# Patient Record
Sex: Male | Born: 1955
Health system: Southern US, Community
[De-identification: ages and names within clinical notes are randomized; demographics above are authoritative.]

## PROBLEM LIST (undated history)

## (undated) DIAGNOSIS — E119 Type 2 diabetes mellitus without complications: Secondary | ICD-10-CM

## (undated) DIAGNOSIS — T7840XA Allergy, unspecified, initial encounter: Secondary | ICD-10-CM

## (undated) DIAGNOSIS — E118 Type 2 diabetes mellitus with unspecified complications: Secondary | ICD-10-CM

## (undated) DIAGNOSIS — G4730 Sleep apnea, unspecified: Secondary | ICD-10-CM

## (undated) DIAGNOSIS — E785 Hyperlipidemia, unspecified: Secondary | ICD-10-CM

## (undated) DIAGNOSIS — I10 Essential (primary) hypertension: Secondary | ICD-10-CM

## (undated) HISTORY — DX: Allergy, unspecified, initial encounter: T78.40XA

## (undated) HISTORY — DX: Sleep apnea, unspecified: G47.30

## (undated) HISTORY — PX: KNEE ARTHROSCOPY: SUR90

## (undated) HISTORY — DX: Type 2 diabetes mellitus without complications: E11.9

## (undated) HISTORY — DX: Essential (primary) hypertension: I10

## (undated) HISTORY — DX: Hyperlipidemia, unspecified: E78.5

## (undated) HISTORY — PX: COLONOSCOPY: SHX174

---

## 1985-01-17 HISTORY — PX: KNEE ARTHROSCOPY: SUR90

## 2005-04-16 ENCOUNTER — Ambulatory Visit: Payer: Self-pay | Admitting: Otolaryngology

## 2005-05-12 ENCOUNTER — Ambulatory Visit: Payer: Self-pay | Admitting: Otolaryngology

## 2006-02-21 ENCOUNTER — Ambulatory Visit: Payer: Self-pay | Admitting: Gastroenterology

## 2006-03-13 ENCOUNTER — Ambulatory Visit: Payer: Self-pay | Admitting: Gastroenterology

## 2009-02-10 ENCOUNTER — Encounter (INDEPENDENT_AMBULATORY_CARE_PROVIDER_SITE_OTHER): Payer: Self-pay | Admitting: *Deleted

## 2009-09-15 ENCOUNTER — Telehealth: Payer: Self-pay | Admitting: Gastroenterology

## 2010-02-11 ENCOUNTER — Encounter: Payer: Self-pay | Admitting: Gastroenterology

## 2010-02-16 NOTE — Letter (Signed)
Summary: Colonoscopy Letter  [facility]  [address] [place], [place] [address]   Phone: [phone]  Fax: [phone]      February 10, 2009 MRN: [id number]   [facility] [address] [place], [place]  [address]   Dear Philip Ryan,   According to your medical record, it is time for you to schedule a Colonoscopy. The American Cancer Society recommends this procedure as a method to detect early colon cancer. Patients with a family history of colon cancer, or a personal history of colon polyps or inflammatory bowel disease are at increased risk.  This letter has beeen generated based on the recommendations made at the time of your procedure. If you feel that in your particular situation this may no longer apply, please contact our office.  Please call our office at [phone] to schedule this appointment or to update your records at your earliest convenience.  Thank you for cooperating with [place] to provide you with the very best care possible.   Sincerely,  Rachael Fee, M.D.  [facility] Gastroenterology Division [phone]

## 2010-02-16 NOTE — Progress Notes (Addendum)
Summary: Schedule Colonoscopy  Phone Note Outgoing Call Call back at [facility] Phone [phone] Call back at Work Phone [phone]   Call placed by: Harlow Mares CMA Duncan Dull),  September 15, 2009 9:06 AM Call placed to: Patient Summary of Call: Left a message on patients machine to call back, patient is due for his Colonoscopy Initial call taken by: Harlow Mares CMA Duncan Dull),  September 15, 2009 9:07 AM  Follow-up for Phone Call        patient will call back when he has his schedule in front of him he is at work.  Follow-up by: Harlow Mares CMA Duncan Dull),  September 24, 2009 2:39 PM     Appended Document: Schedule Colonoscopy I SCHED THIS GUY 3-13

## 2010-02-18 NOTE — Letter (Signed)
Summary: Pre Visit Letter Revised  [facility]  [address] [place], [place] [address]   Phone: [phone]  Fax: [phone]        02/11/2010 MRN: [id number]  [facility] [address] [place], [place]  [address]             Procedure Date:  3-13 8:30am            Dr Christella Hartigan - Recall Colon   Welcome to the Gastroenterology Division at [facility].    You are scheduled to see a nurse for your pre-procedure visit on 03-15-10 at 11am on the 3rd floor at [facility], [address]  We ask that you try to arrive at our office 15 minutes prior to your appointment time to allow for check-in.  Please take a minute to review the attached form.  If you answer "Yes" to one or more of the questions on the first page, we ask that you call the person listed at your earliest opportunity.  If you answer "No" to all of the questions, please complete the rest of the form and bring it to your appointment.    Your nurse visit will consist of discussing your medical and surgical history, your immediate family medical history, and your medications.   If you are unable to list all of your medications on the form, please bring the medication bottles to your appointment and we will list them.  We will need to be aware of both prescribed and over the counter drugs.  We will need to know exact dosage information as well.    Please be prepared to read and sign documents such as consent forms, a financial agreement, and acknowledgement forms.  If necessary, and with your consent, a friend or relative is welcome to sit-in on the nurse visit with you.  Please bring your insurance card so that we may make a copy of it.  If your insurance requires a referral to see a specialist, please bring your referral form from your primary care physician.  No co-pay is required for this nurse visit.     If you cannot keep your appointment, please call [phone] to cancel or reschedule prior  to your appointment date.  This allows [place] the opportunity to schedule an appointment for another patient in need of care.    Thank you for choosing [facility] for your medical needs.  We appreciate the opportunity to care for you.  Please visit [place] at our website  to learn more about our practice.  Sincerely, The Gastroenterology Division

## 2010-03-12 ENCOUNTER — Encounter (INDEPENDENT_AMBULATORY_CARE_PROVIDER_SITE_OTHER): Payer: Self-pay | Admitting: *Deleted

## 2010-03-15 ENCOUNTER — Encounter: Payer: Self-pay | Admitting: Gastroenterology

## 2010-03-25 NOTE — Letter (Signed)
Summary: [facility] Instructions  [facility]  [address] [place], [place] [address]   Phone: [phone]  Fax: [phone]       Philip Ryan    Oct 07, 1955    MRN: [id number]        Procedure Day /Date: Tuesday 03-30-10     Arrival Time: 7:30 am     Procedure Time: 8:30 am     Location of Procedure:                    _x _  [facility] (4th Floor)                        PREPARATION FOR COLONOSCOPY WITH MOVIPREP   Starting 5 days prior to your procedure  03-25-10 do not eat nuts, seeds, popcorn, corn, beans, peas,  salads, or any raw vegetables.  Do not take any fiber supplements (e.g. Metamucil, Citrucel, and Benefiber).  THE DAY BEFORE YOUR PROCEDURE         DATE: 03-29-10   DAY: Monday   1.  Drink clear liquids the entire day-NO SOLID FOOD  2.  Do not drink anything colored red or purple.  Avoid juices with pulp.  No orange juice.  3.  Drink at least 64 oz. (8 glasses) of fluid/clear liquids during the day to prevent dehydration and help the prep work efficiently.  CLEAR LIQUIDS INCLUDE: Water Jello Ice Popsicles Tea (sugar ok, no milk/cream) Powdered fruit flavored drinks Coffee (sugar ok, no milk/cream) Gatorade Juice: apple, white grape, white cranberry  Lemonade Clear bullion, consomm, broth Carbonated beverages (any kind) Strained chicken noodle soup Hard Candy                             4.  In the morning, mix first dose of MoviPrep solution:    Empty 1 Pouch A and 1 Pouch B into the disposable container    Add lukewarm drinking water to the top line of the container. Mix to dissolve    Refrigerate (mixed solution should be used within 24 hrs)  5.  Begin drinking the prep at 5:00 p.m. The MoviPrep container is divided by 4 marks.   Every 15 minutes drink the solution down to the next mark (approximately 8 oz) until the full liter is complete.   6.  Follow completed prep with 16 oz of clear liquid of your choice  (Nothing red or purple).  Continue to drink clear liquids until bedtime.  7.  Before going to bed, mix second dose of MoviPrep solution:    Empty 1 Pouch A and 1 Pouch B into the disposable container    Add lukewarm drinking water to the top line of the container. Mix to dissolve    Refrigerate  THE DAY OF YOUR PROCEDURE      DATE:  03-30-10  DAY:  Tuesday  Beginning at  3:30 a.m. (5 hours before procedure):         1. Every 15 minutes, drink the solution down to the next mark (approx 8 oz) until the full liter is complete.  2. Follow completed prep with 16 oz. of clear liquid of your choice.    3. You may drink clear liquids until  6:30 a.m. (2 HOURS BEFORE PROCEDURE).   MEDICATION INSTRUCTIONS  Unless otherwise instructed, you should take regular prescription medications with a small sip of water  as early as possible the morning of your procedure.   Additional medication instructions: Hold Micardis HCT the morning of procedure.         OTHER INSTRUCTIONS  You will need a responsible adult at least 54 years of age to accompany you and drive you home.   This person must remain in the waiting room during your procedure.  Wear loose fitting clothing that is easily removed.  Leave jewelry and other valuables at home.  However, you may wish to bring a book to read or  an iPod/MP3 player to listen to music as you wait for your procedure to start.  Remove all body piercing jewelry and leave at home.  Total time from sign-in until discharge is approximately 2-3 hours.  You should go home directly after your procedure and rest.  You can resume normal activities the  day after your procedure.  The day of your procedure you should not:   Drive   Make legal decisions   Operate machinery   Drink alcohol   Return to work  You will receive specific instructions about eating, activities and medications before you leave.    The above instructions have been reviewed  and explained to me by   Wyona Almas RN  March 15, 2010 11:24 AM     I fully understand and can verbalize these instructions _____________________________ Date _________

## 2010-03-25 NOTE — Miscellaneous (Signed)
Summary: [facility] Previsit/prep  Clinical Lists Changes  Medications: Added new medication of MOVIPREP 100 GM  SOLR (PEG-KCL-NACL-NASULF-NA ASC-C) As per prep instructions. - Signed Rx of MOVIPREP 100 GM  SOLR (PEG-KCL-NACL-NASULF-NA ASC-C) As per prep instructions.;  #1 x 0;  Signed;  Entered by: Wyona Almas RN;  Authorized by: Rachael Fee MD;  Method used: Electronically to [facility] [phone]*, [address] [place], [place], [place]  [address], Ph: [phone] or [phone], Fax: [phone] Observations: Added new observation of NKA: T (03/15/2010 10:44)    Prescriptions: MOVIPREP 100 GM  SOLR (PEG-KCL-NACL-NASULF-NA ASC-C) As per prep instructions.  #1 x 0   Entered by:   Wyona Almas RN   Authorized by:   Rachael Fee MD   Signed by:   Wyona Almas RN on 03/15/2010   Method used:   Electronically to        [facility] [phone]* (retail)       [address] [place], [place]  [address]       Ph: [phone] or [phone]       Fax: [phone]   RxID:   [id number]

## 2010-03-30 ENCOUNTER — Other Ambulatory Visit (AMBULATORY_SURGERY_CENTER): Payer: BC Managed Care – PPO | Admitting: Gastroenterology

## 2010-03-30 ENCOUNTER — Other Ambulatory Visit: Payer: Self-pay | Admitting: Gastroenterology

## 2010-03-30 DIAGNOSIS — Z1211 Encounter for screening for malignant neoplasm of colon: Secondary | ICD-10-CM

## 2010-03-30 DIAGNOSIS — K5730 Diverticulosis of large intestine without perforation or abscess without bleeding: Secondary | ICD-10-CM

## 2010-03-30 DIAGNOSIS — D126 Benign neoplasm of colon, unspecified: Secondary | ICD-10-CM

## 2010-03-30 DIAGNOSIS — Z86010 Personal history of colonic polyps: Secondary | ICD-10-CM

## 2010-04-06 ENCOUNTER — Encounter: Payer: Self-pay | Admitting: Gastroenterology

## 2010-04-06 NOTE — Procedures (Addendum)
Summary: Colonoscopy  Patient: Philip Ryan Note: All result statuses are Final unless otherwise noted.  Tests: (1) Colonoscopy (COL)   COL Colonoscopy           DONE     [facility]     [address]     [place], [place]  [address]          COLONOSCOPY PROCEDURE REPORT          PATIENT:  Philip, Ryan  MR#:  [id number]     BIRTHDATE:  [birth year], 55 yrs. old  GENDER:  male     ENDOSCOPIST:  Rachael Fee, MD     PROCEDURE DATE:  03/30/2010     PROCEDURE:  Colonoscopy with snare polypectomy     ASA CLASS:  Class II     INDICATIONS:  history of pre-cancerous (adenomatous) colon polyps,     2008 colonoscopy     MEDICATIONS:   Fentanyl 50 mcg IV, Versed 6 mg IV          DESCRIPTION OF PROCEDURE:   After the risks benefits and     alternatives of the procedure were thoroughly explained, informed     consent was obtained.  Digital rectal exam was performed and     revealed no rectal masses.   The LB [id number] endoscope     was introduced through the anus and advanced to the cecum, which     was identified by both the appendix and ileocecal valve, without     limitations.  The quality of the prep was good, using MiraLax.     The instrument was then slowly withdrawn as the colon was fully     examined.     <<PROCEDUREIMAGES>>     FINDINGS:  Three diminutive polyps were found, all were removed     with cold snare, all were sent to pathology (jar 1). These were     located in ascending, transverse and descending segments (see     image5).  Mild diverticulosis was found in the sigmoid to     descending colon segments (see image1).  This was otherwise a     normal examination of the colon (see image3, image4, and image6).     Retroflexed views in the rectum revealed no abnormalities.    The     scope was then withdrawn from the patient and the procedure     completed.     COMPLICATIONS:  None          ENDOSCOPIC IMPRESSION:     1) Three diminutive  polyps found, all removed and all sent to     pathology     2) Mild diverticulosis in the sigmoid to descending colon     segments     3) Otherwise normal examination          RECOMMENDATIONS:     1) If the polyps removed today are proven to be adenomatous     (pre-cancerous) polyps, you will need a colonoscopy in 3 years.     You will at least need a repeat examination in 5 years given your     personal history of adenomatous polyps (2008)     2) You will receive a letter within 1-2 weeks with the results     of your biopsy as well as final recommendations. Please call my     office if you have not received a letter after 3 weeks.  ______________________________     Rachael Fee, MD          cc: Marlou Starks, MD          n.     eSIGNED:   Rachael Fee at 03/30/2010 08:51 AM          Joycie Peek, [id number]  Note: An exclamation mark (!) indicates a result that was not dispersed into the flowsheet. Document Creation Date: 03/30/2010 8:51 AM _______________________________________________________________________  (1) Order result status: Final Collection or observation date-time: 03/30/2010 08:44 Requested date-time:  Receipt date-time:  Reported date-time:  Referring Physician:   Ordering Physician: Rob Bunting [phone]) Specimen Source:  Source: Launa Grill Order Number: [id number] Lab site:   Appended Document: Colonoscopy     Procedures Next Due Date:    Colonoscopy: 03/2013

## 2010-04-15 NOTE — Letter (Signed)
Summary: Results Letter  [facility]  [address] [place], [place] [address]   Phone: [phone]  Fax: [phone]        April 06, 2010 MRN: [id number]    [facility] [address] [place], [place]  [address]    Dear Philip Ryan,   The polyp(s) removed during your recent procedure were proven to be adenomatous.  These are pre-cancerous polyps that may have grown into cancers if they had not been removed.  Based on current nationally recognized surveillance guidelines, I recommend that you have a repeat colonoscopy in 3 years.   We will therefore put your information in our reminder system and will contact you in 3 years to schedule a repeat procedure.  Please call if you have any questions or concerns.       Sincerely,  Rachael Fee MD  This letter has been electronically signed by your physician.  Appended Document: Results Letter letter mailed

## 2011-08-10 ENCOUNTER — Ambulatory Visit: Payer: Self-pay | Admitting: Family Medicine

## 2011-08-18 ENCOUNTER — Ambulatory Visit: Payer: Self-pay | Admitting: Family Medicine

## 2013-02-08 ENCOUNTER — Encounter: Payer: Self-pay | Admitting: Gastroenterology

## 2013-09-19 ENCOUNTER — Encounter: Payer: Self-pay | Admitting: Gastroenterology

## 2013-12-16 ENCOUNTER — Encounter: Payer: Self-pay | Admitting: Gastroenterology

## 2014-02-24 ENCOUNTER — Ambulatory Visit (AMBULATORY_SURGERY_CENTER): Payer: Self-pay | Admitting: *Deleted

## 2014-02-24 VITALS — Ht 70.5 in | Wt 225.0 lb

## 2014-02-24 DIAGNOSIS — Z86010 Personal history of colonic polyps: Secondary | ICD-10-CM

## 2014-02-24 MED ORDER — MOVIPREP 100 G PO SOLR: ORAL | Status: DC

## 2014-02-24 NOTE — Progress Notes (Signed)
Patient denies any allergies to eggs or soy. Patient denies any problems with anesthesia/sedation. Patient denies any oxygen use at home and does not take any diet/weight loss medications. Patient does not want EMMI information at this time.

## 2014-03-10 ENCOUNTER — Ambulatory Visit (AMBULATORY_SURGERY_CENTER): Payer: BLUE CROSS/BLUE SHIELD | Admitting: Gastroenterology

## 2014-03-10 ENCOUNTER — Encounter: Payer: Self-pay | Admitting: Gastroenterology

## 2014-03-10 VITALS — BP 136/83 | HR 92 | Temp 96.8°F | Resp 14 | Ht 70.5 in | Wt 225.0 lb

## 2014-03-10 DIAGNOSIS — Z86010 Personal history of colonic polyps: Secondary | ICD-10-CM

## 2014-03-10 DIAGNOSIS — D124 Benign neoplasm of descending colon: Secondary | ICD-10-CM

## 2014-03-10 MED ORDER — SODIUM CHLORIDE 0.9 % IV SOLN: 500.0000 mL | INTRAVENOUS | Status: DC

## 2014-03-10 NOTE — Patient Instructions (Signed)
YOU HAD AN ENDOSCOPIC PROCEDURE TODAY AT THE [facility]: Refer to the procedure report that was given to you for any specific questions about what was found during the examination.  If the procedure report does not answer your questions, please call your gastroenterologist to clarify.  If you requested that your care partner not be given the details of your procedure findings, then the procedure report has been included in a sealed envelope for you to review at your convenience later.  YOU SHOULD EXPECT: Some feelings of bloating in the abdomen. Passage of more gas than usual.  Walking can help get rid of the air that was put into your GI tract during the procedure and reduce the bloating. If you had a lower endoscopy (such as a colonoscopy or flexible sigmoidoscopy) you may notice spotting of blood in your stool or on the toilet paper. If you underwent a bowel prep for your procedure, then you may not have a normal bowel movement for a few days.  DIET: Your first meal following the procedure should be a light meal and then it is ok to progress to your normal diet.  A half-sandwich or bowl of soup is an example of a good first meal.  Heavy or fried foods are harder to digest and may make you feel nauseous or bloated.  Likewise meals heavy in dairy and vegetables can cause extra gas to form and this can also increase the bloating.  Drink plenty of fluids but you should avoid alcoholic beverages for 24 hours.  ACTIVITY: Your care partner should take you home directly after the procedure.  You should plan to take it easy, moving slowly for the rest of the day.  You can resume normal activity the day after the procedure however you should NOT DRIVE or use heavy machinery for 24 hours (because of the sedation medicines used during the test).    SYMPTOMS TO REPORT IMMEDIATELY: A gastroenterologist can be reached at any hour.  During normal business hours, 8:30 AM to 5:00 PM Monday through Friday,  call [phone].  After hours and on weekends, please call the GI answering service at [phone] who will take a message and have the physician on call contact you.   Following lower endoscopy (colonoscopy or flexible sigmoidoscopy):  Excessive amounts of blood in the stool  Significant tenderness or worsening of abdominal pains  Swelling of the abdomen that is new, acute  Fever of 100F or higher  FOLLOW UP: If any biopsies were taken you will be contacted by phone or by letter within the next 1-3 weeks.  Call your gastroenterologist if you have not heard about the biopsies in 3 weeks.  Our staff will call the home number listed on your records the next business day following your procedure to check on you and address any questions or concerns that you may have at that time regarding the information given to you following your procedure. This is a courtesy call and so if there is no answer at the home number and we have not heard from you through the emergency physician on call, we will assume that you have returned to your regular daily activities without incident.  SIGNATURES/CONFIDENTIALITY: You and/or your care partner have signed paperwork which will be entered into your electronic medical record.  These signatures attest to the fact that that the information above on your After Visit Summary has been reviewed and is understood.  Full responsibility of the confidentiality of this   discharge information lies with you and/or your care-partner.  Polyps-handout given

## 2014-03-10 NOTE — Op Note (Signed)
[place]  [address] [place], [id number]   COLONOSCOPY PROCEDURE REPORT  PATIENT: Philip Ryan  MR#: [id number] BIRTHDATE: 04/19/1955 , 59  yrs. old GENDER: male ENDOSCOPIST: Milus Banister PROCEDURE DATE:  03/10/2014 PROCEDURE:   Colonoscopy with snare polypectomy First Screening Colonoscopy - Avg.  risk and is 50 yrs.  old or older - No.  Prior Negative Screening - Now for repeat screening. N/A  History of Adenoma - Now for follow-up colonoscopy & has been > or = to 3 yrs.  Yes hx of adenoma.  Has been 3 or more years since last colonoscopy.  Polyps Removed Today? Yes. ASA CLASS:   Class II INDICATIONS:adenomatous polyps including 3 small adenomas removed 2013. MEDICATIONS: Monitored anesthesia care, Propofol 200 mg IV, and lidocaine 200mg  IV  DESCRIPTION OF PROCEDURE:   After the risks benefits and alternatives of the procedure were thoroughly explained, informed consent was obtained.  The digital rectal exam revealed no abnormalities of the rectum.   The LB [id number]  endoscope was introduced through the anus and advanced to the cecum, which was identified by both the appendix and ileocecal valve. No adverse events experienced.   The quality of the prep was excellent.  The instrument was then slowly withdrawn as the colon was fully examined.      COLON FINDINGS: One polyp was found, removed and sent to pathology. This was 60mm, sessile, located in desceding colon, removed with cold snare.  The examination was otherwise normal.  Retroflexed views revealed no abnormalities. The time to cecum=3 minutes 57 seconds.  Withdrawal time=10 minutes 51 seconds.  The scope was withdrawn and the procedure completed. COMPLICATIONS: There were no immediate complications.  ENDOSCOPIC IMPRESSION: As above  RECOMMENDATIONS: If the polyp(s) removed today are proven to be adenomatous (pre-cancerous) polyps, you will need a repeat colonoscopy  in 5 years.   You will receive a letter within 1-2 weeks with the results of your biopsy as well as final recommendations.  Please call my office if you have not received a letter after 3 weeks.  eSigned:  Milus Banister, MD 03/10/2014 9:00 AM   cc: Golden Pop, MD

## 2014-03-10 NOTE — Op Note (Signed)
[facility]  [address] [place], [id number]   COLONOSCOPY PROCEDURE REPORT  PATIENT: Philip Ryan, Philip Ryan  MR#: [id number] BIRTHDATE: 09-Nov-1955 , 64  yrs. old GENDER: male ENDOSCOPIST: Milus Banister, MD REFERRED EX:HBZJIRC Raymon Mutton, M.D. PROCEDURE DATE:  03/10/2014 PROCEDURE:   Colonoscopy with biopsy First Screening Colonoscopy - Avg.  risk and is 50 yrs.  old or older Yes.  Prior Negative Screening - Now for repeat screening. N/A  History of Adenoma - Now for follow-up colonoscopy & has been > or = to 3 yrs.  N/A  Polyps Removed Today? Yes. ASA CLASS:   Class II INDICATIONS:average risk patient for colon cancer. MEDICATIONS: Monitored anesthesia care and Propofol 200 mg IV  DESCRIPTION OF PROCEDURE:   After the risks benefits and alternatives of the procedure were thoroughly explained, informed consent was obtained.  The digital rectal exam revealed no abnormalities of the rectum.   The LB [id number]  endoscope was introduced through the anus and advanced to the cecum, which was identified by both the appendix and ileocecal valve. No adverse events experienced.   The quality of the prep was excellent.  The instrument was then slowly withdrawn as the colon was fully examined.   COLON FINDINGS: One polyp was found, removed and sent to pathology. This was sessile, 81mm across, located in cecum, removed with biopsy.  The examination was otherwise normal.  Retroflexed views revealed no abnormalities. The time to cecum=2 minutes 24 seconds. Withdrawal time=6 minutes 42 seconds.  The scope was withdrawn and the procedure completed. COMPLICATIONS: There were no immediate complications.  ENDOSCOPIC IMPRESSION: One polyp was found, removed and sent to pathology. The examination was otherwise normal  RECOMMENDATIONS: If the polyp(s) removed today are proven to be adenomatous (pre-cancerous) polyps, you will need a repeat colonoscopy in 5 years.  Otherwise  you should continue to follow colorectal cancer screening guidelines for "routine risk" patients with colonoscopy in 10 years.  You will receive a letter within 1-2 weeks with the results of your biopsy as well as final recommendations.  Please call my office if you have not received a letter after 3 weeks.  eSigned:  Milus Banister, MD 03/10/2014 8:15 AM

## 2014-03-10 NOTE — Progress Notes (Signed)
Called to room to assist during endoscopic procedure.  Patient ID and intended procedure confirmed with present staff. Received instructions for my participation in the procedure from the performing physician.  

## 2014-03-10 NOTE — Progress Notes (Signed)
Report to PACU, RN, vss, BBS= Clear.  

## 2014-03-11 ENCOUNTER — Telehealth: Payer: Self-pay

## 2014-03-11 NOTE — Telephone Encounter (Signed)
  Follow up Call-  Call back number 03/10/2014  Post procedure Call Back phone  # [phone]  Permission to leave phone message Yes     Patient questions:  Do you have a fever, pain , or abdominal swelling? No. Pain Score  0 *  Have you tolerated food without any problems? Yes.    Have you been able to return to your normal activities? Yes.    Do you have any questions about your discharge instructions: Diet   No. Medications  No. Follow up visit  No.  Do you have questions or concerns about your Care? No.  Actions: * If pain score is 4 or above: No action needed, pain <4.

## 2014-03-18 ENCOUNTER — Encounter: Payer: Self-pay | Admitting: Gastroenterology

## 2014-07-23 DIAGNOSIS — G4730 Sleep apnea, unspecified: Secondary | ICD-10-CM

## 2014-07-23 DIAGNOSIS — E119 Type 2 diabetes mellitus without complications: Secondary | ICD-10-CM

## 2014-07-23 DIAGNOSIS — J309 Allergic rhinitis, unspecified: Secondary | ICD-10-CM

## 2014-07-23 DIAGNOSIS — E785 Hyperlipidemia, unspecified: Secondary | ICD-10-CM

## 2014-07-23 DIAGNOSIS — E1169 Type 2 diabetes mellitus with other specified complication: Secondary | ICD-10-CM | POA: Insufficient documentation

## 2014-07-23 DIAGNOSIS — I10 Essential (primary) hypertension: Secondary | ICD-10-CM

## 2014-07-28 ENCOUNTER — Ambulatory Visit (INDEPENDENT_AMBULATORY_CARE_PROVIDER_SITE_OTHER): Payer: BLUE CROSS/BLUE SHIELD | Admitting: Family Medicine

## 2014-07-28 ENCOUNTER — Encounter: Payer: Self-pay | Admitting: Family Medicine

## 2014-07-28 VITALS — BP 118/76 | HR 120 | Temp 98.6°F | Ht 70.0 in | Wt 220.0 lb

## 2014-07-28 DIAGNOSIS — I10 Essential (primary) hypertension: Secondary | ICD-10-CM

## 2014-07-28 DIAGNOSIS — E119 Type 2 diabetes mellitus without complications: Secondary | ICD-10-CM | POA: Diagnosis not present

## 2014-07-28 DIAGNOSIS — E785 Hyperlipidemia, unspecified: Secondary | ICD-10-CM

## 2014-07-28 LAB — LIPID PANEL PICCOLO, WAIVED
CHOL/HDL RATIO PICCOLO,WAIVE: 4.5 mg/dL
Cholesterol Piccolo, Waived: 192 mg/dL (ref <200)
HDL Chol Piccolo, Waived: 43 mg/dL — ABNORMAL LOW (ref >59)
LDL CHOL CALC PICCOLO WAIVED: 101 mg/dL — AB (ref <100)
Triglycerides Piccolo,Waived: 240 mg/dL — ABNORMAL HIGH (ref <150)
VLDL Chol Calc Piccolo,Waive: 48 mg/dL — ABNORMAL HIGH (ref <30)

## 2014-07-28 LAB — BAYER DCA HB A1C WAIVED: HB A1C (BAYER DCA - WAIVED): 7.2 % — ABNORMAL HIGH (ref <7.0)

## 2014-07-28 NOTE — Assessment & Plan Note (Addendum)
The current medical regimen is effective;  continue present plan and medications. Discuss needs better control will work on diet for control

## 2014-07-28 NOTE — Progress Notes (Signed)
   BP 118/76 mmHg  Pulse 120  Temp(Src) 98.6 F (37 C)  Ht 5\' 10"  (1.778 m)  Wt 220 lb (99.791 kg)  BMI 31.57 kg/m2  SpO2 96%   Subjective:    Patient ID: Philip Ryan., male    DOB: 03-14-1955, 59 y.o.   MRN: [id number]  HPI: Philip Ryan. is a 59 y.o. male  Chief Complaint  Patient presents with  . Diabetes  . Hyperlipidemia    patient NOT fasting   doing well BP meds and others no side effects Takes every day Things are up a little as just got off vacation  Relevant past medical, surgical, family and social history reviewed and updated as indicated. Interim medical history since our last visit reviewed. Allergies and medications reviewed and updated.  Review of Systems  Constitutional: Negative.   Respiratory: Negative.   Cardiovascular: Negative.   Musculoskeletal:       No edema    Per HPI unless specifically indicated above     Objective:    BP 118/76 mmHg  Pulse 120  Temp(Src) 98.6 F (37 C)  Ht 5\' 10"  (1.778 m)  Wt 220 lb (99.791 kg)  BMI 31.57 kg/m2  SpO2 96%  Wt Readings from Last 3 Encounters:  07/28/14 220 lb (99.791 kg)  04/29/14 216 lb (97.977 kg)  03/10/14 225 lb (102.059 kg)    Physical Exam  Constitutional: He is oriented to person, place, and time. He appears well-developed and well-nourished. No distress.  HENT:  Head: Normocephalic and atraumatic.  Right Ear: Hearing normal.  Left Ear: Hearing normal.  Nose: Nose normal.  Eyes: Conjunctivae and lids are normal. Right eye exhibits no discharge. Left eye exhibits no discharge. No scleral icterus.  Cardiovascular: Normal rate, regular rhythm and normal heart sounds.   Pulmonary/Chest: Effort normal and breath sounds normal. No respiratory distress.  Musculoskeletal: Normal range of motion.  Neurological: He is alert and oriented to person, place, and time.  Skin: Skin is intact. No rash noted.  Psychiatric: He has a normal mood and affect. His speech is normal and  behavior is normal. Judgment and thought content normal. Cognition and memory are normal.    No results found for this or any previous visit.    Assessment & Plan:   Problem List Items Addressed This Visit      Cardiovascular and Mediastinum   Hypertension    The current medical regimen is effective;  continue present plan and medications.         Endocrine   Diabetes mellitus    The current medical regimen is effective;  continue present plan and medications. Discuss needs better control will work on diet for control        Other Visit Diagnoses    Diabetes mellitus without complication    -  Primary    Relevant Orders    Bayer DCA Hb A1c Waived    Hyperlipemia        Relevant Orders    Lipid Panel Piccolo, Waived        Follow up plan: Return in about 3 months (around 10/28/2014) for DM and check  A1C.

## 2014-07-28 NOTE — Assessment & Plan Note (Signed)
The current medical regimen is effective;  continue present plan and medications.  

## 2014-09-10 ENCOUNTER — Other Ambulatory Visit: Payer: Self-pay | Admitting: Family Medicine

## 2014-09-10 ENCOUNTER — Ambulatory Visit (INDEPENDENT_AMBULATORY_CARE_PROVIDER_SITE_OTHER): Payer: BLUE CROSS/BLUE SHIELD

## 2014-09-10 ENCOUNTER — Telehealth: Payer: Self-pay | Admitting: Family Medicine

## 2014-09-10 DIAGNOSIS — J301 Allergic rhinitis due to pollen: Secondary | ICD-10-CM

## 2014-09-10 DIAGNOSIS — J302 Other seasonal allergic rhinitis: Secondary | ICD-10-CM

## 2014-09-10 MED ORDER — TRIAMCINOLONE ACETONIDE 40 MG/ML IJ SUSP: 40.0000 mg | Freq: Once | INTRAMUSCULAR | Status: DC

## 2014-09-10 MED ORDER — TRIAMCINOLONE ACETONIDE 40 MG/ML IJ SUSP
40.0000 mg | Freq: Once | INTRAMUSCULAR | Status: AC
  Administered 2014-09-10: 40 mg via INTRAMUSCULAR

## 2014-09-10 NOTE — Telephone Encounter (Signed)
Do we?

## 2014-09-10 NOTE — Telephone Encounter (Signed)
Pt added to schedule today @ 1pm

## 2014-09-10 NOTE — Telephone Encounter (Signed)
Pt called stated he is due for his allergy shot, wants to know if we have it in stock. Please advise and I will call pt back. Thanks.

## 2014-10-01 LAB — HM DIABETES EYE EXAM

## 2014-10-28 ENCOUNTER — Encounter: Payer: Self-pay | Admitting: Family Medicine

## 2014-10-28 ENCOUNTER — Ambulatory Visit (INDEPENDENT_AMBULATORY_CARE_PROVIDER_SITE_OTHER): Payer: BLUE CROSS/BLUE SHIELD | Admitting: Family Medicine

## 2014-10-28 VITALS — BP 128/78 | HR 86 | Temp 99.1°F | Ht 71.0 in | Wt 217.0 lb

## 2014-10-28 DIAGNOSIS — E785 Hyperlipidemia, unspecified: Secondary | ICD-10-CM

## 2014-10-28 DIAGNOSIS — E119 Type 2 diabetes mellitus without complications: Secondary | ICD-10-CM

## 2014-10-28 DIAGNOSIS — I10 Essential (primary) hypertension: Secondary | ICD-10-CM

## 2014-10-28 DIAGNOSIS — Z23 Encounter for immunization: Secondary | ICD-10-CM | POA: Diagnosis not present

## 2014-10-28 LAB — BAYER DCA HB A1C WAIVED: HB A1C (BAYER DCA - WAIVED): 7 % — ABNORMAL HIGH (ref <7.0)

## 2014-10-28 MED ORDER — TELMISARTAN 80 MG PO TABS
80.0000 mg | ORAL_TABLET | Freq: Every day | ORAL | Status: DC
Start: 2014-10-28 — End: 2015-06-11

## 2014-10-28 MED ORDER — AMLODIPINE BESYLATE 10 MG PO TABS: 10.0000 mg | ORAL_TABLET | Freq: Every day | ORAL | Status: DC

## 2014-10-28 MED ORDER — METFORMIN HCL 500 MG PO TABS: 1000.0000 mg | ORAL_TABLET | Freq: Two times a day (BID) | ORAL | Status: DC

## 2014-10-28 NOTE — Assessment & Plan Note (Signed)
The current medical regimen is effective;  continue present plan and medications.  

## 2014-10-28 NOTE — Progress Notes (Signed)
BP 128/78 mmHg  Pulse 86  Temp(Src) 99.1 F (37.3 C)  Ht 5\' 11"  (1.803 m)  Wt 217 lb (98.431 kg)  BMI 30.28 kg/m2  SpO2 98%   Subjective:    Patient ID: Philip Ryan., male    DOB: Nov 20, 1955, 59 y.o.   MRN: [id number]  HPI: Philip Ryan. is a 59 y.o. male  Chief Complaint  Patient presents with  . Diabetes   patient doing well with diabetes noted low blood sugar spells no complaints from medications  Blood pressure doing well with good control no side effects and takes faithfully.  Relevant past medical, surgical, family and social history reviewed and updated as indicated. Interim medical history since our last visit reviewed. Allergies and medications reviewed and updated.  Review of Systems  Constitutional: Negative.   Respiratory: Negative.   Cardiovascular: Negative.     Per HPI unless specifically indicated above     Objective:    BP 128/78 mmHg  Pulse 86  Temp(Src) 99.1 F (37.3 C)  Ht 5\' 11"  (1.803 m)  Wt 217 lb (98.431 kg)  BMI 30.28 kg/m2  SpO2 98%  Wt Readings from Last 3 Encounters:  10/28/14 217 lb (98.431 kg)  07/28/14 220 lb (99.791 kg)  04/29/14 216 lb (97.977 kg)    Physical Exam  Constitutional: He is oriented to person, place, and time. He appears well-developed and well-nourished. No distress.  HENT:  Head: Normocephalic and atraumatic.  Right Ear: Hearing normal.  Left Ear: Hearing normal.  Nose: Nose normal.  Eyes: Conjunctivae and lids are normal. Right eye exhibits no discharge. Left eye exhibits no discharge. No scleral icterus.  Cardiovascular: Normal rate, regular rhythm and normal heart sounds.   Pulmonary/Chest: Effort normal and breath sounds normal. No respiratory distress.  Musculoskeletal: Normal range of motion.  Neurological: He is alert and oriented to person, place, and time.  Skin: Skin is intact. No rash noted.  Psychiatric: He has a normal mood and affect. His speech is normal and behavior is  normal. Judgment and thought content normal. Cognition and memory are normal.    Results for orders placed or performed in visit on 07/28/14  Lipid Panel Piccolo, [employer]  Result Value Ref Range   Cholesterol Piccolo, Waived 192 <200 mg/dL   HDL Chol Piccolo, Waived 43 (L) >59 mg/dL   Triglycerides Piccolo,Waived 240 (H) <150 mg/dL   Chol/HDL Ratio Piccolo,Waive 4.5 mg/dL   LDL Chol Calc Piccolo Waived 101 (H) <100 mg/dL   VLDL Chol Calc Piccolo,Waive 48 (H) <30 mg/dL  Bayer DCA Hb A1c Waived  Result Value Ref Range   Bayer DCA Hb A1c Waived 7.2 (H) <7.0 %      Assessment & Plan:   Problem List Items Addressed This Visit      Cardiovascular and Mediastinum   Hypertension    The current medical regimen is effective;  continue present plan and medications.       Relevant Medications   aspirin EC 81 MG tablet   amLODipine (NORVASC) 10 MG tablet   telmisartan (MICARDIS) 80 MG tablet     Endocrine   Diabetes mellitus ([place])    The current medical regimen is effective;  continue present plan and medications.       Relevant Medications   aspirin EC 81 MG tablet   metFORMIN (GLUCOPHAGE) 500 MG tablet   telmisartan (MICARDIS) 80 MG tablet     Other   Hyperlipidemia    The current  medical regimen is effective;  continue present plan and medications.       Relevant Medications   aspirin EC 81 MG tablet   amLODipine (NORVASC) 10 MG tablet   telmisartan (MICARDIS) 80 MG tablet    Other Visit Diagnoses    Diabetes mellitus without complication (HCC)    -  Primary    Relevant Medications    aspirin EC 81 MG tablet    metFORMIN (GLUCOPHAGE) 500 MG tablet    telmisartan (MICARDIS) 80 MG tablet    Other Relevant Orders    Bayer DCA Hb A1c Waived    Immunization due        Relevant Orders    Flu Vaccine QUAD 36+ mos PF IM (Fluarix & Fluzone Quad PF) (Completed)        Follow up plan: Return in about 3 months (around 01/28/2015), or if symptoms worsen or fail to improve,  for a1c bmp.

## 2015-02-17 ENCOUNTER — Encounter: Payer: Self-pay | Admitting: Family Medicine

## 2015-02-17 ENCOUNTER — Ambulatory Visit (INDEPENDENT_AMBULATORY_CARE_PROVIDER_SITE_OTHER): Payer: BLUE CROSS/BLUE SHIELD | Admitting: Family Medicine

## 2015-02-17 VITALS — BP 131/86 | HR 92 | Temp 98.9°F | Ht 69.8 in | Wt 213.0 lb

## 2015-02-17 DIAGNOSIS — E785 Hyperlipidemia, unspecified: Secondary | ICD-10-CM

## 2015-02-17 DIAGNOSIS — I10 Essential (primary) hypertension: Secondary | ICD-10-CM | POA: Diagnosis not present

## 2015-02-17 DIAGNOSIS — E119 Type 2 diabetes mellitus without complications: Secondary | ICD-10-CM | POA: Diagnosis not present

## 2015-02-17 LAB — BAYER DCA HB A1C WAIVED: HB A1C: 8 % — AB (ref <7.0)

## 2015-02-17 NOTE — Assessment & Plan Note (Signed)
Discussed diabetes poor control today discussed diet exercise nutrition Patient will do better with lifestyle and if glucose not coming down we'll notify us before his physical in April for further medication.

## 2015-02-17 NOTE — Assessment & Plan Note (Signed)
The current medical regimen is effective;  continue present plan and medications.  

## 2015-02-17 NOTE — Progress Notes (Signed)
BP 131/86 mmHg  Pulse 92  Temp(Src) 98.9 F (37.2 C)  Ht 5' 9.8" (1.773 m)  Wt 213 lb (96.616 kg)  BMI 30.73 kg/m2  SpO2 98%   Subjective:    Patient ID: Philip Dunn., male    DOB: 17-May-1955, 60 y.o.   MRN: [id number]  HPI: Philip Ryan. is a 60 y.o. male  Chief Complaint  Patient presents with  . Hypertension  . Hyperlipidemia  . Diabetes   recheck medical problems doing well with blood pressure good control no complaints Cholesterol doing well Diabetes doing well not checking blood sugar had a big holiday season with eating extra but all in all is been trying to work on weight loss had gained some but is now back down 7 pounds from this summer. Taking medications faithfully with no side effects and noted low blood sugar spells. Relevant past medical, surgical, family and social history reviewed and updated as indicated. Interim medical history since our last visit reviewed. Allergies and medications reviewed and updated.  Review of Systems  Constitutional: Negative.   Respiratory: Negative.   Cardiovascular: Negative.     Per HPI unless specifically indicated above     Objective:    BP 131/86 mmHg  Pulse 92  Temp(Src) 98.9 F (37.2 C)  Ht 5' 9.8" (1.773 m)  Wt 213 lb (96.616 kg)  BMI 30.73 kg/m2  SpO2 98%  Wt Readings from Last 3 Encounters:  02/17/15 213 lb (96.616 kg)  10/28/14 217 lb (98.431 kg)  07/28/14 220 lb (99.791 kg)    Physical Exam  Constitutional: He is oriented to person, place, and time. He appears well-developed and well-nourished. No distress.  HENT:  Head: Normocephalic and atraumatic.  Right Ear: Hearing normal.  Left Ear: Hearing normal.  Nose: Nose normal.  Eyes: Conjunctivae and lids are normal. Right eye exhibits no discharge. Left eye exhibits no discharge. No scleral icterus.  Cardiovascular: Normal rate, regular rhythm and normal heart sounds.   Pulmonary/Chest: Effort normal and breath sounds normal. No  respiratory distress.  Musculoskeletal: Normal range of motion.  Neurological: He is alert and oriented to person, place, and time.  Skin: Skin is intact. No rash noted.  Psychiatric: He has a normal mood and affect. His speech is normal and behavior is normal. Judgment and thought content normal. Cognition and memory are normal.    Results for orders placed or performed in visit on 10/28/14  Bayer DCA Hb A1c Waived  Result Value Ref Range   Bayer DCA Hb A1c Waived 7.0 (H) <7.0 %      Assessment & Plan:   Problem List Items Addressed This Visit      Cardiovascular and Mediastinum   Hypertension    The current medical regimen is effective;  continue present plan and medications.         Endocrine   Diabetes mellitus ([place])    Discussed diabetes poor control today discussed diet exercise nutrition Patient will do better with lifestyle and if glucose not coming down we'll notify us before his physical in April for further medication.        Other   Hyperlipidemia    The current medical regimen is effective;  continue present plan and medications.        Other Visit Diagnoses    Diabetes mellitus without complication ([place])    -  Primary    Relevant Orders    Bayer DCA Hb A1c Waived  Essential hypertension, benign        Relevant Orders    Basic metabolic panel        Follow up plan: Return in about 3 months (around 05/17/2015) for Physical Exam and A1C.

## 2015-02-18 ENCOUNTER — Encounter: Payer: Self-pay | Admitting: Family Medicine

## 2015-02-18 LAB — BASIC METABOLIC PANEL
BUN/Creatinine Ratio: 13 (ref 9–20)
BUN: 10 mg/dL (ref 6–24)
CO2: 24 mmol/L (ref 18–29)
Calcium: 9.1 mg/dL (ref 8.7–10.2)
Chloride: 96 mmol/L (ref 96–106)
Creatinine, Ser: 0.76 mg/dL (ref 0.76–1.27)
GFR calc Af Amer: 116 mL/min/{1.73_m2} (ref >59)
GFR calc non Af Amer: 101 mL/min/{1.73_m2} (ref >59)
Glucose: 187 mg/dL — ABNORMAL HIGH (ref 65–99)
Potassium: 4.3 mmol/L (ref 3.5–5.2)
Sodium: 135 mmol/L (ref 134–144)

## 2015-04-30 ENCOUNTER — Encounter: Payer: BLUE CROSS/BLUE SHIELD | Admitting: Family Medicine

## 2015-05-26 ENCOUNTER — Ambulatory Visit (INDEPENDENT_AMBULATORY_CARE_PROVIDER_SITE_OTHER): Payer: BLUE CROSS/BLUE SHIELD | Admitting: Unknown Physician Specialty

## 2015-05-26 ENCOUNTER — Encounter: Payer: Self-pay | Admitting: Unknown Physician Specialty

## 2015-05-26 VITALS — BP 152/94 | HR 96 | Temp 98.2°F | Ht 69.6 in | Wt 217.4 lb

## 2015-05-26 DIAGNOSIS — J069 Acute upper respiratory infection, unspecified: Secondary | ICD-10-CM | POA: Diagnosis not present

## 2015-05-26 MED ORDER — FLUTICASONE PROPIONATE 50 MCG/ACT NA SUSP: 2.0000 | Freq: Every day | NASAL | Status: DC

## 2015-05-26 NOTE — Progress Notes (Signed)
+  BP 152/94 mmHg  Pulse 96  Temp(Src) 98.2 F (36.8 C)  Ht 5' 9.6" (1.768 m)  Wt 217 lb 6.4 oz (98.612 kg)  BMI 31.55 kg/m2  SpO2 96%   Subjective:    Patient ID: Philip Dunn., male    DOB: 12-17-1955, 60 y.o.   MRN: [id number]  HPI: Philip Goodlow. is a 60 y.o. male  Chief Complaint  Patient presents with  . URI    pt states he has had some congestion for about 2 weeks now and has tried many OTC medications   URI  This is a new problem. The current episode started 1 to 4 weeks ago. The problem has been gradually improving. There has been no fever. Associated symptoms include congestion, coughing and headaches. Pertinent negatives include no neck pain, sinus pain or sore throat. He has tried nothing for the symptoms.     Relevant past medical, surgical, family and social history reviewed and updated as indicated. Interim medical history since our last visit reviewed. Allergies and medications reviewed and updated.  Review of Systems  HENT: Positive for congestion. Negative for sore throat.   Respiratory: Positive for cough.   Musculoskeletal: Negative for neck pain.  Neurological: Positive for headaches.    Per HPI unless specifically indicated above     Objective:    BP 152/94 mmHg  Pulse 96  Temp(Src) 98.2 F (36.8 C)  Ht 5' 9.6" (1.768 m)  Wt 217 lb 6.4 oz (98.612 kg)  BMI 31.55 kg/m2  SpO2 96%  Wt Readings from Last 3 Encounters:  05/26/15 217 lb 6.4 oz (98.612 kg)  02/17/15 213 lb (96.616 kg)  10/28/14 217 lb (98.431 kg)    Physical Exam  Constitutional: He is oriented to person, place, and time. He appears well-developed and well-nourished. No distress.  HENT:  Head: Normocephalic and atraumatic.  Right Ear: Tympanic membrane and ear canal normal.  Left Ear: Tympanic membrane and ear canal normal.  Nose: Rhinorrhea present. Right sinus exhibits no maxillary sinus tenderness and no frontal sinus tenderness. Left sinus exhibits no  maxillary sinus tenderness and no frontal sinus tenderness.  Mouth/Throat: Uvula is midline. Posterior oropharyngeal edema present.  Eyes: Conjunctivae and lids are normal. Right eye exhibits no discharge. Left eye exhibits no discharge. No scleral icterus.  Neck: Neck supple.  Cardiovascular: Normal rate, regular rhythm and normal heart sounds.   Pulmonary/Chest: Effort normal and breath sounds normal. No respiratory distress.  Abdominal: Normal appearance. There is no splenomegaly or hepatomegaly.  Musculoskeletal: Normal range of motion.  Neurological: He is alert and oriented to person, place, and time.  Skin: Skin is warm, dry and intact. No rash noted. No pallor.  Psychiatric: He has a normal mood and affect. His behavior is normal. Judgment and thought content normal.  Nursing note and vitals reviewed.   Results for orders placed or performed in visit on 02/17/15  Bayer DCA Hb A1c Waived  Result Value Ref Range   Bayer DCA Hb A1c Waived 8.0 (H) Q000111Q %  Basic metabolic panel  Result Value Ref Range   Glucose 187 (H) 65 - 99 mg/dL   BUN 10 6 - 24 mg/dL   Creatinine, Ser 0.76 0.76 - 1.27 mg/dL   GFR calc non Af Amer 101 >59 mL/min/1.73   GFR calc Af Amer 116 >59 mL/min/1.73   BUN/Creatinine Ratio 13 9 - 20   Sodium 135 134 - 144 mmol/L   Potassium 4.3 3.5 -  5.2 mmol/L   Chloride 96 96 - 106 mmol/L   CO2 24 18 - 29 mmol/L   Calcium 9.1 8.7 - 10.2 mg/dL      Assessment & Plan:   Problem List Items Addressed This Visit    None    Visit Diagnoses    URI (upper respiratory infection)    -  Primary    supportive care.  Rx for flonase.  Pt ed.        Follow up plan: Return if symptoms worsen or fail to improve.

## 2015-05-26 NOTE — Patient Instructions (Signed)
Viral Infections °A viral infection can be caused by different types of viruses. Most viral infections are not serious and resolve on their own. However, some infections may cause severe symptoms and may lead to further complications. °SYMPTOMS °Viruses can frequently cause: °· Minor sore throat. °· Aches and pains. °· Headaches. °· Runny nose. °· Different types of rashes. °· Watery eyes. °· Tiredness. °· Cough. °· Loss of appetite. °· Gastrointestinal infections, resulting in nausea, vomiting, and diarrhea. °These symptoms do not respond to antibiotics because the infection is not caused by bacteria. However, you might catch a bacterial infection following the viral infection. This is sometimes called a "superinfection." Symptoms of such a bacterial infection may include: °· Worsening sore throat with pus and difficulty swallowing. °· Swollen neck glands. °· Chills and a high or persistent fever. °· Severe headache. °· Tenderness over the sinuses. °· Persistent overall ill feeling (malaise), muscle aches, and tiredness (fatigue). °· Persistent cough. °· Yellow, green, or brown mucus production with coughing. °HOME CARE INSTRUCTIONS  °· Only take over-the-counter or prescription medicines for pain, discomfort, diarrhea, or fever as directed by your caregiver. °· Drink enough water and fluids to keep your urine clear or pale yellow. Sports drinks can provide valuable electrolytes, sugars, and hydration. °· Get plenty of rest and maintain proper nutrition. Soups and broths with crackers or rice are fine. °SEEK IMMEDIATE MEDICAL CARE IF:  °· You have severe headaches, shortness of breath, chest pain, neck pain, or an unusual rash. °· You have uncontrolled vomiting, diarrhea, or you are unable to keep down fluids. °· You or your child has an oral temperature above 102° F (38.9° C), not controlled by medicine. °· Your baby is older than 3 months with a rectal temperature of 102° F (38.9° C) or higher. °· Your baby is 3  months old or younger with a rectal temperature of 100.4° F (38° C) or higher. °MAKE SURE YOU:  °· Understand these instructions. °· Will watch your condition. °· Will get help right away if you are not doing well or get worse. °  °This information is not intended to replace advice given to you by your health care provider. Make sure you discuss any questions you have with your health care provider. °  °Document Released: 10/13/2004 Document Revised: 03/28/2011 Document Reviewed: 06/11/2014 °Elsevier Interactive Patient Education ©2016 [employer] ° °

## 2015-06-11 ENCOUNTER — Ambulatory Visit (INDEPENDENT_AMBULATORY_CARE_PROVIDER_SITE_OTHER): Payer: BLUE CROSS/BLUE SHIELD | Admitting: Family Medicine

## 2015-06-11 ENCOUNTER — Encounter: Payer: Self-pay | Admitting: Family Medicine

## 2015-06-11 VITALS — BP 110/76 | HR 111 | Temp 98.2°F | Ht 70.2 in | Wt 215.0 lb

## 2015-06-11 DIAGNOSIS — Z0000 Encounter for general adult medical examination without abnormal findings: Secondary | ICD-10-CM | POA: Diagnosis not present

## 2015-06-11 DIAGNOSIS — I10 Essential (primary) hypertension: Secondary | ICD-10-CM | POA: Diagnosis not present

## 2015-06-11 DIAGNOSIS — E119 Type 2 diabetes mellitus without complications: Secondary | ICD-10-CM

## 2015-06-11 LAB — URINALYSIS, ROUTINE W REFLEX MICROSCOPIC
Bilirubin, UA: NEGATIVE
LEUKOCYTES UA: NEGATIVE
Nitrite, UA: NEGATIVE
PROTEIN UA: NEGATIVE
RBC, UA: NEGATIVE
Specific Gravity, UA: 1.015 (ref 1.005–1.030)
Urobilinogen, Ur: 0.2 mg/dL (ref 0.2–1.0)
pH, UA: 5 (ref 5.0–7.5)

## 2015-06-11 LAB — BAYER DCA HB A1C WAIVED: HB A1C: 8.2 % — AB (ref <7.0)

## 2015-06-11 MED ORDER — DAPAGLIFLOZIN PRO-METFORMIN ER 5-1000 MG PO TB24: 5.0000 | ORAL_TABLET | Freq: Two times a day (BID) | ORAL | Status: DC

## 2015-06-11 MED ORDER — AMLODIPINE BESYLATE 10 MG PO TABS: 10.0000 mg | ORAL_TABLET | Freq: Every day | ORAL | Status: DC

## 2015-06-11 MED ORDER — TELMISARTAN 80 MG PO TABS: 80.0000 mg | ORAL_TABLET | Freq: Every day | ORAL | Status: DC

## 2015-06-11 NOTE — Addendum Note (Signed)
Addended by: Wynn Maudlin on: 06/11/2015 02:40 PM   Modules accepted: Miquel Dunn

## 2015-06-11 NOTE — Progress Notes (Signed)
BP 110/76 mmHg  Pulse 111  Temp(Src) 98.2 F (36.8 C)  Ht 5' 10.2" (1.783 m)  Wt 215 lb (97.523 kg)  BMI 30.68 kg/m2  SpO2 98%   Subjective:    Patient ID: Philip Ryan., male    DOB: [birth year], 60 y.o.   MRN: [id number]  HPI: Philip Ryan. is a 60 y.o. male  Chief Complaint  Patient presents with  . Annual Exam  . Diabetes  Patient doing well diabetes had a big weekend last weekend but otherwise been doing okay blood sugars have remained elevated when checking noted noticed low blood sugar spells. Blood pressure doing well good control no issues with amlodipine and Micardis. Patient taken faithfully without side effects.   Relevant past medical, surgical, family and social history reviewed and updated as indicated. Interim medical history since our last visit reviewed. Allergies and medications reviewed and updated.  Review of Systems  Constitutional: Negative.   HENT: Negative.   Eyes: Negative.   Respiratory: Negative.   Cardiovascular: Negative.   Gastrointestinal: Negative.   Endocrine: Negative.   Genitourinary: Negative.   Musculoskeletal: Negative.   Skin: Negative.   Allergic/Immunologic: Negative.   Neurological: Negative.   Hematological: Negative.   Psychiatric/Behavioral: Negative.     Per HPI unless specifically indicated above     Objective:    BP 110/76 mmHg  Pulse 111  Temp(Src) 98.2 F (36.8 C)  Ht 5' 10.2" (1.783 m)  Wt 215 lb (97.523 kg)  BMI 30.68 kg/m2  SpO2 98%  Wt Readings from Last 3 Encounters:  06/11/15 215 lb (97.523 kg)  05/26/15 217 lb 6.4 oz (98.612 kg)  02/17/15 213 lb (96.616 kg)    Physical Exam  Constitutional: He is oriented to person, place, and time. He appears well-developed and well-nourished.  HENT:  Head: Normocephalic and atraumatic.  Right Ear: External ear normal.  Left Ear: External ear normal.  Eyes: Conjunctivae and EOM are normal. Pupils are equal, round, and reactive to light.   Neck: Normal range of motion. Neck supple.  Cardiovascular: Normal rate, regular rhythm, normal heart sounds and intact distal pulses.   Pulmonary/Chest: Effort normal and breath sounds normal.  Abdominal: Soft. Bowel sounds are normal. There is no splenomegaly or hepatomegaly.  Genitourinary: Rectum normal, prostate normal and penis normal.  Musculoskeletal: Normal range of motion.  Neurological: He is alert and oriented to person, place, and time. He has normal reflexes.  Skin: No rash noted. No erythema.  Psychiatric: He has a normal mood and affect. His behavior is normal. Judgment and thought content normal.    Results for orders placed or performed in visit on 02/17/15  Bayer DCA Hb A1c Waived  Result Value Ref Range   Bayer DCA Hb A1c Waived 8.0 (H) Q000111Q %  Basic metabolic panel  Result Value Ref Range   Glucose 187 (H) 65 - 99 mg/dL   BUN 10 6 - 24 mg/dL   Creatinine, Ser 0.76 0.76 - 1.27 mg/dL   GFR calc non Af Amer 101 >59 mL/min/1.73   GFR calc Af Amer 116 >59 mL/min/1.73   BUN/Creatinine Ratio 13 9 - 20   Sodium 135 134 - 144 mmol/L   Potassium 4.3 3.5 - 5.2 mmol/L   Chloride 96 96 - 106 mmol/L   CO2 24 18 - 29 mmol/L   Calcium 9.1 8.7 - 10.2 mg/dL      Assessment & Plan:   Problem List Items Addressed This Visit  Cardiovascular and Mediastinum   Hypertension    The current medical regimen is effective;  continue present plan and medications.       Relevant Medications   amLODipine (NORVASC) 10 MG tablet   telmisartan (MICARDIS) 80 MG tablet     Endocrine   Diabetes mellitus ([place])    Too high a1c will start farxiga      Relevant Medications   Dapagliflozin-Metformin HCl ER (XIGDUO XR) 05-998 MG TB24   telmisartan (MICARDIS) 80 MG tablet    Other Visit Diagnoses    Routine general medical examination at a health care facility    -  Primary    Relevant Orders    CBC with Differential/Platelet    Comprehensive metabolic panel    Lipid Panel w/o  Chol/HDL Ratio    PSA    TSH    Urinalysis, Routine w reflex microscopic (not at [facility])    Diabetes mellitus without complication ([facility])        Relevant Medications    Dapagliflozin-Metformin HCl ER (XIGDUO XR) 05-998 MG TB24    telmisartan (MICARDIS) 80 MG tablet    Other Relevant Orders    Bayer DCA Hb A1c Waived        Follow up plan: Return in about 3 months (around 09/11/2015) for a1c.

## 2015-06-11 NOTE — Assessment & Plan Note (Signed)
Too high a1c will start farxiga

## 2015-06-11 NOTE — Assessment & Plan Note (Signed)
The current medical regimen is effective;  continue present plan and medications.  

## 2015-06-12 LAB — CBC WITH DIFFERENTIAL/PLATELET
BASOS ABS: 0 10*3/uL (ref 0.0–0.2)
BASOS: 1 %
EOS (ABSOLUTE): 0.6 10*3/uL — AB (ref 0.0–0.4)
Eos: 8 %
Hematocrit: 43.5 % (ref 37.5–51.0)
Hemoglobin: 14.8 g/dL (ref 12.6–17.7)
IMMATURE GRANS (ABS): 0 10*3/uL (ref 0.0–0.1)
Immature Granulocytes: 0 %
LYMPHS: 22 %
Lymphocytes Absolute: 1.6 10*3/uL (ref 0.7–3.1)
MCH: 28.7 pg (ref 26.6–33.0)
MCHC: 34 g/dL (ref 31.5–35.7)
MCV: 85 fL (ref 79–97)
MONOS ABS: 0.4 10*3/uL (ref 0.1–0.9)
Monocytes: 5 %
NEUTROS ABS: 4.6 10*3/uL (ref 1.4–7.0)
NEUTROS PCT: 64 %
PLATELETS: 233 10*3/uL (ref 150–379)
RBC: 5.15 x10E6/uL (ref 4.14–5.80)
RDW: 13.7 % (ref 12.3–15.4)
WBC: 7.2 10*3/uL (ref 3.4–10.8)

## 2015-06-12 LAB — COMPREHENSIVE METABOLIC PANEL
A/G RATIO: 1.8 (ref 1.2–2.2)
ALK PHOS: 69 IU/L (ref 39–117)
ALT: 28 IU/L (ref 0–44)
AST: 15 IU/L (ref 0–40)
Albumin: 4.3 g/dL (ref 3.5–5.5)
BILIRUBIN TOTAL: 0.5 mg/dL (ref 0.0–1.2)
BUN / CREAT RATIO: 14 (ref 9–20)
BUN: 12 mg/dL (ref 6–24)
CHLORIDE: 97 mmol/L (ref 96–106)
CO2: 22 mmol/L (ref 18–29)
Calcium: 9.5 mg/dL (ref 8.7–10.2)
Creatinine, Ser: 0.86 mg/dL (ref 0.76–1.27)
GFR calc non Af Amer: 95 mL/min/{1.73_m2} (ref >59)
GFR, EST AFRICAN AMERICAN: 110 mL/min/{1.73_m2} (ref >59)
GLUCOSE: 235 mg/dL — AB (ref 65–99)
Globulin, Total: 2.4 g/dL (ref 1.5–4.5)
POTASSIUM: 4.1 mmol/L (ref 3.5–5.2)
Sodium: 137 mmol/L (ref 134–144)
TOTAL PROTEIN: 6.7 g/dL (ref 6.0–8.5)

## 2015-06-12 LAB — LIPID PANEL W/O CHOL/HDL RATIO
CHOLESTEROL TOTAL: 199 mg/dL (ref 100–199)
HDL: 43 mg/dL (ref >39)
LDL Calculated: 105 mg/dL — ABNORMAL HIGH (ref 0–99)
Triglycerides: 254 mg/dL — ABNORMAL HIGH (ref 0–149)
VLDL CHOLESTEROL CAL: 51 mg/dL — AB (ref 5–40)

## 2015-06-12 LAB — TSH: TSH: 3.03 u[IU]/mL (ref 0.450–4.500)

## 2015-06-12 LAB — PSA: Prostate Specific Ag, Serum: 0.7 ng/mL (ref 0.0–4.0)

## 2015-06-16 ENCOUNTER — Encounter: Payer: Self-pay | Admitting: Family Medicine

## 2015-09-04 ENCOUNTER — Telehealth: Payer: Self-pay | Admitting: Family Medicine

## 2015-09-04 NOTE — Telephone Encounter (Signed)
Pt would like to get his annual allergy shot at his appt next Tuesday and stated that it usually has to be ordered.

## 2015-09-08 ENCOUNTER — Other Ambulatory Visit: Payer: Self-pay

## 2015-09-08 ENCOUNTER — Encounter: Payer: Self-pay | Admitting: Family Medicine

## 2015-09-08 ENCOUNTER — Ambulatory Visit (INDEPENDENT_AMBULATORY_CARE_PROVIDER_SITE_OTHER): Payer: BLUE CROSS/BLUE SHIELD | Admitting: Family Medicine

## 2015-09-08 VITALS — BP 115/75 | HR 69 | Temp 98.3°F | Ht 69.1 in | Wt 206.0 lb

## 2015-09-08 DIAGNOSIS — I10 Essential (primary) hypertension: Secondary | ICD-10-CM | POA: Diagnosis not present

## 2015-09-08 DIAGNOSIS — E785 Hyperlipidemia, unspecified: Secondary | ICD-10-CM | POA: Diagnosis not present

## 2015-09-08 DIAGNOSIS — T7840XA Allergy, unspecified, initial encounter: Secondary | ICD-10-CM | POA: Insufficient documentation

## 2015-09-08 DIAGNOSIS — T7840XS Allergy, unspecified, sequela: Secondary | ICD-10-CM

## 2015-09-08 DIAGNOSIS — E119 Type 2 diabetes mellitus without complications: Secondary | ICD-10-CM | POA: Diagnosis not present

## 2015-09-08 LAB — MICROALBUMIN, URINE WAIVED
CREATININE, URINE WAIVED: 50 mg/dL (ref 10–300)
MICROALB, UR WAIVED: 10 mg/L (ref 0–19)

## 2015-09-08 LAB — MICROALBUMIN, URINE: MICROALB UR: 10

## 2015-09-08 LAB — BAYER DCA HB A1C WAIVED: HB A1C: 7.8 % — AB (ref <7.0)

## 2015-09-08 LAB — HEMOGLOBIN A1C: HEMOGLOBIN A1C: 7.8

## 2015-09-08 MED ORDER — TRIAMCINOLONE ACETONIDE 40 MG/ML IJ SUSP
40.0000 mg | Freq: Once | INTRAMUSCULAR | Status: AC
  Administered 2015-09-08: 40 mg via INTRAMUSCULAR

## 2015-09-08 NOTE — Progress Notes (Signed)
BP 115/75 (BP Location: Left Arm, Patient Position: Sitting, Cuff Size: Large)   Pulse 69   Temp 98.3 F (36.8 C)   Ht 5' 9.1" (1.755 m)   Wt 206 lb (93.4 kg)   SpO2 97%   BMI 30.33 kg/m    Subjective:    Patient ID: Philip Dunn., male    DOB: 11-May-1955, 60 y.o.   MRN: [id number]  HPI: Philip Clowe. is a 60 y.o. male  Chief Complaint  Patient presents with  . Diabetes  Diabetes doing well patient's lost 9 pounds and doing well blood sugar doing better feeling better. No low blood sugar spells no issues with medications  Blood pressure doing well no complaints from medications taken faithfully  Patient this time year with ragweed has marked allergy symptoms gets Kenalog injection and does well with no further allergy problems only gets this 1 prednisone dose a year. Mrs. consistently helped him year after year.  Relevant past medical, surgical, family and social history reviewed and updated as indicated. Interim medical history since our last visit reviewed. Allergies and medications reviewed and updated.  Review of Systems  Constitutional: Negative.   Respiratory: Negative.   Cardiovascular: Negative.     Per HPI unless specifically indicated above     Objective:    BP 115/75 (BP Location: Left Arm, Patient Position: Sitting, Cuff Size: Large)   Pulse 69   Temp 98.3 F (36.8 C)   Ht 5' 9.1" (1.755 m)   Wt 206 lb (93.4 kg)   SpO2 97%   BMI 30.33 kg/m   Wt Readings from Last 3 Encounters:  09/08/15 206 lb (93.4 kg)  06/11/15 215 lb (97.5 kg)  05/26/15 217 lb 6.4 oz (98.6 kg)    Physical Exam  Constitutional: He is oriented to person, place, and time. He appears well-developed and well-nourished. No distress.  HENT:  Head: Normocephalic and atraumatic.  Right Ear: Hearing normal.  Left Ear: Hearing normal.  Nose: Nose normal.  Eyes: Conjunctivae and lids are normal. Right eye exhibits no discharge. Left eye exhibits no discharge. No  scleral icterus.  Cardiovascular: Normal rate, regular rhythm and normal heart sounds.   Pulmonary/Chest: Effort normal and breath sounds normal. No respiratory distress.  Musculoskeletal: Normal range of motion.  Neurological: He is alert and oriented to person, place, and time.  Skin: Skin is intact. No rash noted.  Psychiatric: He has a normal mood and affect. His speech is normal and behavior is normal. Judgment and thought content normal. Cognition and memory are normal.    Results for orders placed or performed in visit on 06/11/15  HM DIABETES EYE EXAM  Result Value Ref Range   HM Diabetic Eye Exam Retinopathy (A) No Retinopathy      Assessment & Plan:   Problem List Items Addressed This Visit      Cardiovascular and Mediastinum   Hypertension    The current medical regimen is effective;  continue present plan and medications.         Endocrine   Diabetes mellitus ([place]) - Primary    The current medical regimen is effective;  continue present plan and medications.       Relevant Orders   Microalbumin, Urine Waived   Bayer DCA Hb A1c Waived     Other   Hyperlipidemia    The current medical regimen is effective;  continue present plan and medications.       Allergy  Will give Kenalog 40 mg 1 mL IM given      Relevant Medications   triamcinolone acetonide (KENALOG-40) injection 40 mg (Start on 09/08/2015 11:45 AM)    Other Visit Diagnoses   None.      Follow up plan: Return in about 3 months (around 12/09/2015) for BMP,  Lipids, ALT, AST, Hemoglobin A1c.

## 2015-09-08 NOTE — Assessment & Plan Note (Signed)
The current medical regimen is effective;  continue present plan and medications.  

## 2015-09-08 NOTE — Assessment & Plan Note (Signed)
Will give Kenalog 40 mg 1 mL IM given

## 2015-10-06 LAB — HM DIABETES EYE EXAM

## 2015-12-09 ENCOUNTER — Encounter: Payer: Self-pay | Admitting: Family Medicine

## 2015-12-09 ENCOUNTER — Ambulatory Visit (INDEPENDENT_AMBULATORY_CARE_PROVIDER_SITE_OTHER): Payer: BLUE CROSS/BLUE SHIELD | Admitting: Family Medicine

## 2015-12-09 VITALS — BP 122/72 | HR 98 | Temp 98.4°F | Wt 206.5 lb

## 2015-12-09 DIAGNOSIS — Z23 Encounter for immunization: Secondary | ICD-10-CM | POA: Diagnosis not present

## 2015-12-09 DIAGNOSIS — E785 Hyperlipidemia, unspecified: Secondary | ICD-10-CM

## 2015-12-09 DIAGNOSIS — I10 Essential (primary) hypertension: Secondary | ICD-10-CM | POA: Diagnosis not present

## 2015-12-09 DIAGNOSIS — E119 Type 2 diabetes mellitus without complications: Secondary | ICD-10-CM

## 2015-12-09 LAB — LP+ALT+AST PICCOLO, WAIVED
ALT (SGPT) Piccolo, Waived: 29 U/L (ref 10–47)
AST (SGOT) PICCOLO, WAIVED: 34 U/L (ref 11–38)
CHOL/HDL RATIO PICCOLO,WAIVE: 3.7 mg/dL
CHOLESTEROL PICCOLO, WAIVED: 198 mg/dL (ref <200)
HDL CHOL PICCOLO, WAIVED: 54 mg/dL — AB (ref >59)
LDL CHOL CALC PICCOLO WAIVED: 121 mg/dL — AB (ref <100)
Triglycerides Piccolo,Waived: 119 mg/dL (ref <150)
VLDL Chol Calc Piccolo,Waive: 24 mg/dL (ref <30)

## 2015-12-09 LAB — BAYER DCA HB A1C WAIVED: HB A1C (BAYER DCA - WAIVED): 7.2 % — ABNORMAL HIGH (ref <7.0)

## 2015-12-09 NOTE — Assessment & Plan Note (Signed)
The current medical regimen is effective;  continue present plan and medications.  

## 2015-12-09 NOTE — Assessment & Plan Note (Addendum)
The current medical regimen is effective;  continue present plan and medications. Patient's A1c is gradually coming down we will continue current medications without change and encourage better lifestyle diet nutrition.

## 2015-12-09 NOTE — Patient Instructions (Addendum)

## 2015-12-09 NOTE — Progress Notes (Signed)
BP 122/72 (BP Location: Left Arm)   Pulse 98   Temp 98.4 F (36.9 C)   Wt 206 lb 8 oz (93.7 kg)   SpO2 96%   BMI 30.41 kg/m    Subjective:    Patient ID: Philip Dunn., male    DOB: 22-May-1955, 60 y.o.   MRN: [id number]  HPI: Philip Longan. is a 60 y.o. male  Chief Complaint  Patient presents with  . Diabetes  . Hyperlipidemia  . Hypertension   Follow-up patient doing well no complaints with diabetes noted low blood sugar spells taking medications without problems. Blood pressure good control taking medications having a lot of stress but otherwise blood pressures been doing well. Cholesterol trying to manage with diet not willing to take medications. Relevant past medical, surgical, family and social history reviewed and updated as indicated. Interim medical history since our last visit reviewed. Allergies and medications reviewed and updated.  Review of Systems  Constitutional: Negative.   Respiratory: Negative.   Cardiovascular: Negative.     Per HPI unless specifically indicated above     Objective:    BP 122/72 (BP Location: Left Arm)   Pulse 98   Temp 98.4 F (36.9 C)   Wt 206 lb 8 oz (93.7 kg)   SpO2 96%   BMI 30.41 kg/m   Wt Readings from Last 3 Encounters:  12/09/15 206 lb 8 oz (93.7 kg)  09/08/15 206 lb (93.4 kg)  06/11/15 215 lb (97.5 kg)    Physical Exam  Constitutional: He is oriented to person, place, and time. He appears well-developed and well-nourished. No distress.  HENT:  Head: Normocephalic and atraumatic.  Right Ear: Hearing normal.  Left Ear: Hearing normal.  Nose: Nose normal.  Eyes: Conjunctivae and lids are normal. Right eye exhibits no discharge. Left eye exhibits no discharge. No scleral icterus.  Cardiovascular: Normal rate, regular rhythm and normal heart sounds.   Pulmonary/Chest: Effort normal and breath sounds normal. No respiratory distress.  Musculoskeletal: Normal range of motion.  Neurological: He is  alert and oriented to person, place, and time.  Skin: Skin is intact. No rash noted.  Psychiatric: He has a normal mood and affect. His speech is normal and behavior is normal. Judgment and thought content normal. Cognition and memory are normal.    Results for orders placed or performed in visit on 10/14/15  HM DIABETES EYE EXAM  Result Value Ref Range   HM Diabetic Eye Exam No Retinopathy No Retinopathy      Assessment & Plan:   Problem List Items Addressed This Visit      Cardiovascular and Mediastinum   Hypertension    The current medical regimen is effective;  continue present plan and medications.       Relevant Orders   Basic metabolic panel     Endocrine   Diabetes mellitus ([place]) - Primary    The current medical regimen is effective;  continue present plan and medications. Patient's A1c is gradually coming down we will continue current medications without change and encourage better lifestyle diet nutrition.      Relevant Orders   Bayer DCA Hb A1c Waived     Other   Hyperlipidemia   Relevant Orders   LP+ALT+AST Piccolo, Waived   Basic metabolic panel    Other Visit Diagnoses    Immunization due       Relevant Orders   Flu Vaccine QUAD 36+ mos PF IM (Fluarix & [employer]one [employer]  PF) (Completed)       Follow up plan: Return in about 3 months (around 03/10/2016) for Hemoglobin A1c.

## 2015-12-10 LAB — BASIC METABOLIC PANEL
BUN/Creatinine Ratio: 15 (ref 9–20)
BUN: 11 mg/dL (ref 6–24)
CO2: 24 mmol/L (ref 18–29)
CREATININE: 0.74 mg/dL — AB (ref 0.76–1.27)
Calcium: 9.4 mg/dL (ref 8.7–10.2)
Chloride: 101 mmol/L (ref 96–106)
GFR, EST AFRICAN AMERICAN: 117 mL/min/{1.73_m2} (ref >59)
GFR, EST NON AFRICAN AMERICAN: 101 mL/min/{1.73_m2} (ref >59)
Glucose: 137 mg/dL — ABNORMAL HIGH (ref 65–99)
Potassium: 4.5 mmol/L (ref 3.5–5.2)
SODIUM: 140 mmol/L (ref 134–144)

## 2015-12-14 ENCOUNTER — Encounter: Payer: Self-pay | Admitting: Family Medicine

## 2015-12-24 ENCOUNTER — Other Ambulatory Visit: Payer: Self-pay | Admitting: Family Medicine

## 2016-02-01 NOTE — Telephone Encounter (Signed)
Disregard

## 2016-03-14 ENCOUNTER — Ambulatory Visit (INDEPENDENT_AMBULATORY_CARE_PROVIDER_SITE_OTHER): Payer: BLUE CROSS/BLUE SHIELD | Admitting: Family Medicine

## 2016-03-14 ENCOUNTER — Encounter: Payer: Self-pay | Admitting: Family Medicine

## 2016-03-14 VITALS — BP 130/87 | HR 71 | Ht 69.0 in | Wt 206.6 lb

## 2016-03-14 DIAGNOSIS — E785 Hyperlipidemia, unspecified: Secondary | ICD-10-CM

## 2016-03-14 DIAGNOSIS — E119 Type 2 diabetes mellitus without complications: Secondary | ICD-10-CM | POA: Diagnosis not present

## 2016-03-14 DIAGNOSIS — I10 Essential (primary) hypertension: Secondary | ICD-10-CM | POA: Diagnosis not present

## 2016-03-14 LAB — BAYER DCA HB A1C WAIVED: HB A1C: 6.9 % (ref <7.0)

## 2016-03-14 NOTE — Assessment & Plan Note (Signed)
The current medical regimen is effective;  continue present plan and medications.  

## 2016-03-14 NOTE — Progress Notes (Signed)
BP 130/87   Pulse 71   Ht 5\' 9"  (1.753 m)   Wt 206 lb 9.6 oz (93.7 kg)   SpO2 99%   BMI 30.51 kg/m    Subjective:    Patient ID: Philip Ryan., male    DOB: April 08, 1955, 61 y.o.   MRN: [id number]  HPI: Philip Ryan. is a 61 y.o. male  Chief Complaint  Patient presents with  . Follow-up  . Diabetes   Patient doing well no complaints with medications noted low blood sugar spells. Blood pressure doing well with medications no complaints. Has been working on weight but lately of more of a struggle and actually lost a couple pounds but has regained him.  Relevant past medical, surgical, family and social history reviewed and updated as indicated. Interim medical history since our last visit reviewed. Allergies and medications reviewed and updated.  Review of Systems  Constitutional: Negative.   Respiratory: Negative.   Cardiovascular: Negative.     Per HPI unless specifically indicated above     Objective:    BP 130/87   Pulse 71   Ht 5\' 9"  (1.753 m)   Wt 206 lb 9.6 oz (93.7 kg)   SpO2 99%   BMI 30.51 kg/m   Wt Readings from Last 3 Encounters:  03/14/16 206 lb 9.6 oz (93.7 kg)  12/09/15 206 lb 8 oz (93.7 kg)  09/08/15 206 lb (93.4 kg)    Physical Exam  Constitutional: He is oriented to person, place, and time. He appears well-developed and well-nourished.  HENT:  Head: Normocephalic and atraumatic.  Eyes: Conjunctivae and EOM are normal.  Neck: Normal range of motion.  Cardiovascular: Normal rate, regular rhythm and normal heart sounds.   Pulmonary/Chest: Effort normal and breath sounds normal.  Musculoskeletal: Normal range of motion.  Neurological: He is alert and oriented to person, place, and time.  Skin: No erythema.  Psychiatric: He has a normal mood and affect. His behavior is normal. Judgment and thought content normal.    Results for orders placed or performed in visit on 12/09/15  Bayer DCA Hb A1c Waived  Result Value Ref Range     Bayer DCA Hb A1c Waived 7.2 (H) <7.0 %  LP+ALT+AST Piccolo, Waived  Result Value Ref Range   ALT (SGPT) Piccolo, Waived 29 10 - 47 U/L   AST (SGOT) Piccolo, Waived 34 11 - 38 U/L   Cholesterol Piccolo, Waived 198 <200 mg/dL   HDL Chol Piccolo, Waived 54 (L) >59 mg/dL   Triglycerides Piccolo,Waived 119 <150 mg/dL   Chol/HDL Ratio Piccolo,Waive 3.7 mg/dL   LDL Chol Calc Piccolo Waived 121 (H) <100 mg/dL   VLDL Chol Calc Piccolo,Waive 24 <30 mg/dL  Basic metabolic panel  Result Value Ref Range   Glucose 137 (H) 65 - 99 mg/dL   BUN 11 6 - 24 mg/dL   Creatinine, Ser 0.74 (L) 0.76 - 1.27 mg/dL   GFR calc non Af Amer 101 >59 mL/min/1.73   GFR calc Af Amer 117 >59 mL/min/1.73   BUN/Creatinine Ratio 15 9 - 20   Sodium 140 134 - 144 mmol/L   Potassium 4.5 3.5 - 5.2 mmol/L   Chloride 101 96 - 106 mmol/L   CO2 24 18 - 29 mmol/L   Calcium 9.4 8.7 - 10.2 mg/dL      Assessment & Plan:   Problem List Items Addressed This Visit      Cardiovascular and Mediastinum   Hypertension - Primary  The current medical regimen is effective;  continue present plan and medications.       Relevant Orders   Bayer DCA Hb A1c Waived     Endocrine   Diabetes mellitus ([place])    The current medical regimen is effective;  continue present plan and medications.       Relevant Orders   Bayer DCA Hb A1c Waived     Other   Hyperlipidemia   Relevant Orders   Bayer DCA Hb A1c Waived       Follow up plan: Return in about 3 months (around 06/11/2016) for Physical Exam, Hemoglobin A1c.

## 2016-06-14 ENCOUNTER — Ambulatory Visit (INDEPENDENT_AMBULATORY_CARE_PROVIDER_SITE_OTHER): Payer: BLUE CROSS/BLUE SHIELD | Admitting: Family Medicine

## 2016-06-14 ENCOUNTER — Encounter: Payer: Self-pay | Admitting: Family Medicine

## 2016-06-14 VITALS — BP 128/78 | HR 83 | Ht 70.47 in | Wt 205.0 lb

## 2016-06-14 DIAGNOSIS — I10 Essential (primary) hypertension: Secondary | ICD-10-CM

## 2016-06-14 DIAGNOSIS — Z125 Encounter for screening for malignant neoplasm of prostate: Secondary | ICD-10-CM | POA: Diagnosis not present

## 2016-06-14 DIAGNOSIS — E119 Type 2 diabetes mellitus without complications: Secondary | ICD-10-CM | POA: Diagnosis not present

## 2016-06-14 DIAGNOSIS — G4730 Sleep apnea, unspecified: Secondary | ICD-10-CM | POA: Diagnosis not present

## 2016-06-14 DIAGNOSIS — Z0000 Encounter for general adult medical examination without abnormal findings: Secondary | ICD-10-CM

## 2016-06-14 DIAGNOSIS — E785 Hyperlipidemia, unspecified: Secondary | ICD-10-CM | POA: Diagnosis not present

## 2016-06-14 DIAGNOSIS — Z79899 Other long term (current) drug therapy: Secondary | ICD-10-CM

## 2016-06-14 DIAGNOSIS — Z1329 Encounter for screening for other suspected endocrine disorder: Secondary | ICD-10-CM | POA: Diagnosis not present

## 2016-06-14 LAB — URINALYSIS, ROUTINE W REFLEX MICROSCOPIC
Bilirubin, UA: NEGATIVE
Leukocytes, UA: NEGATIVE
NITRITE UA: NEGATIVE
Protein, UA: NEGATIVE
RBC UA: NEGATIVE
Specific Gravity, UA: 1.015 (ref 1.005–1.030)
UUROB: 0.2 mg/dL (ref 0.2–1.0)
pH, UA: 5.5 (ref 5.0–7.5)

## 2016-06-14 LAB — BAYER DCA HB A1C WAIVED: HB A1C: 6.8 % (ref <7.0)

## 2016-06-14 LAB — MICROSCOPIC EXAMINATION

## 2016-06-14 MED ORDER — AMLODIPINE BESYLATE 10 MG PO TABS: 10.0000 mg | ORAL_TABLET | Freq: Every day | ORAL | 4 refills | Status: DC

## 2016-06-14 MED ORDER — DAPAGLIFLOZIN PRO-METFORMIN ER 5-1000 MG PO TB24: 1.0000 | ORAL_TABLET | Freq: Two times a day (BID) | ORAL | 4 refills | Status: DC

## 2016-06-14 MED ORDER — TELMISARTAN 80 MG PO TABS: 80.0000 mg | ORAL_TABLET | Freq: Every day | ORAL | 4 refills | Status: DC

## 2016-06-14 NOTE — Progress Notes (Signed)
BP 128/78 (BP Location: Left Arm)   Pulse 83   Ht 5' 10.47" (1.79 m)   Wt 205 lb (93 kg)   SpO2 99%   BMI 29.02 kg/m    Subjective:    Patient ID: Philip Dunn., male    DOB: 1955-10-02, 61 y.o.   MRN: [id number]  HPI: Philip Engram. is a 61 y.o. male  Chief Complaint  Patient presents with  . Annual Exam  Patient follow-up doing well with blood pressure no complaints from medications. Takes faithfully without problems. Some occasional dysphagia and nothing new has had previous endoscopy and not having any worsening symptoms. Diabetes doing well noted low blood sugar spells. Knee degenerative degenerative arthritis stable takes occasional Advil at night  Relevant past medical, surgical, family and social history reviewed and updated as indicated. Interim medical history since our last visit reviewed. Allergies and medications reviewed and updated.  Review of Systems  Constitutional: Negative.   HENT: Negative.   Eyes: Negative.   Respiratory: Negative.   Cardiovascular: Negative.   Gastrointestinal: Negative.   Endocrine: Negative.   Genitourinary: Negative.   Musculoskeletal: Negative.   Skin: Negative.   Allergic/Immunologic: Negative.   Neurological: Negative.   Hematological: Negative.   Psychiatric/Behavioral: Negative.     Per HPI unless specifically indicated above     Objective:    BP 128/78 (BP Location: Left Arm)   Pulse 83   Ht 5' 10.47" (1.79 m)   Wt 205 lb (93 kg)   SpO2 99%   BMI 29.02 kg/m   Wt Readings from Last 3 Encounters:  06/14/16 205 lb (93 kg)  03/14/16 206 lb 9.6 oz (93.7 kg)  12/09/15 206 lb 8 oz (93.7 kg)    Physical Exam  Constitutional: He is oriented to person, place, and time. He appears well-developed and well-nourished.  HENT:  Head: Normocephalic and atraumatic.  Right Ear: External ear normal.  Left Ear: External ear normal.  Eyes: Conjunctivae and EOM are normal. Pupils are equal, round, and reactive  to light.  Neck: Normal range of motion. Neck supple.  Cardiovascular: Normal rate, regular rhythm, normal heart sounds and intact distal pulses.   Pulmonary/Chest: Effort normal and breath sounds normal.  Abdominal: Soft. Bowel sounds are normal. There is no splenomegaly or hepatomegaly.  Genitourinary: Rectum normal, prostate normal and penis normal.  Musculoskeletal: Normal range of motion.  Neurological: He is alert and oriented to person, place, and time. He has normal reflexes.  Skin: No rash noted. No erythema.  Psychiatric: He has a normal mood and affect. His behavior is normal. Judgment and thought content normal.    Results for orders placed or performed in visit on 03/14/16  Bayer DCA Hb A1c Waived  Result Value Ref Range   Bayer DCA Hb A1c Waived 6.9 <7.0 %      Assessment & Plan:   Problem List Items Addressed This Visit      Cardiovascular and Mediastinum   Hypertension    The current medical regimen is effective;  continue present plan and medications.       Relevant Medications   amLODipine (NORVASC) 10 MG tablet   telmisartan (MICARDIS) 80 MG tablet   Other Relevant Orders   Urinalysis, Routine w reflex microscopic   Bayer DCA Hb A1c Waived     Respiratory   Sleep apnea     Endocrine   Diabetes mellitus ([place])    The current medical regimen is effective;  continue  present plan and medications.       Relevant Medications   telmisartan (MICARDIS) 80 MG tablet   Dapagliflozin-Metformin HCl ER (XIGDUO XR) 05-998 MG TB24   Other Relevant Orders   Comprehensive metabolic panel   Urinalysis, Routine w reflex microscopic   Bayer DCA Hb A1c Waived     Other   Hyperlipidemia   Relevant Medications   amLODipine (NORVASC) 10 MG tablet   telmisartan (MICARDIS) 80 MG tablet   Other Relevant Orders   Lipid panel   Urinalysis, Routine w reflex microscopic   Bayer DCA Hb A1c Waived    Other Visit Diagnoses    Routine general medical examination at a  health care facility    -  Primary   Relevant Orders   CBC with Differential/Platelet   Comprehensive metabolic panel   Lipid panel   PSA   TSH   Urinalysis, Routine w reflex microscopic   Bayer DCA Hb A1c Waived   Thyroid disorder screen       Relevant Orders   TSH   Prostate cancer screening       Relevant Orders   PSA   Medication management       Relevant Orders   Comprehensive metabolic panel       Follow up plan: Return in about 3 months (around 09/14/2016) for Hemoglobin A1c.

## 2016-06-14 NOTE — Assessment & Plan Note (Signed)
The current medical regimen is effective;  continue present plan and medications.  

## 2016-06-15 ENCOUNTER — Encounter: Payer: Self-pay | Admitting: Family Medicine

## 2016-06-15 LAB — COMPREHENSIVE METABOLIC PANEL
A/G RATIO: 1.9 (ref 1.2–2.2)
ALT: 20 IU/L (ref 0–44)
AST: 13 IU/L (ref 0–40)
Albumin: 4.5 g/dL (ref 3.6–4.8)
Alkaline Phosphatase: 67 IU/L (ref 39–117)
BILIRUBIN TOTAL: 0.4 mg/dL (ref 0.0–1.2)
BUN/Creatinine Ratio: 15 (ref 10–24)
BUN: 11 mg/dL (ref 8–27)
CHLORIDE: 104 mmol/L (ref 96–106)
CO2: 24 mmol/L (ref 18–29)
Calcium: 9.1 mg/dL (ref 8.6–10.2)
Creatinine, Ser: 0.72 mg/dL — ABNORMAL LOW (ref 0.76–1.27)
GFR, EST AFRICAN AMERICAN: 117 mL/min/{1.73_m2} (ref >59)
GFR, EST NON AFRICAN AMERICAN: 101 mL/min/{1.73_m2} (ref >59)
GLOBULIN, TOTAL: 2.4 g/dL (ref 1.5–4.5)
Glucose: 154 mg/dL — ABNORMAL HIGH (ref 65–99)
POTASSIUM: 4.4 mmol/L (ref 3.5–5.2)
SODIUM: 142 mmol/L (ref 134–144)
TOTAL PROTEIN: 6.9 g/dL (ref 6.0–8.5)

## 2016-06-15 LAB — CBC WITH DIFFERENTIAL/PLATELET
BASOS: 1 %
Basophils Absolute: 0.1 10*3/uL (ref 0.0–0.2)
EOS (ABSOLUTE): 0.4 10*3/uL (ref 0.0–0.4)
EOS: 7 %
HEMATOCRIT: 45.1 % (ref 37.5–51.0)
Hemoglobin: 15.8 g/dL (ref 13.0–17.7)
IMMATURE GRANS (ABS): 0 10*3/uL (ref 0.0–0.1)
Immature Granulocytes: 0 %
Lymphocytes Absolute: 1.4 10*3/uL (ref 0.7–3.1)
Lymphs: 28 %
MCH: 29.9 pg (ref 26.6–33.0)
MCHC: 35 g/dL (ref 31.5–35.7)
MCV: 85 fL (ref 79–97)
MONOS ABS: 0.3 10*3/uL (ref 0.1–0.9)
Monocytes: 6 %
NEUTROS ABS: 2.9 10*3/uL (ref 1.4–7.0)
Neutrophils: 58 %
PLATELETS: 182 10*3/uL (ref 150–379)
RBC: 5.29 x10E6/uL (ref 4.14–5.80)
RDW: 14.1 % (ref 12.3–15.4)
WBC: 5 10*3/uL (ref 3.4–10.8)

## 2016-06-15 LAB — LIPID PANEL
CHOL/HDL RATIO: 4.3 ratio (ref 0.0–5.0)
Cholesterol, Total: 210 mg/dL — ABNORMAL HIGH (ref 100–199)
HDL: 49 mg/dL (ref >39)
LDL Calculated: 136 mg/dL — ABNORMAL HIGH (ref 0–99)
Triglycerides: 124 mg/dL (ref 0–149)
VLDL Cholesterol Cal: 25 mg/dL (ref 5–40)

## 2016-06-15 LAB — TSH: TSH: 3.86 u[IU]/mL (ref 0.450–4.500)

## 2016-06-15 LAB — PSA: PROSTATE SPECIFIC AG, SERUM: 0.6 ng/mL (ref 0.0–4.0)

## 2016-06-22 ENCOUNTER — Other Ambulatory Visit: Payer: Self-pay | Admitting: Chiropractic Medicine

## 2016-06-22 DIAGNOSIS — M25512 Pain in left shoulder: Secondary | ICD-10-CM

## 2016-06-23 ENCOUNTER — Ambulatory Visit
Admission: RE | Admit: 2016-06-23 | Discharge: 2016-06-23 | Disposition: A | Discharge status: Home / Self Care | Payer: Self-pay | Source: Ambulatory Visit | Attending: Chiropractic Medicine | Admitting: Chiropractic Medicine

## 2016-06-23 DIAGNOSIS — M25512 Pain in left shoulder: Secondary | ICD-10-CM | POA: Insufficient documentation

## 2016-07-15 ENCOUNTER — Other Ambulatory Visit: Payer: Self-pay | Admitting: Orthopedic Surgery

## 2016-07-15 DIAGNOSIS — S46012A Strain of muscle(s) and tendon(s) of the rotator cuff of left shoulder, initial encounter: Secondary | ICD-10-CM

## 2016-07-18 ENCOUNTER — Other Ambulatory Visit: Payer: Self-pay | Admitting: Orthopedic Surgery

## 2016-07-18 DIAGNOSIS — M25512 Pain in left shoulder: Secondary | ICD-10-CM

## 2016-07-22 ENCOUNTER — Ambulatory Visit: Payer: BLUE CROSS/BLUE SHIELD

## 2016-07-27 ENCOUNTER — Ambulatory Visit
Admission: RE | Admit: 2016-07-27 | Discharge: 2016-07-27 | Disposition: A | Discharge status: Home / Self Care | Payer: BLUE CROSS/BLUE SHIELD | Source: Ambulatory Visit | Attending: Orthopedic Surgery | Admitting: Orthopedic Surgery

## 2016-07-27 DIAGNOSIS — S46012A Strain of muscle(s) and tendon(s) of the rotator cuff of left shoulder, initial encounter: Secondary | ICD-10-CM | POA: Insufficient documentation

## 2016-07-27 DIAGNOSIS — X58XXXA Exposure to other specified factors, initial encounter: Secondary | ICD-10-CM | POA: Diagnosis not present

## 2016-08-11 ENCOUNTER — Ambulatory Visit (INDEPENDENT_AMBULATORY_CARE_PROVIDER_SITE_OTHER): Payer: BLUE CROSS/BLUE SHIELD | Admitting: Family Medicine

## 2016-08-11 ENCOUNTER — Encounter: Payer: Self-pay | Admitting: Family Medicine

## 2016-08-11 VITALS — BP 135/89 | HR 103 | Wt 207.0 lb

## 2016-08-11 DIAGNOSIS — Z01818 Encounter for other preprocedural examination: Secondary | ICD-10-CM | POA: Diagnosis not present

## 2016-08-11 NOTE — Progress Notes (Signed)
   BP 135/89   Pulse (!) 103   Wt 207 lb (93.9 kg)   SpO2 98%   BMI 29.30 kg/m    Subjective:    Patient ID: Philip Ryan., male    DOB: September 16, 1955, 61 y.o.   MRN: [id number]  HPI: Philip Ryan. is a 61 y.o. male  Preop patient only needs EKG. No cardiac symptoms no other exam done  EKG first EKG showing atrial fibrillation by computer reading not on EKG.   repeat EKG is normal.  Review of Systems  Per HPI unless specifically indicated above     Objective:    BP 135/89   Pulse (!) 103   Wt 207 lb (93.9 kg)   SpO2 98%   BMI 29.30 kg/m   Wt Readings from Last 3 Encounters:  08/11/16 207 lb (93.9 kg)  06/14/16 205 lb (93 kg)  03/14/16 206 lb 9.6 oz (93.7 kg)    Physical Exam     Assessment & Plan:   Problem List Items Addressed This Visit    None    Visit Diagnoses    Pre-op testing    -  Primary   Relevant Orders   EKG 12-Lead (Completed)       Follow up plan: Return for Cleared for surgery.

## 2016-08-16 ENCOUNTER — Other Ambulatory Visit: Payer: Self-pay | Admitting: Orthopedic Surgery

## 2016-08-17 ENCOUNTER — Other Ambulatory Visit
Admission: RE | Admit: 2016-08-17 | Discharge: 2016-08-17 | Disposition: A | Discharge status: Home / Self Care | Payer: BLUE CROSS/BLUE SHIELD | Source: Ambulatory Visit | Attending: Orthopedic Surgery | Admitting: Orthopedic Surgery

## 2016-08-17 DIAGNOSIS — M75121 Complete rotator cuff tear or rupture of right shoulder, not specified as traumatic: Secondary | ICD-10-CM | POA: Diagnosis not present

## 2016-08-17 DIAGNOSIS — Z01812 Encounter for preprocedural laboratory examination: Secondary | ICD-10-CM | POA: Insufficient documentation

## 2016-08-17 LAB — PROTIME-INR
INR: 1.03
Prothrombin Time: 13.5 seconds (ref 11.4–15.2)

## 2016-08-17 LAB — BASIC METABOLIC PANEL
Anion gap: 9 (ref 5–15)
BUN: 13 mg/dL (ref 6–20)
CHLORIDE: 104 mmol/L (ref 101–111)
CO2: 28 mmol/L (ref 22–32)
CREATININE: 0.87 mg/dL (ref 0.61–1.24)
Calcium: 9.6 mg/dL (ref 8.9–10.3)
GFR calc Af Amer: >60 mL/min (ref >60)
GFR calc non Af Amer: >60 mL/min (ref >60)
GLUCOSE: 128 mg/dL — AB (ref 65–99)
Potassium: 4.3 mmol/L (ref 3.5–5.1)
SODIUM: 141 mmol/L (ref 135–145)

## 2016-08-17 LAB — CBC WITH DIFFERENTIAL/PLATELET
BASOS ABS: 0.1 10*3/uL (ref 0–0.1)
Basophils Relative: 1 %
EOS PCT: 5 %
Eosinophils Absolute: 0.3 10*3/uL (ref 0–0.7)
HCT: 46.2 % (ref 40.0–52.0)
Hemoglobin: 15.7 g/dL (ref 13.0–18.0)
LYMPHS PCT: 27 %
Lymphs Abs: 1.6 10*3/uL (ref 1.0–3.6)
MCH: 29.6 pg (ref 26.0–34.0)
MCHC: 33.9 g/dL (ref 32.0–36.0)
MCV: 87.2 fL (ref 80.0–100.0)
MONO ABS: 0.3 10*3/uL (ref 0.2–1.0)
Monocytes Relative: 5 %
Neutro Abs: 3.6 10*3/uL (ref 1.4–6.5)
Neutrophils Relative %: 62 %
PLATELETS: 203 10*3/uL (ref 150–440)
RBC: 5.3 MIL/uL (ref 4.40–5.90)
RDW: 12.9 % (ref 11.5–14.5)
WBC: 5.8 10*3/uL (ref 3.8–10.6)

## 2016-08-17 LAB — APTT: aPTT: 28 seconds (ref 24–36)

## 2016-08-18 NOTE — Discharge Instructions (Signed)
General Anesthesia, Adult, Care After °These instructions provide you with information about caring for yourself after your procedure. Your health care provider may also give you more specific instructions. Your treatment has been planned according to current medical practices, but problems sometimes occur. Call your health care provider if you have any problems or questions after your procedure. °What can I expect after the procedure? °After the procedure, it is common to have: °· Vomiting. °· A sore throat. °· Mental slowness. ° °It is common to feel: °· Nauseous. °· Cold or shivery. °· Sleepy. °· Tired. °· Sore or achy, even in parts of your body where you did not have surgery. ° °Follow these instructions at home: °For at least 24 hours after the procedure: °· Do not: °? Participate in activities where you could fall or become injured. °? Drive. °? Use heavy machinery. °? Drink alcohol. °? Take sleeping pills or medicines that cause drowsiness. °? Make important decisions or sign legal documents. °? Take care of children on your own. °· Rest. °Eating and drinking °· If you vomit, drink water, juice, or soup when you can drink without vomiting. °· Drink enough fluid to keep your urine clear or pale yellow. °· Make sure you have little or no nausea before eating solid foods. °· Follow the diet recommended by your health care provider. °General instructions °· Have a responsible adult stay with you until you are awake and alert. °· Return to your normal activities as told by your health care provider. Ask your health care provider what activities are safe for you. °· Take over-the-counter and prescription medicines only as told by your health care provider. °· If you smoke, do not smoke without supervision. °· Keep all follow-up visits as told by your health care provider. This is important. °Contact a health care provider if: °· You continue to have nausea or vomiting at home, and medicines are not helpful. °· You  cannot drink fluids or start eating again. °· You cannot urinate after 8-12 hours. °· You develop a skin rash. °· You have fever. °· You have increasing redness at the site of your procedure. °Get help right away if: °· You have difficulty breathing. °· You have chest pain. °· You have unexpected bleeding. °· You feel that you are having a life-threatening or urgent problem. °This information is not intended to replace advice given to you by your health care provider. Make sure you discuss any questions you have with your health care provider. °Document Released: 04/11/2000 Document Revised: 06/08/2015 Document Reviewed: 12/18/2014 °Elsevier Interactive Patient Education © 2018 [employer] ° °

## 2016-08-29 ENCOUNTER — Ambulatory Visit
Admission: RE | Admit: 2016-08-29 | Discharge: 2016-08-29 | Disposition: A | Discharge status: Home / Self Care | Payer: BLUE CROSS/BLUE SHIELD | Source: Ambulatory Visit | Attending: Orthopedic Surgery | Admitting: Orthopedic Surgery

## 2016-08-29 ENCOUNTER — Encounter
Admission: RE | Disposition: A | Discharge status: Home / Self Care | Payer: Self-pay | Source: Ambulatory Visit | Attending: Orthopedic Surgery

## 2016-08-29 ENCOUNTER — Ambulatory Visit: Payer: BLUE CROSS/BLUE SHIELD | Admitting: Anesthesiology

## 2016-08-29 DIAGNOSIS — S43432A Superior glenoid labrum lesion of left shoulder, initial encounter: Secondary | ICD-10-CM | POA: Diagnosis not present

## 2016-08-29 DIAGNOSIS — X58XXXA Exposure to other specified factors, initial encounter: Secondary | ICD-10-CM | POA: Diagnosis not present

## 2016-08-29 DIAGNOSIS — Z79899 Other long term (current) drug therapy: Secondary | ICD-10-CM | POA: Insufficient documentation

## 2016-08-29 DIAGNOSIS — S46112A Strain of muscle, fascia and tendon of long head of biceps, left arm, initial encounter: Secondary | ICD-10-CM | POA: Diagnosis not present

## 2016-08-29 DIAGNOSIS — Z87891 Personal history of nicotine dependence: Secondary | ICD-10-CM | POA: Insufficient documentation

## 2016-08-29 DIAGNOSIS — I10 Essential (primary) hypertension: Secondary | ICD-10-CM | POA: Insufficient documentation

## 2016-08-29 DIAGNOSIS — Z7984 Long term (current) use of oral hypoglycemic drugs: Secondary | ICD-10-CM | POA: Insufficient documentation

## 2016-08-29 DIAGNOSIS — E119 Type 2 diabetes mellitus without complications: Secondary | ICD-10-CM | POA: Diagnosis not present

## 2016-08-29 DIAGNOSIS — M75122 Complete rotator cuff tear or rupture of left shoulder, not specified as traumatic: Secondary | ICD-10-CM | POA: Insufficient documentation

## 2016-08-29 HISTORY — PX: SHOULDER ARTHROSCOPY WITH BICEPS TENDON REPAIR: SHX5674

## 2016-08-29 HISTORY — PX: SHOULDER ARTHROSCOPY WITH OPEN ROTATOR CUFF REPAIR AND DISTAL CLAVICLE ACROMINECTOMY: SHX5683

## 2016-08-29 LAB — GLUCOSE, CAPILLARY
Glucose-Capillary: 147 mg/dL — ABNORMAL HIGH (ref 65–99)
Glucose-Capillary: 179 mg/dL — ABNORMAL HIGH (ref 65–99)

## 2016-08-29 SURGERY — SHOULDER ARTHROSCOPY WITH OPEN ROTATOR CUFF REPAIR AND DISTAL CLAVICLE ACROMINECTOMY
Anesthesia: Regional | Site: Shoulder | Laterality: Left | Wound class: Clean

## 2016-08-29 MED ORDER — HYDROCODONE-ACETAMINOPHEN 5-325 MG PO TABS: 1.0000 | ORAL_TABLET | ORAL | 0 refills | Status: DC | PRN

## 2016-08-29 MED ORDER — MIDAZOLAM HCL 5 MG/5ML IJ SOLN
INTRAMUSCULAR | Status: DC | PRN
  Administered 2016-08-29: 1 mg via INTRAVENOUS

## 2016-08-29 MED ORDER — BUPIVACAINE HCL (PF) 0.5 % IJ SOLN
INTRAMUSCULAR | Status: DC | PRN
  Administered 2016-08-29: 40 mL

## 2016-08-29 MED ORDER — OXYCODONE HCL 5 MG/5ML PO SOLN: 5.0000 mg | Freq: Once | ORAL | Status: DC | PRN

## 2016-08-29 MED ORDER — LACTATED RINGERS IR SOLN
Status: DC | PRN
  Administered 2016-08-29: 30000 mL

## 2016-08-29 MED ORDER — FENTANYL CITRATE (PF) 100 MCG/2ML IJ SOLN
INTRAMUSCULAR | Status: DC | PRN
  Administered 2016-08-29: 50 ug via INTRAVENOUS

## 2016-08-29 MED ORDER — CHLORHEXIDINE GLUCONATE CLOTH 2 % EX PADS: 6.0000 | MEDICATED_PAD | Freq: Once | CUTANEOUS | Status: DC

## 2016-08-29 MED ORDER — DEXAMETHASONE SODIUM PHOSPHATE 4 MG/ML IJ SOLN
INTRAMUSCULAR | Status: DC | PRN
  Administered 2016-08-29: 4 mg via INTRAVENOUS

## 2016-08-29 MED ORDER — BUPIVACAINE HCL (PF) 0.5 % IJ SOLN
INTRAMUSCULAR | Status: DC | PRN
  Administered 2016-08-29: 30 mL

## 2016-08-29 MED ORDER — GLYCOPYRROLATE 0.2 MG/ML IJ SOLN
INTRAMUSCULAR | Status: DC | PRN
  Administered 2016-08-29: 0.1 mg via INTRAVENOUS

## 2016-08-29 MED ORDER — EPHEDRINE SULFATE 50 MG/ML IJ SOLN
INTRAMUSCULAR | Status: DC | PRN
  Administered 2016-08-29 (×8): 5 mg via INTRAVENOUS

## 2016-08-29 MED ORDER — LIDOCAINE HCL (PF) 1 % IJ SOLN
INTRAMUSCULAR | Status: DC | PRN
  Administered 2016-08-29: 10 mL

## 2016-08-29 MED ORDER — ONDANSETRON HCL 4 MG/2ML IJ SOLN
INTRAMUSCULAR | Status: DC | PRN
  Administered 2016-08-29: 4 mg via INTRAVENOUS

## 2016-08-29 MED ORDER — CEFAZOLIN SODIUM-DEXTROSE 2-4 GM/100ML-% IV SOLN: 2.0000 g | INTRAVENOUS | Status: DC

## 2016-08-29 MED ORDER — LACTATED RINGERS IV SOLN
INTRAVENOUS | Status: DC
  Administered 2016-08-29 (×2): via INTRAVENOUS

## 2016-08-29 MED ORDER — EPINEPHRINE PF 1 MG/ML IJ SOLN
INTRAMUSCULAR | Status: DC | PRN
  Administered 2016-08-29: 10 mL

## 2016-08-29 MED ORDER — ONDANSETRON HCL 4 MG PO TABS: 4.0000 mg | ORAL_TABLET | Freq: Three times a day (TID) | ORAL | 0 refills | Status: DC | PRN

## 2016-08-29 MED ORDER — LACTATED RINGERS IV SOLN: INTRAVENOUS | Status: DC

## 2016-08-29 MED ORDER — FENTANYL CITRATE (PF) 100 MCG/2ML IJ SOLN: 25.0000 ug | INTRAMUSCULAR | Status: DC | PRN

## 2016-08-29 MED ORDER — OXYCODONE HCL 5 MG PO TABS: 5.0000 mg | ORAL_TABLET | Freq: Once | ORAL | Status: DC | PRN

## 2016-08-29 MED ORDER — PROPOFOL 10 MG/ML IV BOLUS
INTRAVENOUS | Status: DC | PRN
  Administered 2016-08-29: 30 mg via INTRAVENOUS
  Administered 2016-08-29: 130 mg via INTRAVENOUS

## 2016-08-29 MED ORDER — ACETAMINOPHEN 10 MG/ML IV SOLN: 1000.0000 mg | Freq: Once | INTRAVENOUS | Status: DC | PRN

## 2016-08-29 MED ORDER — ONDANSETRON HCL 4 MG/2ML IJ SOLN: 4.0000 mg | Freq: Once | INTRAMUSCULAR | Status: DC | PRN

## 2016-08-29 SURGICAL SUPPLY — 73 items
ADAPTER IRRIG TUBE 2 SPIKE SOL (ADAPTER) ×4 IMPLANT
ANCHOR ALL-SUT Q-FIX 2.8 (Anchor) ×4 IMPLANT
ARTHROWAND PARAGON T2 (SURGICAL WAND)
BANDAGE ELASTIC 2 CLIP NS LF (GAUZE/BANDAGES/DRESSINGS) IMPLANT
BANDAGE ELASTIC 4 LF NS (GAUZE/BANDAGES/DRESSINGS) IMPLANT
BLADE SURG 15 STRL LF DISP TIS (BLADE) IMPLANT
BLADE SURG 15 STRL SS (BLADE)
BNDG STRETCH 4X75 STRL LF (GAUZE/BANDAGES/DRESSINGS) IMPLANT
BUR RADIUS 4.0X18.5 (BURR) ×2 IMPLANT
BUR RADIUS 5.5 (BURR) ×2 IMPLANT
CANNULA 5.75X7 CRYSTAL CLEAR (CANNULA) ×2 IMPLANT
CAP LOCK ULTRA CANNULA (MISCELLANEOUS) IMPLANT
CONNECTOR PERFECT PASSER (CONNECTOR) ×2 IMPLANT
DRAPE INCISE IOBAN 66X45 STRL (DRAPES) ×2 IMPLANT
DRAPE SHEET LG 3/4 BI-LAMINATE (DRAPES) IMPLANT
DRAPE STERI 35X30 U-POUCH (DRAPES) IMPLANT
DURAPREP 26ML APPLICATOR (WOUND CARE) ×2 IMPLANT
GAUZE PETRO XEROFOAM 1X8 (MISCELLANEOUS) ×2 IMPLANT
GAUZE SPONGE 4X4 12PLY STRL (GAUZE/BANDAGES/DRESSINGS) ×2 IMPLANT
GLOVE INDICATOR 8.0 STRL GRN (GLOVE) ×2 IMPLANT
GLOVE SURG 9.0 ORTHO LTXF (GLOVE) ×4 IMPLANT
GLOVE SURG ORTHO 8.5 STRL (GLOVE) ×2 IMPLANT
GOWN STRL REUS W/ TWL LRG LVL3 (GOWN DISPOSABLE) ×1 IMPLANT
GOWN STRL REUS W/TWL 2XL LVL3 (GOWN DISPOSABLE) ×2 IMPLANT
GOWN STRL REUS W/TWL LRG LVL3 (GOWN DISPOSABLE) ×1
HANDLE YANKAUER SUCT BULB TIP (MISCELLANEOUS) IMPLANT
IV LACTATED RINGER IRRG 3000ML (IV SOLUTION) ×10
IV LR IRRIG 3000ML ARTHROMATIC (IV SOLUTION) ×10 IMPLANT
KIT ROOM TURNOVER OR (KITS) IMPLANT
KIT SHOULDER TRACTION (DRAPES) ×2 IMPLANT
KIT SUTURE 2.8 Q-FIX DISP (MISCELLANEOUS) ×2 IMPLANT
MANIFOLD 4PT FOR NEPTUNE1 (MISCELLANEOUS) ×2 IMPLANT
NEEDLE FILTER BLUNT 18X 1/2SAF (NEEDLE) ×1
NEEDLE FILTER BLUNT 18X1 1/2 (NEEDLE) ×1 IMPLANT
NS IRRIG 500ML POUR BTL (IV SOLUTION) IMPLANT
PACK ARTHROSCOPY SHOULDER (MISCELLANEOUS) ×2 IMPLANT
PAD ABD DERMACEA PRESS 5X9 (GAUZE/BANDAGES/DRESSINGS) IMPLANT
PAD GROUND ADULT SPLIT (MISCELLANEOUS) ×2 IMPLANT
PASSER SUT CAPTURE FIRST (SUTURE) ×2 IMPLANT
SET TUBE SUCT SHAVER OUTFL 24K (TUBING) ×2 IMPLANT
SPONGE LAP 18X18 5 PK (GAUZE/BANDAGES/DRESSINGS) IMPLANT
STRIP CLOSURE SKIN 1/2X4 (GAUZE/BANDAGES/DRESSINGS) IMPLANT
SUT CO BRAID (SUTURE) IMPLANT
SUT ETHILON 4-0 (SUTURE) ×1
SUT ETHILON 4-0 FS2 18XMFL BLK (SUTURE) ×1
SUT ETHILON 5-0 FS-2 18 BLK (SUTURE) IMPLANT
SUT KNTLS 2.8 MAGNUM (Anchor) ×8 IMPLANT
SUT MNCRL 4-0 (SUTURE) ×1
SUT MNCRL 4-0 27XMFL (SUTURE) ×1
SUT MNCRL AB 3-0 PS2 18 (SUTURE) IMPLANT
SUT PDSII 0 (SUTURE) IMPLANT
SUT PERFECTPASSER WHITE CART (SUTURE) ×4 IMPLANT
SUT SMART STITCH CARTRIDGE (SUTURE) ×6 IMPLANT
SUT VIC AB 0 CT1 36 (SUTURE) ×2 IMPLANT
SUT VIC AB 0 CT2 27 (SUTURE) IMPLANT
SUT VIC AB 2-0 CT1 27 (SUTURE) ×1
SUT VIC AB 2-0 CT1 TAPERPNT 27 (SUTURE) ×1 IMPLANT
SUT VIC AB 2-0 SH 27 (SUTURE)
SUT VIC AB 2-0 SH 27XBRD (SUTURE) IMPLANT
SUT VIC AB 3-0 SH 27 (SUTURE)
SUT VIC AB 3-0 SH 27X BRD (SUTURE) IMPLANT
SUTURE ETHLN 4-0 FS2 18XMF BLK (SUTURE) ×1 IMPLANT
SUTURE MAGNUM WIRE 2X48 BLK (SUTURE) IMPLANT
SUTURE MNCRL 4-0 27XMF (SUTURE) ×1 IMPLANT
SUTURE OPUS MAGNUM SZ 2 WHT (SUTURE) IMPLANT
SYRINGE 10CC LL (SYRINGE) ×2 IMPLANT
TAPE MICROFOAM 4IN (TAPE) ×2 IMPLANT
TUBING ARTHRO INFLOW-ONLY STRL (TUBING) ×2 IMPLANT
TUBING CONN 6MMX3.1M (TUBING) ×1
TUBING SUCTION CONN 0.25 STRL (TUBING) ×1 IMPLANT
WAND ARTHRO PARAGON T2 (SURGICAL WAND) IMPLANT
WAND COVAC 50 IFS (MISCELLANEOUS) IMPLANT
WAND HAND CNTRL MULTIVAC 90 (MISCELLANEOUS) ×2 IMPLANT

## 2016-08-29 NOTE — H&P (Signed)
The patient has been re-examined, and the chart reviewed, and there have been no interval changes to the documented history and physical.    The risks, benefits, and alternatives have been discussed at length, and the patient is willing to proceed.   

## 2016-08-29 NOTE — Anesthesia Preprocedure Evaluation (Signed)
Anesthesia Evaluation  Patient identified by MRN, date of birth, ID band Patient awake    Reviewed: Allergy & Precautions, NPO status , Patient's Chart, lab work & pertinent test results  History of Anesthesia Complications Negative for: history of anesthetic complications  Airway Mallampati: IV  TM Distance: >3 FB Neck ROM: Full    Dental  (+)    Pulmonary former smoker (quit 1976),  Snoring    Pulmonary exam normal breath sounds clear to auscultation       Cardiovascular Exercise Tolerance: Good hypertension, Normal cardiovascular exam Rhythm:Regular Rate:Normal  ECG 08/11/16: normal per Dr. Jeananne Rama   Neuro/Psych negative neurological ROS     GI/Hepatic negative GI ROS,   Endo/Other  diabetes, Well Controlled, Type 2  Renal/GU negative Renal ROS     Musculoskeletal   Abdominal   Peds  Hematology negative hematology ROS (+)   Anesthesia Other Findings   Reproductive/Obstetrics                             Anesthesia Physical Anesthesia Plan  ASA: II  Anesthesia Plan: General and Regional   Post-op Pain Management:  Regional for Post-op pain   Induction: Intravenous  PONV Risk Score and Plan: 1 and Ondansetron and Dexamethasone  Airway Management Planned: LMA  Additional Equipment:   Intra-op Plan:   Post-operative Plan: Extubation in OR  Informed Consent: I have reviewed the patients History and Physical, chart, labs and discussed the procedure including the risks, benefits and alternatives for the proposed anesthesia with the patient or authorized representative who has indicated his/her understanding and acceptance.     Plan Discussed with: CRNA  Anesthesia Plan Comments:         Anesthesia Quick Evaluation

## 2016-08-29 NOTE — Anesthesia Postprocedure Evaluation (Signed)
Anesthesia Post Note  Patient: Philip Ryan.  Procedure(s) Performed: Procedure(s) (LRB): SHOULDER ARTHROSCOPY WITH OPEN ROTATOR CUFF REPAIR AND DISTAL CLAVICLE ACROMINECTOMY (Left) SHOULDER ARTHROSCOPY WITH BICEPS TENDON REPAIR (Left)  Patient location during evaluation: PACU Anesthesia Type: Regional Level of consciousness: awake and alert, oriented and patient cooperative Pain management: pain level controlled Vital Signs Assessment: post-procedure vital signs reviewed and stable Respiratory status: spontaneous breathing, nonlabored ventilation and respiratory function stable Cardiovascular status: blood pressure returned to baseline and stable Postop Assessment: adequate PO intake Anesthetic complications: no    Darrin Nipper

## 2016-08-29 NOTE — Anesthesia Procedure Notes (Signed)
Anesthesia Regional Block: Interscalene brachial plexus block   Pre-Anesthetic Checklist: ,, timeout performed, Correct Patient, Correct Site, Correct Laterality, Correct Procedure, Correct Position, site marked, Risks and benefits discussed,  Surgical consent,  Pre-op evaluation,  At surgeon's request and post-op pain management  Laterality: Left  Prep: chloraprep       Needles:  Injection technique: Single-shot  Needle Type: Stimiplex     Needle Length: 10cm  Needle Gauge: 21     Additional Needles:   Procedures: ultrasound guided,,,,,,,,  Narrative:  Start time: 08/29/2016 12:26 PM End time: 08/29/2016 12:34 PM Injection made incrementally with aspirations every 5 mL.  Performed by: Personally  Anesthesiologist: Darrin Nipper  Additional Notes: Functioning IV was confirmed and monitors applied. Ultrasound guidance: relevant anatomy identified, needle position confirmed, local anesthetic spread visualized around nerve(s)., vascular puncture avoided.  Image printed for medical record.  Negative aspiration and no paresthesias; incremental administration of local anesthetic. The patient tolerated the procedure well.  Vital signs recorded in RN notes.

## 2016-08-29 NOTE — Op Note (Signed)
08/29/2016  5:26 PM  PATIENT:  Philip Ryan.  61 y.o. male  PRE-OPERATIVE DIAGNOSIS:  Full thickness tear of the left supra and infraspinatus, subacromial impingement, acromioclavicular joint arthrosis and possible partial tear of the biceps tendon  POST-OPERATIVE DIAGNOSIS:   Full thickness tear of the left supra and infraspinatus, subacromial impingement, acromioclavicular joint arthrosis and type II SLAP tear with partial tear of the biceps tendon  PROCEDURE:  Procedure(s) with comments: 1.  LEFT SHOULDER ARTHROSCOPY WITH OPEN ROTATOR CUFF REPAIR   2.  OPEN BICEPS TENODESIS 3.  ARTHROSCOPIC DISTAL CLAVICLE EXCISION,  4.  ARTHROSCOPIC SUBACROMIAL DECOMPRESSION   5.  DEBRIDEMENT OF SUPERIOR LABRUM    SURGEON:  Surgeon(s) and Role:    Juanell Fairly, MD - Primary  ANESTHESIA:   local, general and paracervical block  PREOPERATIVE INDICATIONS:  Philip Ryan. is a  61 y.o. male with a diagnosis of full thickness rotator cuff tear with retraction confirmed by MRI. Given the patient's weakness, loss of motion and left shoulder pain it was recommended he proceed with operative fixation of this tear. 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  The risks benefits and alternatives were discussed with the patient preoperatively including but not limited to the risks of infection, bleeding, nerve injury, persistent pain or weakness, failure of the hardware, re-tear of the rotator cuff and the need for further surgery. Medical risks include DVT and pulmonary embolism, myocardial infarction, stroke, pneumonia, respiratory failure and death. Patient understood these risks and wished to proceed.  OPERATIVE IMPLANTS: ArthroCare Magnum 2 anchors x 4 & Smith and Nephew Q Fix anchors x 2  OPERATIVE FINDINGS: Full-thickness and retracted tear of the supra and infraspinatus to the level of the top of the humeral head.  Patient had a type II SLAP tear with significant fraying of the superior labrum.  Patient had significant subacromial bursitis and subacromial spurring. He also degenerative changes at the acromioclavicular joint. Patient significant synovitis in the anterior mandible joint with mild fraying of the superior aspect of the subscapularis tendon without full-thickness tear.  OPERATIVE PROCEDURE: The patient was met in the preoperative area. His wife and father at the bedside. I answered all questions by the patient and his family. The left shoulder was signed with the word yes and my initials according the hospital's correct site of surgery protocol. The patient underwent placement of a left interscalene block by the anesthesia service.  Patient was brought to the operating room where he underwent general endotracheal intubation following  interscalene block . The patient was placed in a beachchair position. A spider arm positioner was used for this case. Examination under anesthesia revealed no limitation of motion or instability with load shift testing. The patient had a negative sulcus sign.  The patient was prepped and draped in a sterile fashion. A timeout was performed to verify the patient's name, date of birth, medical record number, correct site of surgery and correct procedure to be performed there was also used to verify the patient received antibiotics that all appropriate instruments, implants and radiographs studies were available in the room. Once all in attendance were in agreement case began.  Bony landmarks were drawn out with a surgical marker along with proposed arthroscopy incisions. These were pre-injected with 1% lidocaine plain. An 11 blade was used to establish a posterior portal through which the arthroscope was placed in the glenohumeral joint. A full  diagnostic examination of the shoulder was performed. the anterior portal was established under direct visualization with an 18-gauge  spinal needle. A 5.75 the medial arthroscopic cannula was placed through this anterior portal.   The intra-articular portion of the biceps tendon was found to have advanced intratendinous degeneration with significant thickening of the tendon and a type II SLAP tear.  Given the partial tear of the biceps with instability of the biceps anchor, the decision was made to perform a biceps tenodesis. An Arthocare Perfect Pass suture placed in the biceps tendon and a tenotomy was performed arthroscopicallt using an 90 ArthroCare wand. Perfect pass suture was clamped with a hemostat for later tenodesis.  The biceps tendon stump remaining on the superior labrum was debrided with a 4.0 mm resector shaver blade and 90 ArthroCare wand.  The arthroscope was then placed in the subacromial space. A lateral portal was then established using an 18-gauge spinal needle for localization.Extensive bursitis was encountered and debrided using a 4.0 resector shaver blade and a 90 ArthroCare wand from the lateral portal. A subacromial decompression was also performed using a 5.5 mm resector shaver blade from the lateral portal and a distal clavicle excision was performed from the anterior portal also using the 5.5 mm resector shaver blade.    Three Arthrocare Perfect Pass suture was placed in the lateral border of the rotator cuff tear. The greater tuberosity was debrided using a 5.5 mm resector shaver blade to remove all remaining foreign fibers of the rotator cuff. Debridement was performed until punctate bleeding was seen at the greater tuberosity footprint, which will facilitate rotator cuff healing.  Final arthroscopic images were taken and all arthroscopic instruments were then removed and the mini-open portion of the procedure began.   A saber-type incision was made along the lateral border of the acromion. The deltoid muscle was identified and split in line with its fibers which allowed visualization of the rotator  cuff. The biceps tendon and its associated tagging suture were brought out through the deltoid split. The Perfect Pass suture previously placed in the lateral border of the rotator cuff was also brought out through the deltoid split.   A second perfect pass suturewas placed more distally in the biceps tendon.Approximately 10 mm of the biceps tendon was resected.   A tenodesis was then performed by fixing the biceps tendon at the top of the bicipital groove with a single Arthrocare Magnum 2 anchor.   The attention was then turned back to the rotator cuff. The lateral edge of the rotator cuff was debrided until healthy, viable tissue remained. Two Q fix anchors were then placed at the articular margin of the humeral head. The 4 limbs of each Q fix anchor were then passed medially through the rotator cuff with a First Pass suture passer. These suture limbs were then clamped with a hemostat for later medial row fixation.  The 3 perfect pass sutures from the lateral border of the rotator cuff were then anchored to thegreater tuberosity of the humeral head using 3 Magnum 2 anchors. These anchors were tensioned to allow for anatomic reduction of the rotator cuff to the greater tuberosity footprint. The medial row repair was then completed using an arthroscopic knot tying technique with the Q fix anchor sutures. Once all sutures were tied down, arthroscopic images of thedouble row repair were taken arthroscopically from within the glenohumeral joint.  All incisions were copiously irrigated. The deltoid fascia was repaired using a 0 Vicryl suturean interrupted fashion. The  subcutaneous tissue of all incisions were closed with a 2-0 Vicryl. Skin closure for the arthroscopic incisions was performed with 4-0 nylon. The skin edges of the saber incision were approximated with a running 4-0 undyed Monocryl. A dry sterile dressing was applied. The patient was placed in an abduction sling, with a Polar Care  sleeve.  All sharp and instrument counts were correct at the conclusion of the case. I was scrubbed and present for the entire case. I spoke with the patient's family postoperatively to let them know the case had been performed without complication and the patient was stable in recovery room.

## 2016-08-29 NOTE — Progress Notes (Signed)
Assisted Dr. Erenest Rasher with left, ultrasound guided, interscalene  block. Side rails up, monitors on throughout procedure. See vital signs in flow sheet. Tolerated Procedure well.

## 2016-08-29 NOTE — Transfer of Care (Signed)
Immediate Anesthesia Transfer of Care Note  Patient: Philip Ryan.  Procedure(s) Performed: Procedure(s) with comments: SHOULDER ARTHROSCOPY WITH OPEN ROTATOR CUFF REPAIR AND DISTAL CLAVICLE ACROMINECTOMY (Left) - Sleep Apnea - No CPAP SHOULDER ARTHROSCOPY WITH BICEPS TENDON REPAIR (Left)  Patient Location: PACU  Anesthesia Type: General, Regional  Level of Consciousness: awake, alert  and patient cooperative  Airway and Oxygen Therapy: Patient Spontanous Breathing and Patient connected to supplemental oxygen  Post-op Assessment: Post-op Vital signs reviewed, Patient's Cardiovascular Status Stable, Respiratory Function Stable, Patent Airway and No signs of Nausea or vomiting  Post-op Vital Signs: Reviewed and stable  Complications: No apparent anesthesia complications

## 2016-08-29 NOTE — Anesthesia Procedure Notes (Signed)
Procedure Name: LMA Insertion Date/Time: 08/29/2016 1:20 PM Performed by: Mayme Genta Pre-anesthesia Checklist: Patient identified, Emergency Drugs available, Suction available, Timeout performed and Patient being monitored Patient Re-evaluated:Patient Re-evaluated prior to induction Oxygen Delivery Method: Circle system utilized Preoxygenation: Pre-oxygenation with 100% oxygen Induction Type: IV induction LMA: LMA inserted LMA Size: 4.0 Number of attempts: 1 Placement Confirmation: positive ETCO2 and breath sounds checked- equal and bilateral Tube secured with: Tape

## 2016-08-31 ENCOUNTER — Encounter: Payer: Self-pay | Admitting: Orthopedic Surgery

## 2016-09-05 ENCOUNTER — Telehealth: Payer: Self-pay | Admitting: Family Medicine

## 2016-09-05 NOTE — Telephone Encounter (Signed)
Last one done 09/08/15, " triamcinolone acetonide (KENALOG-40) injection 40 mg  [[id number]]   Ordered Dose: 40 mg Route: Intramuscular Frequency: Once  Administration Dose: 40 mg     Scheduled Start Date/Time: 09/08/15 1145 End Date/Time: 09/08/15 1135 after 1 doses      Diagnosis Association: Allergy, sequela (T78.40XS)    Order Status: Completed Tue Sep 08, 2015 1135, originally scheduled to end   Ordering User: Guadalupe Maple, MD Ordering Date/Time: Tue Sep 08, 2015 1131  Ordering Provider: Guadalupe Maple, MD Authorizing Provider: Guadalupe Maple, MD  "   Please advise.

## 2016-09-05 NOTE — Telephone Encounter (Signed)
Any time no OV

## 2016-09-05 NOTE — Telephone Encounter (Signed)
Patient called to see when he can come in for his yearly allergy injection. Please advise.   Thank you  [phone]

## 2016-09-06 ENCOUNTER — Ambulatory Visit (INDEPENDENT_AMBULATORY_CARE_PROVIDER_SITE_OTHER): Payer: BLUE CROSS/BLUE SHIELD

## 2016-09-06 DIAGNOSIS — T7840XD Allergy, unspecified, subsequent encounter: Secondary | ICD-10-CM | POA: Diagnosis not present

## 2016-09-06 MED ORDER — TRIAMCINOLONE ACETONIDE 40 MG/ML IJ SUSP
40.0000 mg | Freq: Once | INTRAMUSCULAR | Status: AC
  Administered 2016-09-06: 40 mg via INTRAMUSCULAR

## 2016-09-06 NOTE — Telephone Encounter (Signed)
Noted  

## 2016-09-06 NOTE — Telephone Encounter (Signed)
Patient plans to stop by the office today for allergy injection.  Just FYI

## 2016-10-11 LAB — HM DIABETES EYE EXAM

## 2016-10-12 ENCOUNTER — Ambulatory Visit (INDEPENDENT_AMBULATORY_CARE_PROVIDER_SITE_OTHER): Payer: BLUE CROSS/BLUE SHIELD | Admitting: Family Medicine

## 2016-10-12 ENCOUNTER — Telehealth: Payer: Self-pay

## 2016-10-12 ENCOUNTER — Encounter: Payer: Self-pay | Admitting: Family Medicine

## 2016-10-12 VITALS — BP 128/86 | HR 68 | Wt 210.0 lb

## 2016-10-12 DIAGNOSIS — E118 Type 2 diabetes mellitus with unspecified complications: Secondary | ICD-10-CM

## 2016-10-12 DIAGNOSIS — E785 Hyperlipidemia, unspecified: Secondary | ICD-10-CM

## 2016-10-12 DIAGNOSIS — I10 Essential (primary) hypertension: Secondary | ICD-10-CM | POA: Diagnosis not present

## 2016-10-12 DIAGNOSIS — Z23 Encounter for immunization: Secondary | ICD-10-CM | POA: Diagnosis not present

## 2016-10-12 LAB — BAYER DCA HB A1C WAIVED: HB A1C (BAYER DCA - WAIVED): 7 % — ABNORMAL HIGH (ref <7.0)

## 2016-10-12 NOTE — Progress Notes (Signed)
BP 128/86 (BP Location: Left Arm)   Pulse 68   Wt 210 lb (95.3 kg)   SpO2 98%   BMI 30.13 kg/m    Subjective:    Patient ID: Philip Dunn., male    DOB: 02/24/1955, 61 y.o.   MRN: [id number]  HPI: Philip Anglemyer. is a 61 y.o. male  Chief Complaint  Patient presents with  . Follow-up  . Diabetes  Follow-up diabetes patient all in all doing well has fallen off the wagon with some weight gain due to shoulder surgery which is recovering well without problems. Blood pressure remains doing well. Shoulder surgery recovery is doing well. Patient had a scheduled with his physical therapy and only uses Tylenol for pain control.  Relevant past medical, surgical, family and social history reviewed and updated as indicated. Interim medical history since our last visit reviewed. Allergies and medications reviewed and updated.  Review of Systems  Constitutional: Negative.   Respiratory: Negative.   Cardiovascular: Negative.     Per HPI unless specifically indicated above     Objective:    BP 128/86 (BP Location: Left Arm)   Pulse 68   Wt 210 lb (95.3 kg)   SpO2 98%   BMI 30.13 kg/m   Wt Readings from Last 3 Encounters:  10/12/16 210 lb (95.3 kg)  08/29/16 200 lb (90.7 kg)  08/11/16 207 lb (93.9 kg)    Physical Exam  Constitutional: He is oriented to person, place, and time. He appears well-developed and well-nourished.  HENT:  Head: Normocephalic and atraumatic.  Eyes: Conjunctivae and EOM are normal.  Neck: Normal range of motion.  Cardiovascular: Normal rate, regular rhythm and normal heart sounds.   Pulmonary/Chest: Effort normal and breath sounds normal.  Musculoskeletal: Normal range of motion.  Neurological: He is alert and oriented to person, place, and time.  Skin: No erythema.  Psychiatric: He has a normal mood and affect. His behavior is normal. Judgment and thought content normal.    Results for orders placed or performed during the hospital  encounter of 08/29/16  APTT  Result Value Ref Range   aPTT 28 24 - 36 seconds  Basic metabolic panel  Result Value Ref Range   Sodium 141 135 - 145 mmol/L   Potassium 4.3 3.5 - 5.1 mmol/L   Chloride 104 101 - 111 mmol/L   CO2 28 22 - 32 mmol/L   Glucose, Bld 128 (H) 65 - 99 mg/dL   BUN 13 6 - 20 mg/dL   Creatinine, Ser 0.87 0.61 - 1.24 mg/dL   Calcium 9.6 8.9 - 10.3 mg/dL   GFR calc non Af Amer >60 >60 mL/min   GFR calc Af Amer >60 >60 mL/min   Anion gap 9 5 - 15  CBC WITH DIFFERENTIAL  Result Value Ref Range   WBC 5.8 3.8 - 10.6 K/uL   RBC 5.30 4.40 - 5.90 MIL/uL   Hemoglobin 15.7 13.0 - 18.0 g/dL   HCT 46.2 40.0 - 52.0 %   MCV 87.2 80.0 - 100.0 fL   MCH 29.6 26.0 - 34.0 pg   MCHC 33.9 32.0 - 36.0 g/dL   RDW 12.9 11.5 - 14.5 %   Platelets 203 150 - 440 K/uL   Neutrophils Relative % 62 %   Neutro Abs 3.6 1.4 - 6.5 K/uL   Lymphocytes Relative 27 %   Lymphs Abs 1.6 1.0 - 3.6 K/uL   Monocytes Relative 5 %   Monocytes Absolute 0.3 0.2 -  1.0 K/uL   Eosinophils Relative 5 %   Eosinophils Absolute 0.3 0 - 0.7 K/uL   Basophils Relative 1 %   Basophils Absolute 0.1 0 - 0.1 K/uL  Protime-INR  Result Value Ref Range   Prothrombin Time 13.5 11.4 - 15.2 seconds   INR 1.03   Glucose, capillary  Result Value Ref Range   Glucose-Capillary 147 (H) 65 - 99 mg/dL  Glucose, capillary  Result Value Ref Range   Glucose-Capillary 179 (H) 65 - 99 mg/dL      Assessment & Plan:   Problem List Items Addressed This Visit      Cardiovascular and Mediastinum   Hypertension    The current medical regimen is effective;  continue present plan and medications.       Relevant Orders   Bayer DCA Hb A1c Waived     Endocrine   Diabetes mellitus ([place]) - Primary    The current medical regimen is effective;  continue present plan and medications.       Relevant Orders   Bayer DCA Hb A1c Waived     Other   Hyperlipidemia   Relevant Orders   Bayer DCA Hb A1c Waived    Other Visit  Diagnoses    Needs flu shot       Relevant Orders   Flu Vaccine QUAD 6+ mos PF IM (Fluarix Quad PF) (Completed)       Follow up plan: Return in about 3 months (around 01/11/2017) for Hemoglobin A1c, BMP.

## 2016-10-12 NOTE — Assessment & Plan Note (Signed)
The current medical regimen is effective;  continue present plan and medications.  

## 2016-10-12 NOTE — Telephone Encounter (Signed)
Fax from [facility] came into the Lab. Lab staff gave me the form for Information Request. On the fax: [employer] is requesting itemized bill with CPT & ICD-10 codes and Medical records. There is also a signed authorization attached to form.   The form has been placed in your mailbox, along with a note attached as well referring to encounter that has been sent.     Thank you

## 2016-10-12 NOTE — Patient Instructions (Addendum)

## 2016-10-13 LAB — HM DIABETES EYE EXAM

## 2016-10-24 NOTE — Telephone Encounter (Signed)
Sent request to [employer] in [place] for processing.

## 2017-01-18 ENCOUNTER — Ambulatory Visit: Payer: BLUE CROSS/BLUE SHIELD | Admitting: Family Medicine

## 2017-02-06 ENCOUNTER — Encounter: Payer: Self-pay | Admitting: Family Medicine

## 2017-02-06 ENCOUNTER — Ambulatory Visit: Payer: BLUE CROSS/BLUE SHIELD | Admitting: Family Medicine

## 2017-02-06 VITALS — BP 128/87 | HR 84 | Wt 210.0 lb

## 2017-02-06 DIAGNOSIS — Z1159 Encounter for screening for other viral diseases: Secondary | ICD-10-CM | POA: Diagnosis not present

## 2017-02-06 DIAGNOSIS — E785 Hyperlipidemia, unspecified: Secondary | ICD-10-CM | POA: Diagnosis not present

## 2017-02-06 DIAGNOSIS — Z114 Encounter for screening for human immunodeficiency virus [HIV]: Secondary | ICD-10-CM

## 2017-02-06 DIAGNOSIS — E118 Type 2 diabetes mellitus with unspecified complications: Secondary | ICD-10-CM

## 2017-02-06 DIAGNOSIS — I10 Essential (primary) hypertension: Secondary | ICD-10-CM | POA: Diagnosis not present

## 2017-02-06 LAB — BAYER DCA HB A1C WAIVED: HB A1C: 7.2 % — AB (ref <7.0)

## 2017-02-06 NOTE — Assessment & Plan Note (Signed)
Discuss cholesterol care and treatment risk factors and medication will recheck at physical.

## 2017-02-06 NOTE — Assessment & Plan Note (Signed)
The current medical regimen is effective;  continue present plan and medications.  

## 2017-02-06 NOTE — Progress Notes (Signed)
BP 128/87   Pulse 84   Wt 210 lb (95.3 kg)   SpO2 96%   BMI 30.13 kg/m    Subjective:    Patient ID: Philip Dunn., male    DOB: [birth year], 62 y.o.   MRN: [id number]  HPI: Philip Stjames. is a 62 y.o. male  Chief Complaint  Patient presents with  . Follow-up  . Diabetes  Diabetes doing well has had an enjoyable holiday season.  This also unfortunately includes weight gain.  On chart review patient had a similar enjoyable holiday season last year with elevated A1c.  Patient subsequently had good A1c's afterwards.  No low blood sugar spells. Blood pressure doing well Reviewed elevated cholesterol of discussed lipid care and treatment and use of medications.  Patient will consider and will review again at physical in May.  Relevant past medical, surgical, family and social history reviewed and updated as indicated. Interim medical history since our last visit reviewed. Allergies and medications reviewed and updated.  Review of Systems  Constitutional: Negative.   Respiratory: Negative.   Cardiovascular: Negative.     Per HPI unless specifically indicated above     Objective:    BP 128/87   Pulse 84   Wt 210 lb (95.3 kg)   SpO2 96%   BMI 30.13 kg/m   Wt Readings from Last 3 Encounters:  02/06/17 210 lb (95.3 kg)  10/12/16 210 lb (95.3 kg)  08/29/16 200 lb (90.7 kg)    Physical Exam  Constitutional: He is oriented to person, place, and time. He appears well-developed and well-nourished.  HENT:  Head: Normocephalic and atraumatic.  Eyes: Conjunctivae and EOM are normal.  Neck: Normal range of motion.  Cardiovascular: Normal rate, regular rhythm and normal heart sounds.  Pulmonary/Chest: Effort normal and breath sounds normal.  Musculoskeletal: Normal range of motion.  Neurological: He is alert and oriented to person, place, and time.  Skin: No erythema.  Psychiatric: He has a normal mood and affect. His behavior is normal. Judgment and thought  content normal.    Results for orders placed or performed in visit on 10/19/16  HM DIABETES EYE EXAM  Result Value Ref Range   HM Diabetic Eye Exam No Retinopathy No Retinopathy      Assessment & Plan:   Problem List Items Addressed This Visit      Cardiovascular and Mediastinum   Hypertension - Primary    The current medical regimen is effective;  continue present plan and medications.       Relevant Orders   Bayer DCA Hb A1c Waived   Basic metabolic panel     Endocrine   Diabetes mellitus ([place])    The current medical regimen is effective;  continue present plan and medications.       Relevant Orders   Bayer DCA Hb A1c Waived   Basic metabolic panel     Other   Hyperlipidemia    Discuss cholesterol care and treatment risk factors and medication will recheck at physical.      Relevant Orders   Bayer DCA Hb A1c Waived   Basic metabolic panel    Other Visit Diagnoses    Encounter for screening for HIV       Relevant Orders   HIV antibody   Need for hepatitis C screening test       Relevant Orders   Hepatitis C antibody       Follow up plan: Return in about 3  months (around 05/07/2017) for Physical Exam, Hemoglobin A1c.

## 2017-02-07 ENCOUNTER — Encounter: Payer: Self-pay | Admitting: Family Medicine

## 2017-02-07 LAB — BASIC METABOLIC PANEL
BUN/Creatinine Ratio: 17 (ref 10–24)
BUN: 13 mg/dL (ref 8–27)
CO2: 24 mmol/L (ref 20–29)
CREATININE: 0.78 mg/dL (ref 0.76–1.27)
Calcium: 9.4 mg/dL (ref 8.6–10.2)
Chloride: 102 mmol/L (ref 96–106)
GFR, EST AFRICAN AMERICAN: 113 mL/min/{1.73_m2} (ref >59)
GFR, EST NON AFRICAN AMERICAN: 98 mL/min/{1.73_m2} (ref >59)
Glucose: 171 mg/dL — ABNORMAL HIGH (ref 65–99)
Potassium: 4.4 mmol/L (ref 3.5–5.2)
SODIUM: 142 mmol/L (ref 134–144)

## 2017-02-07 LAB — HEPATITIS C ANTIBODY: Hep C Virus Ab: 0.1 s/co ratio (ref 0.0–0.9)

## 2017-02-07 LAB — HIV ANTIBODY (ROUTINE TESTING W REFLEX): HIV SCREEN 4TH GENERATION: NONREACTIVE
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
## 2017-06-15 ENCOUNTER — Encounter: Payer: BLUE CROSS/BLUE SHIELD | Admitting: Family Medicine

## 2017-06-22 ENCOUNTER — Other Ambulatory Visit: Payer: Self-pay | Admitting: Family Medicine

## 2017-06-22 DIAGNOSIS — I10 Essential (primary) hypertension: Secondary | ICD-10-CM

## 2017-06-22 DIAGNOSIS — E119 Type 2 diabetes mellitus without complications: Secondary | ICD-10-CM

## 2017-07-19 ENCOUNTER — Other Ambulatory Visit: Payer: Self-pay | Admitting: Family Medicine

## 2017-07-19 DIAGNOSIS — I10 Essential (primary) hypertension: Secondary | ICD-10-CM

## 2017-07-19 DIAGNOSIS — E119 Type 2 diabetes mellitus without complications: Secondary | ICD-10-CM

## 2017-07-19 NOTE — Telephone Encounter (Signed)
amlodipine refill Last Refill:06/22/17 # 30 tab NO RF Last OV: 02/06/17 PCP: Dr Jeananne Rama Pharmacy:[facility]   telmisartan refill Last Refill:06/22/17 # 30 No RF Last OV: 02/06/17    xigduo refill Last Refill:06/22/17 # 60 No RF Last OV: 02/06/17

## 2017-08-02 ENCOUNTER — Ambulatory Visit: Payer: BLUE CROSS/BLUE SHIELD | Admitting: Family Medicine

## 2017-08-02 ENCOUNTER — Encounter: Payer: Self-pay | Admitting: Family Medicine

## 2017-08-02 VITALS — BP 126/80 | HR 94 | Ht 69.0 in | Wt 202.0 lb

## 2017-08-02 DIAGNOSIS — E119 Type 2 diabetes mellitus without complications: Secondary | ICD-10-CM

## 2017-08-02 DIAGNOSIS — I10 Essential (primary) hypertension: Secondary | ICD-10-CM | POA: Diagnosis not present

## 2017-08-02 DIAGNOSIS — Z0001 Encounter for general adult medical examination with abnormal findings: Secondary | ICD-10-CM | POA: Diagnosis not present

## 2017-08-02 DIAGNOSIS — E118 Type 2 diabetes mellitus with unspecified complications: Secondary | ICD-10-CM

## 2017-08-02 DIAGNOSIS — E785 Hyperlipidemia, unspecified: Secondary | ICD-10-CM | POA: Diagnosis not present

## 2017-08-02 DIAGNOSIS — Z0000 Encounter for general adult medical examination without abnormal findings: Secondary | ICD-10-CM

## 2017-08-02 LAB — BAYER DCA HB A1C WAIVED: HB A1C (BAYER DCA - WAIVED): 6.9 % (ref <7.0)

## 2017-08-02 LAB — URINALYSIS, ROUTINE W REFLEX MICROSCOPIC
Bilirubin, UA: NEGATIVE
Leukocytes, UA: NEGATIVE
NITRITE UA: NEGATIVE
Protein, UA: NEGATIVE
RBC, UA: NEGATIVE
Specific Gravity, UA: 1.015 (ref 1.005–1.030)
UUROB: 0.2 mg/dL (ref 0.2–1.0)
pH, UA: 7 (ref 5.0–7.5)

## 2017-08-02 MED ORDER — AMLODIPINE BESYLATE 10 MG PO TABS: 10.0000 mg | ORAL_TABLET | Freq: Every day | ORAL | 4 refills | Status: DC

## 2017-08-02 MED ORDER — TELMISARTAN 80 MG PO TABS: 80.0000 mg | ORAL_TABLET | Freq: Every day | ORAL | 4 refills | Status: DC

## 2017-08-02 MED ORDER — DAPAGLIFLOZIN PRO-METFORMIN ER 5-1000 MG PO TB24: 1.0000 | ORAL_TABLET | Freq: Two times a day (BID) | ORAL | 4 refills | Status: DC

## 2017-08-02 NOTE — Addendum Note (Signed)
Addended by: Golden Pop A on: 08/02/2017 08:48 AM   Modules accepted: Orders

## 2017-08-02 NOTE — Assessment & Plan Note (Signed)
Discussed starting medications if cholesterol still up discussed risk and benefits and side effects.

## 2017-08-02 NOTE — Assessment & Plan Note (Signed)
The current medical regimen is effective;  continue present plan and medications.  

## 2017-08-02 NOTE — Progress Notes (Signed)
BP 126/80   Pulse 94   Ht 5\' 9"  (1.753 m)   Wt 202 lb (91.6 kg)   SpO2 98%   BMI 29.83 kg/m    Subjective:    Patient ID: Philip Dunn., male    DOB: 02/23/1955, 62 y.o.   MRN: [id number]  HPI: Philip Naval. is a 62 y.o. male  Chief Complaint  Patient presents with  . Annual Exam   Patient follow-up all in all doing well taking blood sugar medicine without any low blood sugar spells or any issues taking faithfully without problems. Blood pressure also doing well with medications no complaints. Reviewed cholesterol not taking medications does not really want to has had elevated cholesterol last year but is lost 8 pounds and and is hopeful cholesterol is better reviewed risk benefits of cholesterol medications. Relevant past medical, surgical, family and social history reviewed and updated as indicated. Interim medical history since our last visit reviewed. Allergies and medications reviewed and updated.  Review of Systems  Constitutional: Negative.   HENT: Negative.   Eyes: Negative.   Respiratory: Negative.   Cardiovascular: Negative.   Gastrointestinal: Negative.   Endocrine: Negative.   Genitourinary: Negative.   Musculoskeletal: Negative.   Skin: Negative.   Allergic/Immunologic: Negative.   Neurological: Negative.   Hematological: Negative.   Psychiatric/Behavioral: Negative.     Per HPI unless specifically indicated above     Objective:    BP 126/80   Pulse 94   Ht 5\' 9"  (1.753 m)   Wt 202 lb (91.6 kg)   SpO2 98%   BMI 29.83 kg/m   Wt Readings from Last 3 Encounters:  08/02/17 202 lb (91.6 kg)  02/06/17 210 lb (95.3 kg)  10/12/16 210 lb (95.3 kg)    Physical Exam  Constitutional: He is oriented to person, place, and time. He appears well-developed and well-nourished.  HENT:  Head: Normocephalic and atraumatic.  Right Ear: External ear normal.  Left Ear: External ear normal.  Eyes: Pupils are equal, round, and reactive to light.  Conjunctivae and EOM are normal.  Neck: Normal range of motion. Neck supple.  Cardiovascular: Normal rate, regular rhythm, normal heart sounds and intact distal pulses.  Pulmonary/Chest: Effort normal and breath sounds normal.  Abdominal: Soft. Bowel sounds are normal. There is no splenomegaly or hepatomegaly.  Genitourinary: Rectum normal, prostate normal and penis normal.  Musculoskeletal: Normal range of motion.  Neurological: He is alert and oriented to person, place, and time. He has normal reflexes.  Skin: No rash noted. No erythema.  Psychiatric: He has a normal mood and affect. His behavior is normal. Judgment and thought content normal.    Results for orders placed or performed in visit on 02/06/17  Bayer DCA Hb A1c Waived  Result Value Ref Range   HB A1C (BAYER DCA - WAIVED) 7.2 (H) <2.4 %  Basic metabolic panel  Result Value Ref Range   Glucose 171 (H) 65 - 99 mg/dL   BUN 13 8 - 27 mg/dL   Creatinine, Ser 0.78 0.76 - 1.27 mg/dL   GFR calc non Af Amer 98 >59 mL/min/1.73   GFR calc Af Amer 113 >59 mL/min/1.73   BUN/Creatinine Ratio 17 10 - 24   Sodium 142 134 - 144 mmol/L   Potassium 4.4 3.5 - 5.2 mmol/L   Chloride 102 96 - 106 mmol/L   CO2 24 20 - 29 mmol/L   Calcium 9.4 8.6 - 10.2 mg/dL  Hepatitis C antibody  Result Value Ref Range   Hep C Virus Ab 0.1 0.0 - 0.9 s/co ratio  HIV antibody  Result Value Ref Range   HIV Screen 4th Generation wRfx Non Reactive Non Reactive      Assessment & Plan:   Problem List Items Addressed This Visit      Cardiovascular and Mediastinum   Hypertension    The current medical regimen is effective;  continue present plan and medications.       Relevant Medications   telmisartan (MICARDIS) 80 MG tablet   amLODipine (NORVASC) 10 MG tablet     Endocrine   Diabetes mellitus ([place])    The current medical regimen is effective;  continue present plan and medications.       Relevant Medications   Dapagliflozin-metFORMIN HCl ER  (XIGDUO XR) 05-998 MG TB24   telmisartan (MICARDIS) 80 MG tablet     Other   Hyperlipidemia    Discussed starting medications if cholesterol still up discussed risk and benefits and side effects.      Relevant Medications   telmisartan (MICARDIS) 80 MG tablet   amLODipine (NORVASC) 10 MG tablet       Follow up plan: Return in about 6 months (around 02/02/2018) for BMP,  Lipids, ALT, AST, Hemoglobin A1c.

## 2017-08-03 ENCOUNTER — Other Ambulatory Visit: Payer: Self-pay | Admitting: Family Medicine

## 2017-08-03 ENCOUNTER — Encounter: Payer: Self-pay | Admitting: Family Medicine

## 2017-08-03 LAB — COMPREHENSIVE METABOLIC PANEL
ALT: 17 IU/L (ref 0–44)
AST: 14 IU/L (ref 0–40)
Albumin/Globulin Ratio: 2 (ref 1.2–2.2)
Albumin: 4.7 g/dL (ref 3.6–4.8)
Alkaline Phosphatase: 76 IU/L (ref 39–117)
BILIRUBIN TOTAL: 0.9 mg/dL (ref 0.0–1.2)
BUN/Creatinine Ratio: 16 (ref 10–24)
BUN: 12 mg/dL (ref 8–27)
CALCIUM: 9.4 mg/dL (ref 8.6–10.2)
CHLORIDE: 106 mmol/L (ref 96–106)
CO2: 21 mmol/L (ref 20–29)
Creatinine, Ser: 0.77 mg/dL (ref 0.76–1.27)
GFR calc non Af Amer: 98 mL/min/{1.73_m2} (ref >59)
GFR, EST AFRICAN AMERICAN: 113 mL/min/{1.73_m2} (ref >59)
GLUCOSE: 148 mg/dL — AB (ref 65–99)
Globulin, Total: 2.3 g/dL (ref 1.5–4.5)
Potassium: 4.5 mmol/L (ref 3.5–5.2)
Sodium: 144 mmol/L (ref 134–144)
Total Protein: 7 g/dL (ref 6.0–8.5)

## 2017-08-03 LAB — CBC WITH DIFFERENTIAL/PLATELET
Basophils Absolute: 0.1 10*3/uL (ref 0.0–0.2)
Basos: 1 %
EOS (ABSOLUTE): 0.2 10*3/uL (ref 0.0–0.4)
Eos: 4 %
HEMATOCRIT: 47.8 % (ref 37.5–51.0)
HEMOGLOBIN: 16.1 g/dL (ref 13.0–17.7)
IMMATURE GRANS (ABS): 0 10*3/uL (ref 0.0–0.1)
IMMATURE GRANULOCYTES: 0 %
Lymphocytes Absolute: 1.4 10*3/uL (ref 0.7–3.1)
Lymphs: 26 %
MCH: 29.1 pg (ref 26.6–33.0)
MCHC: 33.7 g/dL (ref 31.5–35.7)
MCV: 86 fL (ref 79–97)
MONOCYTES: 5 %
Monocytes Absolute: 0.3 10*3/uL (ref 0.1–0.9)
Neutrophils Absolute: 3.5 10*3/uL (ref 1.4–7.0)
Neutrophils: 64 %
Platelets: 200 10*3/uL (ref 150–450)
RBC: 5.54 x10E6/uL (ref 4.14–5.80)
RDW: 13.9 % (ref 12.3–15.4)
WBC: 5.5 10*3/uL (ref 3.4–10.8)

## 2017-08-03 LAB — LIPID PANEL
Chol/HDL Ratio: 4.7 ratio (ref 0.0–5.0)
Cholesterol, Total: 196 mg/dL (ref 100–199)
HDL: 42 mg/dL (ref >39)
LDL Calculated: 124 mg/dL — ABNORMAL HIGH (ref 0–99)
TRIGLYCERIDES: 151 mg/dL — AB (ref 0–149)
VLDL CHOLESTEROL CAL: 30 mg/dL (ref 5–40)

## 2017-08-03 LAB — PSA: PROSTATE SPECIFIC AG, SERUM: 0.7 ng/mL (ref 0.0–4.0)

## 2017-08-03 LAB — TSH: TSH: 2.36 u[IU]/mL (ref 0.450–4.500)

## 2017-08-03 MED ORDER — ATORVASTATIN CALCIUM 20 MG PO TABS: 20.0000 mg | ORAL_TABLET | Freq: Every day | ORAL | 2 refills | Status: DC

## 2017-09-01 ENCOUNTER — Telehealth: Payer: Self-pay | Admitting: Family Medicine

## 2017-09-01 DIAGNOSIS — J309 Allergic rhinitis, unspecified: Secondary | ICD-10-CM

## 2017-09-01 NOTE — Telephone Encounter (Signed)
Copied from [place] [id number]. Topic: Inquiry >> Sep 01, 2017  8:39 AM Margot Ables wrote: Reason for CRM: pt stating he would like to come in next week for his annual allergy shot (last received 09/06/16). Please call pt to notify if ok to schedule nurse visit for injection.

## 2017-09-01 NOTE — Telephone Encounter (Signed)
Pt with diabetes.  I would like to wait for this when Dr. Jeananne Rama is back

## 2017-09-01 NOTE — Telephone Encounter (Signed)
Pt comes in yearly for allergy injection. Please order Triamcinolone Acetonide 40 mg if appropriate.

## 2017-09-03 NOTE — Telephone Encounter (Signed)
Call pt This is OK

## 2017-09-04 NOTE — Telephone Encounter (Signed)
Please place injection as future order so we can administer at nurse visit.

## 2017-09-06 ENCOUNTER — Ambulatory Visit (INDEPENDENT_AMBULATORY_CARE_PROVIDER_SITE_OTHER): Payer: BLUE CROSS/BLUE SHIELD

## 2017-09-06 DIAGNOSIS — J309 Allergic rhinitis, unspecified: Secondary | ICD-10-CM | POA: Diagnosis not present

## 2017-09-06 MED ORDER — TRIAMCINOLONE ACETONIDE 40 MG/ML IJ SUSP
40.0000 mg | Freq: Once | INTRAMUSCULAR | Status: AC
  Administered 2017-09-06: 40 mg via INTRAMUSCULAR

## 2017-09-06 MED ORDER — TRIAMCINOLONE ACETONIDE 10 MG/ML IJ SUSP: 40.0000 mg | Freq: Once | INTRAMUSCULAR | 0 refills | Status: DC

## 2017-09-06 NOTE — Telephone Encounter (Signed)
Please associate and sign order t'd up by Dr. Jeananne Rama, MD

## 2017-09-06 NOTE — Telephone Encounter (Signed)
Message relayed to patient. Verbalized understanding and denied questions.   

## 2017-09-06 NOTE — Telephone Encounter (Signed)
Order placed

## 2017-10-12 LAB — HM DIABETES EYE EXAM

## 2017-11-23 ENCOUNTER — Other Ambulatory Visit: Payer: Self-pay | Admitting: *Deleted

## 2017-11-23 DIAGNOSIS — J849 Interstitial pulmonary disease, unspecified: Secondary | ICD-10-CM

## 2017-11-23 NOTE — Addendum Note (Signed)
Addended by: Stephanie Coup on: 11/23/2017 04:08 PM   Modules accepted: Orders

## 2018-02-05 ENCOUNTER — Ambulatory Visit (INDEPENDENT_AMBULATORY_CARE_PROVIDER_SITE_OTHER): Payer: BLUE CROSS/BLUE SHIELD | Admitting: Family Medicine

## 2018-02-05 ENCOUNTER — Encounter: Payer: Self-pay | Admitting: Family Medicine

## 2018-02-05 VITALS — BP 136/89 | HR 94 | Temp 98.0°F | Ht 69.0 in | Wt 204.0 lb

## 2018-02-05 DIAGNOSIS — E118 Type 2 diabetes mellitus with unspecified complications: Secondary | ICD-10-CM

## 2018-02-05 DIAGNOSIS — E785 Hyperlipidemia, unspecified: Secondary | ICD-10-CM

## 2018-02-05 DIAGNOSIS — I10 Essential (primary) hypertension: Secondary | ICD-10-CM

## 2018-02-05 DIAGNOSIS — Z23 Encounter for immunization: Secondary | ICD-10-CM | POA: Diagnosis not present

## 2018-02-05 DIAGNOSIS — H9319 Tinnitus, unspecified ear: Secondary | ICD-10-CM | POA: Insufficient documentation

## 2018-02-05 DIAGNOSIS — H9313 Tinnitus, bilateral: Secondary | ICD-10-CM

## 2018-02-05 LAB — BAYER DCA HB A1C WAIVED: HB A1C (BAYER DCA - WAIVED): 7.1 % — ABNORMAL HIGH (ref <7.0)

## 2018-02-05 LAB — LP+ALT+AST PICCOLO, WAIVED
ALT (SGPT) Piccolo, Waived: 23 U/L (ref 10–47)
AST (SGOT) PICCOLO, WAIVED: 23 U/L (ref 11–38)
CHOL/HDL RATIO PICCOLO,WAIVE: 3.8 mg/dL
CHOLESTEROL PICCOLO, WAIVED: 218 mg/dL — AB (ref <200)
HDL CHOL PICCOLO, WAIVED: 57 mg/dL — AB (ref >59)
LDL CHOL CALC PICCOLO WAIVED: 131 mg/dL — AB (ref <100)
TRIGLYCERIDES PICCOLO,WAIVED: 150 mg/dL — AB (ref <150)
VLDL Chol Calc Piccolo,Waive: 30 mg/dL — ABNORMAL HIGH (ref <30)

## 2018-02-05 NOTE — Assessment & Plan Note (Signed)
Discussed diabetes need for better control will obtain with diet nutrition exercise

## 2018-02-05 NOTE — Assessment & Plan Note (Signed)
Discussed cholesterol and medications will start Lipitor 20 mg with directions to hold for 2 weeks if problems and then rechallenge and if problems patient will notify us.

## 2018-02-05 NOTE — Patient Instructions (Signed)
Influenza (Flu) Vaccine (Inactivated or Recombinant): What You Need to Know  1. Why get vaccinated?  Influenza vaccine can prevent influenza (flu).  Flu is a contagious disease that spreads around the [place] every year, usually between October and May. Anyone can get the flu, but it is more dangerous for some people. Infants and young children, people 62 years of age and older, pregnant women, and people with certain health conditions or a weakened immune system are at greatest risk of flu complications.  Pneumonia, bronchitis, sinus infections and ear infections are examples of flu-related complications. If you have a medical condition, such as heart disease, cancer or diabetes, flu can make it worse.  Flu can cause fever and chills, sore throat, muscle aches, fatigue, cough, headache, and runny or stuffy nose. Some people may have vomiting and diarrhea, though this is more common in children than adults.  Each year thousands of people in the [place] die from flu, and many more are hospitalized. Flu vaccine prevents millions of illnesses and flu-related visits to the doctor each year.  2. Influenza vaccine  CDC recommends everyone 6 months of age and older get vaccinated every flu season. Children 6 months through 8 years of age may need 2 doses during a single flu season. Everyone else needs only 1 dose each flu season.  It takes about 2 weeks for protection to develop after vaccination.  There are many flu viruses, and they are always changing. Each year a new flu vaccine is made to protect against three or four viruses that are likely to cause disease in the upcoming flu season. Even when the vaccine doesn't exactly match these viruses, it may still provide some protection.  Influenza vaccine does not cause flu.  Influenza vaccine may be given at the same time as other vaccines.  3. Talk with your health care provider  Tell your vaccine provider if the person getting the vaccine:  · Has had an  allergic reaction after a previous dose of influenza vaccine, or has any severe, life-threatening allergies.  · Has ever had Guillain-Barré Syndrome (also called GBS).  In some cases, your health care provider may decide to postpone influenza vaccination to a future visit.  People with minor illnesses, such as a cold, may be vaccinated. People who are moderately or severely ill should usually wait until they recover before getting influenza vaccine.  Your health care provider can give you more information.  4. Risks of a vaccine reaction  · Soreness, redness, and swelling where shot is given, fever, muscle aches, and headache can happen after influenza vaccine.  · There may be a very small increased risk of Guillain-Barré Syndrome (GBS) after inactivated influenza vaccine (the flu shot).  Young children who get the flu shot along with pneumococcal vaccine (PCV13), and/or DTaP vaccine at the same time might be slightly more likely to have a seizure caused by fever. Tell your health care provider if a child who is getting flu vaccine has ever had a seizure.  People sometimes faint after medical procedures, including vaccination. Tell your provider if you feel dizzy or have vision changes or ringing in the ears.  As with any medicine, there is a very remote chance of a vaccine causing a severe allergic reaction, other serious injury, or death.  5. What if there is a serious problem?  An allergic reaction could occur after the vaccinated person leaves the clinic. If you see signs of a severe allergic reaction (hives, swelling   of the face and throat, difficulty breathing, a fast heartbeat, dizziness, or weakness), call 9-1-1 and get the person to the nearest hospital.  For other signs that concern you, call your health care provider.  Adverse reactions should be reported to the Vaccine Adverse Event Reporting System (VAERS). Your health care provider will usually file this report, or you can do it yourself. Visit the  VAERS website at www.vaers.hhs.gov or call 1-800-822-7967.VAERS is only for reporting reactions, and VAERS staff do not give medical advice.  6. The National Vaccine Injury Compensation Program  The National Vaccine Injury Compensation Program (VICP) is a federal program that was created to compensate people who may have been injured by certain vaccines. Visit the VICP website at www.hrsa.gov/vaccinecompensation or call 1-800-338-2382 to learn about the program and about filing a claim. There is a time limit to file a claim for compensation.  7. How can I learn more?  · Ask your healthcare provider.  · Call your local or state health department.  · Contact the Centers for Disease Control and Prevention (CDC):  ? Call 1-800-232-4636 (1-800-CDC-INFO) or  ? Visit CDC's www.cdc.gov/flu  Vaccine Information Statement (Interim) Inactivated Influenza Vaccine (08/31/2017)  This information is not intended to replace advice given to you by your health care provider. Make sure you discuss any questions you have with your health care provider.  Document Released: 10/28/2005 Document Revised: 09/04/2017 Document Reviewed: 09/04/2017  Elsevier Interactive Patient Education © 2019 [employer] Inc.

## 2018-02-05 NOTE — Assessment & Plan Note (Signed)
The current medical regimen is effective;  continue present plan and medications.  

## 2018-02-05 NOTE — Progress Notes (Signed)
BP 136/89   Pulse 94   Temp 98 F (36.7 C) (Oral)   Ht 5\' 9"  (1.753 m)   Wt 204 lb (92.5 kg)   BMI 30.13 kg/m    Subjective:    Patient ID: Philip Dunn., male    DOB: 29-Dec-1955, 63 y.o.   MRN: [id number]  HPI: Philip Ocain. is a 63 y.o. male  Chief Complaint  Patient presents with  . Follow-up    Patient is only taking one cholesterol medication. Does not know which one.   . Hypertension  . Hyperlipidemia  . Diabetes  . Tinnitus    Bilaterally. Started a few weeks ago  . Health Maintenance    Needs Flu (given) A1C (ordered), Monofilament (placed on table)  Discussed with patient blood pressure doing well no complaints taking blood pressure medication without problems. Discussed diabetes had some problems with diet control taking medications without problems. Reviewed cholesterol patient's been reluctant to start cholesterol medications due to reputation of cholesterol meds.  Reviewed risk benefit and discussed ways to mitigate risk.  Patient will start atorvastatin 20 mg. Discussed tinnitus will observe use hearing protection  Relevant past medical, surgical, family and social history reviewed and updated as indicated. Interim medical history since our last visit reviewed. Allergies and medications reviewed and updated.  Review of Systems  Constitutional: Negative.   Respiratory: Negative.   Cardiovascular: Negative.      Per HPI unless specifically indicated above     Objective:    BP 136/89   Pulse 94   Temp 98 F (36.7 C) (Oral)   Ht 5\' 9"  (1.753 m)   Wt 204 lb (92.5 kg)   BMI 30.13 kg/m   Wt Readings from Last 3 Encounters:  02/05/18 204 lb (92.5 kg)  08/02/17 202 lb (91.6 kg)  02/06/17 210 lb (95.3 kg)    Physical Exam Constitutional:      Appearance: He is well-developed.  HENT:     Head: Normocephalic and atraumatic.  Eyes:     Conjunctiva/sclera: Conjunctivae normal.  Neck:     Musculoskeletal: Normal range of motion.    Cardiovascular:     Rate and Rhythm: Normal rate and regular rhythm.     Heart sounds: Normal heart sounds.  Pulmonary:     Effort: Pulmonary effort is normal.     Breath sounds: Normal breath sounds.  Musculoskeletal: Normal range of motion.  Skin:    Findings: No erythema.  Neurological:     Mental Status: He is alert and oriented to person, place, and time.  Psychiatric:        Behavior: Behavior normal.        Thought Content: Thought content normal.        Judgment: Judgment normal.     Results for orders placed or performed in visit on 10/16/17  HM DIABETES EYE EXAM  Result Value Ref Range   HM Diabetic Eye Exam No Retinopathy No Retinopathy      Assessment & Plan:   Problem List Items Addressed This Visit      Cardiovascular and Mediastinum   Hypertension    The current medical regimen is effective;  continue present plan and medications.       Relevant Orders   Basic metabolic panel   LP+ALT+AST Piccolo, Waived   Bayer DCA Hb A1c Waived     Other   Hyperlipidemia    Discussed cholesterol and medications will start Lipitor 20 mg with directions  to hold for 2 weeks if problems and then rechallenge and if problems patient will notify us.      Relevant Orders   Basic metabolic panel   LP+ALT+AST Piccolo, Waived   Bayer DCA Hb A1c Waived   Tinnitus    Discussed tinnitus care and treatment hearing protection       Other Visit Diagnoses    Type 2 diabetes mellitus with complication, without long-term current use of insulin ([place])    -  Primary   Relevant Orders   Basic metabolic panel   LP+ALT+AST Piccolo, Waived   Bayer DCA Hb A1c Waived   Needs flu shot       Relevant Orders   Flu Vaccine QUAD 6+ mos PF IM (Fluarix Quad PF) (Completed)       Follow up plan: Return in about 6 months (around 08/06/2018) for Hemoglobin A1c.

## 2018-02-05 NOTE — Assessment & Plan Note (Signed)
Discussed tinnitus care and treatment hearing protection

## 2018-02-06 ENCOUNTER — Encounter: Payer: Self-pay | Admitting: Family Medicine

## 2018-02-06 LAB — BASIC METABOLIC PANEL
BUN/Creatinine Ratio: 17 (ref 10–24)
BUN: 13 mg/dL (ref 8–27)
CO2: 23 mmol/L (ref 20–29)
CREATININE: 0.76 mg/dL (ref 0.76–1.27)
Calcium: 9.7 mg/dL (ref 8.6–10.2)
Chloride: 103 mmol/L (ref 96–106)
GFR, EST AFRICAN AMERICAN: 114 mL/min/{1.73_m2} (ref >59)
GFR, EST NON AFRICAN AMERICAN: 98 mL/min/{1.73_m2} (ref >59)
Glucose: 151 mg/dL — ABNORMAL HIGH (ref 65–99)
POTASSIUM: 4.5 mmol/L (ref 3.5–5.2)
Sodium: 148 mmol/L — ABNORMAL HIGH (ref 134–144)

## 2018-07-18 ENCOUNTER — Other Ambulatory Visit: Payer: Self-pay | Admitting: Family Medicine

## 2018-07-18 DIAGNOSIS — I10 Essential (primary) hypertension: Secondary | ICD-10-CM

## 2018-08-07 ENCOUNTER — Encounter: Payer: Self-pay | Admitting: Family Medicine

## 2018-08-07 ENCOUNTER — Other Ambulatory Visit: Payer: Self-pay

## 2018-08-07 ENCOUNTER — Ambulatory Visit (INDEPENDENT_AMBULATORY_CARE_PROVIDER_SITE_OTHER): Payer: BLUE CROSS/BLUE SHIELD | Admitting: Family Medicine

## 2018-08-07 VITALS — Wt 201.0 lb

## 2018-08-07 DIAGNOSIS — I10 Essential (primary) hypertension: Secondary | ICD-10-CM

## 2018-08-07 DIAGNOSIS — E119 Type 2 diabetes mellitus without complications: Secondary | ICD-10-CM

## 2018-08-07 DIAGNOSIS — E1169 Type 2 diabetes mellitus with other specified complication: Secondary | ICD-10-CM

## 2018-08-07 DIAGNOSIS — E785 Hyperlipidemia, unspecified: Secondary | ICD-10-CM | POA: Diagnosis not present

## 2018-08-07 DIAGNOSIS — Z0000 Encounter for general adult medical examination without abnormal findings: Secondary | ICD-10-CM

## 2018-08-07 MED ORDER — TELMISARTAN 80 MG PO TABS: 80.0000 mg | ORAL_TABLET | Freq: Every day | ORAL | 4 refills | Status: DC

## 2018-08-07 MED ORDER — AMLODIPINE BESYLATE 10 MG PO TABS: 10.0000 mg | ORAL_TABLET | Freq: Every day | ORAL | 4 refills | Status: DC

## 2018-08-07 MED ORDER — XIGDUO XR 5-1000 MG PO TB24: 1.0000 | ORAL_TABLET | Freq: Two times a day (BID) | ORAL | 4 refills | Status: DC

## 2018-08-07 NOTE — Assessment & Plan Note (Signed)
Patient did not start atorvastatin reviewed again the importance of cholesterol medication and diabetes.  Patient wants to wait until his lab work is done which will be done 1 day prior to office visit for hands-on physical.  This is to be reviewed at that time and reconsideration for statin.  Reviewed statin side effects and management of side effects.

## 2018-08-07 NOTE — Assessment & Plan Note (Signed)
Apparent good control will continue current medications

## 2018-08-07 NOTE — Progress Notes (Signed)
Wt 201 lb (91.2 kg)   BMI 29.68 kg/m    Subjective:    Patient ID: Philip Dunn., male    DOB: Aug 24, 1955, 63 y.o.   MRN: [id number]  HPI: Philip Agustin. is a 63 y.o. male  Med check Discussed with patient cholesterol cholesterol medication which patient is not taking reviewed side effects side effect management. Hypertension doing well no complaints diabetes doing well no complaints no low blood sugar spells not doing CPAP treatment as has lost weight.   Relevant past medical, surgical, family and social history reviewed and updated as indicated. Interim medical history since our last visit reviewed. Allergies and medications reviewed and updated.  Review of Systems  Constitutional: Negative.   HENT: Negative.   Eyes: Negative.   Respiratory: Negative.   Cardiovascular: Negative.   Gastrointestinal: Negative.   Endocrine: Negative.   Genitourinary: Negative.   Musculoskeletal: Negative.   Skin: Negative.   Allergic/Immunologic: Negative.   Neurological: Negative.   Hematological: Negative.   Psychiatric/Behavioral: Negative.     Per HPI unless specifically indicated above     Objective:    Wt 201 lb (91.2 kg)   BMI 29.68 kg/m   Wt Readings from Last 3 Encounters:  08/07/18 201 lb (91.2 kg)  02/05/18 204 lb (92.5 kg)  08/02/17 202 lb (91.6 kg)    Physical Exam  Results for orders placed or performed in visit on 40/34/74  Basic metabolic panel  Result Value Ref Range   Glucose 151 (H) 65 - 99 mg/dL   BUN 13 8 - 27 mg/dL   Creatinine, Ser 0.76 0.76 - 1.27 mg/dL   GFR calc non Af Amer 98 >59 mL/min/1.73   GFR calc Af Amer 114 >59 mL/min/1.73   BUN/Creatinine Ratio 17 10 - 24   Sodium 148 (H) 134 - 144 mmol/L   Potassium 4.5 3.5 - 5.2 mmol/L   Chloride 103 96 - 106 mmol/L   CO2 23 20 - 29 mmol/L   Calcium 9.7 8.6 - 10.2 mg/dL  LP+ALT+AST Piccolo, Waived  Result Value Ref Range   ALT (SGPT) Piccolo, Waived 23 10 - 47 U/L   AST (SGOT)  Piccolo, Waived 23 11 - 38 U/L   Cholesterol Piccolo, Waived 218 (H) <200 mg/dL   HDL Chol Piccolo, Waived 57 (L) >59 mg/dL   Triglycerides Piccolo,Waived 150 (H) <150 mg/dL   Chol/HDL Ratio Piccolo,Waive 3.8 mg/dL   LDL Chol Calc Piccolo Waived 131 (H) <100 mg/dL   VLDL Chol Calc Piccolo,Waive 30 (H) <30 mg/dL  Bayer DCA Hb A1c Waived  Result Value Ref Range   HB A1C (BAYER DCA - WAIVED) 7.1 (H) <7.0 %      Assessment & Plan:   Problem List Items Addressed This Visit      Cardiovascular and Mediastinum   Hypertension    The current medical regimen is effective;  continue present plan and medications.       Relevant Medications   telmisartan (MICARDIS) 80 MG tablet   amLODipine (NORVASC) 10 MG tablet     Endocrine   Diabetes mellitus associated with hormonal etiology ([place])    Apparent good control will continue current medications      Relevant Medications   telmisartan (MICARDIS) 80 MG tablet   Dapagliflozin-metFORMIN HCl ER (XIGDUO XR) 05-998 MG TB24   Other Relevant Orders   Bayer DCA Hb A1c Waived     Other   Hyperlipidemia    Patient did not start  atorvastatin reviewed again the importance of cholesterol medication and diabetes.  Patient wants to wait until his lab work is done which will be done 1 day prior to office visit for hands-on physical.  This is to be reviewed at that time and reconsideration for statin.  Reviewed statin side effects and management of side effects.      Relevant Medications   telmisartan (MICARDIS) 80 MG tablet   amLODipine (NORVASC) 10 MG tablet    Other Visit Diagnoses    PE (physical exam), annual    -  Primary   Relevant Orders   Comprehensive metabolic panel   Lipid panel   CBC with Differential/Platelet   TSH   Urinalysis, Routine w reflex microscopic   PSA       Telemedicine using audio/video telecommunications for a synchronous communication visit. Today's visit due to COVID-19 isolation precautions I connected with  and verified that I am speaking with the correct person using two identifiers.   I discussed the limitations, risks, security and privacy concerns of performing an evaluation and management service by telecommunication and the availability of in person appointments. I also discussed with the patient that there may be a patient responsible charge related to this service. The patient expressed understanding and agreed to proceed. The patient's location is home. I am at home.   I discussed the assessment and treatment plan with the patient. The patient was provided an opportunity to ask questions and all were answered. The patient agreed with the plan and demonstrated an understanding of the instructions.   The patient was advised to call back or seek an in-person evaluation if the symptoms worsen or if the condition fails to improve as anticipated.   I provided 21+ minutes of time during this encounter. Follow up plan: Return in about 6 months (around 02/07/2019) for physical exam in September and then six-month follow-up for hemoglobin A1c, BMP,  Lipids, ALT, AST.

## 2018-08-07 NOTE — Assessment & Plan Note (Signed)
The current medical regimen is effective;  continue present plan and medications.  

## 2018-09-13 ENCOUNTER — Telehealth: Payer: Self-pay | Admitting: Family Medicine

## 2018-09-13 NOTE — Telephone Encounter (Signed)
Pt states that he needs to get an allergy shot.  States that he gets one every year and would like to have on Monday 09/17/2018.

## 2018-09-13 NOTE — Telephone Encounter (Signed)
I do not, I've not seen him before. Will route to PCP for advice

## 2018-09-13 NOTE — Telephone Encounter (Signed)
Routing to provider. Do you know anything about this? He has an appointment with you 09/20/18.

## 2018-09-15 ENCOUNTER — Other Ambulatory Visit: Payer: Self-pay | Admitting: Family Medicine

## 2018-09-15 DIAGNOSIS — T7840XD Allergy, unspecified, subsequent encounter: Secondary | ICD-10-CM

## 2018-09-15 MED ORDER — TRIAMCINOLONE ACETONIDE 40 MG/ML IJ SUSP
40.0000 mg | Freq: Once | INTRAMUSCULAR | Status: AC
  Administered 2018-09-20: 40 mg via INTRAMUSCULAR

## 2018-09-15 NOTE — Telephone Encounter (Signed)
Pt gets Triamcinolone Acetonide 40 mg One  Time a year for allergies prob ragweed ordered

## 2018-09-17 NOTE — Telephone Encounter (Signed)
Called and left a message for patient letting him know that the order is in and that he can get it done at his visit scheduled with Apolonio Schneiders.

## 2018-09-20 ENCOUNTER — Other Ambulatory Visit: Payer: Self-pay

## 2018-09-20 ENCOUNTER — Ambulatory Visit (INDEPENDENT_AMBULATORY_CARE_PROVIDER_SITE_OTHER): Payer: BLUE CROSS/BLUE SHIELD | Admitting: Family Medicine

## 2018-09-20 ENCOUNTER — Encounter: Payer: Self-pay | Admitting: Family Medicine

## 2018-09-20 VITALS — BP 122/81 | HR 105 | Temp 98.8°F | Ht 68.5 in | Wt 203.0 lb

## 2018-09-20 DIAGNOSIS — Z0000 Encounter for general adult medical examination without abnormal findings: Secondary | ICD-10-CM

## 2018-09-20 DIAGNOSIS — Z23 Encounter for immunization: Secondary | ICD-10-CM | POA: Diagnosis not present

## 2018-09-20 DIAGNOSIS — E1169 Type 2 diabetes mellitus with other specified complication: Secondary | ICD-10-CM

## 2018-09-20 DIAGNOSIS — T7840XD Allergy, unspecified, subsequent encounter: Secondary | ICD-10-CM

## 2018-09-20 LAB — URINALYSIS, ROUTINE W REFLEX MICROSCOPIC
Bilirubin, UA: NEGATIVE
Leukocytes,UA: NEGATIVE
Nitrite, UA: NEGATIVE
Protein,UA: NEGATIVE
RBC, UA: NEGATIVE
Specific Gravity, UA: 1.015 (ref 1.005–1.030)
Urobilinogen, Ur: 0.2 mg/dL (ref 0.2–1.0)
pH, UA: 5 (ref 5.0–7.5)

## 2018-09-20 LAB — BAYER DCA HB A1C WAIVED: HB A1C (BAYER DCA - WAIVED): 7.1 % — ABNORMAL HIGH (ref <7.0)

## 2018-09-20 NOTE — Progress Notes (Signed)
BP 122/81   Pulse (!) 105   Temp 98.8 F (37.1 C) (Oral)   Ht 5' 8.5" (1.74 m)   Wt 203 lb (92.1 kg)   SpO2 95%   BMI 30.42 kg/m    Subjective:    Patient ID: Philip Dunn., male    DOB: 1955/12/07, 63 y.o.   MRN: [id number]  HPI: Philip Bolitho. is a 63 y.o. male presenting on 09/20/2018 for comprehensive medical examination. Current medical complaints include:none  He currently lives with: Interim Problems from his last visit: no  Depression Screen done today and results listed below:  Depression screen [facility] 2/9 09/20/2018 08/02/2017 02/06/2017 10/12/2016 10/12/2016  Decreased Interest 0 0 0 0 0  Down, Depressed, Hopeless 0 0 0 0 0  PHQ - 2 Score 0 0 0 0 0  Altered sleeping 0 - - - -  Tired, decreased energy 0 - - - -  Change in appetite 0 - - - -  Feeling bad or failure about yourself  0 - - - -  Trouble concentrating 1 - - - -  Moving slowly or fidgety/restless 0 - - - -  Suicidal thoughts 0 - - - -  PHQ-9 Score 1 - - - -    The patient does not have a history of falls. I did complete a risk assessment for falls. A plan of care for falls was documented.   Past Medical History:  Past Medical History:  Diagnosis Date  . Allergy   . Diabetes mellitus without complication ([place])   . Hyperlipidemia   . Hypertension   . Sleep apnea    no CPAP    Surgical History:  Past Surgical History:  Procedure Laterality Date  . COLONOSCOPY    . KNEE ARTHROSCOPY Right   . SHOULDER ARTHROSCOPY WITH BICEPS TENDON REPAIR Left 08/29/2016   Procedure: SHOULDER ARTHROSCOPY WITH BICEPS TENDON REPAIR;  Surgeon: Thornton Park, MD;  Location: [place];  Service: Orthopedics;  Laterality: Left;  . SHOULDER ARTHROSCOPY WITH OPEN ROTATOR CUFF REPAIR AND DISTAL CLAVICLE ACROMINECTOMY Left 08/29/2016   Procedure: SHOULDER ARTHROSCOPY WITH OPEN ROTATOR CUFF REPAIR AND DISTAL CLAVICLE ACROMINECTOMY;  Surgeon: Thornton Park, MD;  Location: [place];  Service:  Orthopedics;  Laterality: Left;  Sleep Apnea - No CPAP    Medications:  Current Outpatient Medications on File Prior to Visit  Medication Sig  . amLODipine (NORVASC) 10 MG tablet Take 1 tablet (10 mg total) by mouth daily.  Marland Kitchen aspirin EC 81 MG tablet Take 81 mg by mouth daily.  . Dapagliflozin-metFORMIN HCl ER (XIGDUO XR) 05-998 MG TB24 Take 1 tablet by mouth 2 (two) times daily.  . Multiple Vitamin (MULTIVITAMIN) tablet Take 1 tablet by mouth daily.  Marland Kitchen telmisartan (MICARDIS) 80 MG tablet Take 1 tablet (80 mg total) by mouth daily.   No current facility-administered medications on file prior to visit.     Allergies:  No Known Allergies  Social History:  Social History   Socioeconomic History  . Marital status: Married    Spouse name: Not on file  . Number of children: Not on file  . Years of education: Not on file  . Highest education level: Not on file  Occupational History  . Not on file  Social Needs  . Financial resource strain: Not on file  . Food insecurity    Worry: Not on file    Inability: Not on file  . Transportation needs  Medical: Not on file    Non-medical: Not on file  Tobacco Use  . Smoking status: Former Smoker    Types: Cigarettes    Quit date: 07/28/1974    Years since quitting: 44.1  . Smokeless tobacco: Never Used  Substance and Sexual Activity  . Alcohol use: Yes    Alcohol/week: 2.0 standard drinks    Types: 2 Cans of beer per week    Comment: bi weekly use per pt.  . Drug use: No  . Sexual activity: Not on file  Lifestyle  . Physical activity    Days per week: Not on file    Minutes per session: Not on file  . Stress: Not on file  Relationships  . Social Herbalist on phone: Not on file    Gets together: Not on file    Attends religious service: Not on file    Active member of club or organization: Not on file    Attends meetings of clubs or organizations: Not on file    Relationship status: Not on file  . Intimate  partner violence    Fear of current or ex partner: Not on file    Emotionally abused: Not on file    Physically abused: Not on file    Forced sexual activity: Not on file  Other Topics Concern  . Not on file  Social History Narrative  . Not on file   Social History   Tobacco Use  Smoking Status Former Smoker  . Types: Cigarettes  . Quit date: 07/28/1974  . Years since quitting: 44.1  Smokeless Tobacco Never Used   Social History   Substance and Sexual Activity  Alcohol Use Yes  . Alcohol/week: 2.0 standard drinks  . Types: 2 Cans of beer per week   Comment: bi weekly use per pt.    Family History:  Family History  Problem Relation Age of Onset  . Diabetes Mother   . CAD Maternal Grandfather   . Heart attack Maternal Grandfather   . Cancer Maternal Grandfather   . Colon cancer Neg Hx     Past medical history, surgical history, medications, allergies, family history and social history reviewed with patient today and changes made to appropriate areas of the chart.   Review of Systems - General ROS: negative Psychological ROS: negative Ophthalmic ROS: negative ENT ROS: negative Allergy and Immunology ROS: negative Hematological and Lymphatic ROS: negative Endocrine ROS: negative Respiratory ROS: no cough, shortness of breath, or wheezing Cardiovascular ROS: no chest pain or dyspnea on exertion Gastrointestinal ROS: no abdominal pain, change in bowel habits, or black or bloody stools Genito-Urinary ROS: no dysuria, trouble voiding, or hematuria Musculoskeletal ROS: negative Neurological ROS: no TIA or stroke symptoms Dermatological ROS: negative All other ROS negative except what is listed above and in the HPI.      Objective:    BP 122/81   Pulse (!) 105   Temp 98.8 F (37.1 C) (Oral)   Ht 5' 8.5" (1.74 m)   Wt 203 lb (92.1 kg)   SpO2 95%   BMI 30.42 kg/m   Wt Readings from Last 3 Encounters:  09/20/18 203 lb (92.1 kg)  08/07/18 201 lb (91.2 kg)   02/05/18 204 lb (92.5 kg)    Physical Exam Vitals signs and nursing note reviewed.  Constitutional:      General: He is not in acute distress.    Appearance: He is well-developed.  HENT:     Head:  Atraumatic.     Right Ear: Tympanic membrane and external ear normal.     Left Ear: Tympanic membrane and external ear normal.     Nose: Nose normal.     Mouth/Throat:     Mouth: Mucous membranes are moist.     Pharynx: Oropharynx is clear.  Eyes:     General: No scleral icterus.    Conjunctiva/sclera: Conjunctivae normal.     Pupils: Pupils are equal, round, and reactive to light.  Neck:     Musculoskeletal: Normal range of motion and neck supple.  Cardiovascular:     Rate and Rhythm: Normal rate and regular rhythm.     Heart sounds: Normal heart sounds. No murmur.  Pulmonary:     Effort: Pulmonary effort is normal. No respiratory distress.     Breath sounds: Normal breath sounds.  Abdominal:     General: Bowel sounds are normal. There is no distension.     Palpations: Abdomen is soft. There is no mass.     Tenderness: There is no abdominal tenderness. There is no guarding.  Genitourinary:    Comments: Prostate mildly enlarged, age appropriate Musculoskeletal: Normal range of motion.        General: No tenderness.  Skin:    General: Skin is warm and dry.     Findings: No rash.  Neurological:     General: No focal deficit present.     Mental Status: He is alert and oriented to person, place, and time.     Deep Tendon Reflexes: Reflexes are normal and symmetric.  Psychiatric:        Mood and Affect: Mood normal.        Behavior: Behavior normal.        Thought Content: Thought content normal.        Judgment: Judgment normal.    Diabetic Foot Exam - Simple   Simple Foot Form Diabetic Foot exam was performed with the following findings: Yes 09/20/2018 12:35 PM  Visual Inspection No deformities, no ulcerations, no other skin breakdown bilaterally: Yes Sensation Testing  Intact to touch and monofilament testing bilaterally: Yes Pulse Check Posterior Tibialis and Dorsalis pulse intact bilaterally: Yes Comments     Results for orders placed or performed in visit on [id number]  Basic metabolic panel  Result Value Ref Range   Glucose 151 (H) 65 - 99 mg/dL   BUN 13 8 - 27 mg/dL   Creatinine, Ser 0.76 0.76 - 1.27 mg/dL   GFR calc non Af Amer 98 >59 mL/min/1.73   GFR calc Af Amer 114 >59 mL/min/1.73   BUN/Creatinine Ratio 17 10 - 24   Sodium 148 (H) 134 - 144 mmol/L   Potassium 4.5 3.5 - 5.2 mmol/L   Chloride 103 96 - 106 mmol/L   CO2 23 20 - 29 mmol/L   Calcium 9.7 8.6 - 10.2 mg/dL  LP+ALT+AST Piccolo, Waived  Result Value Ref Range   ALT (SGPT) Piccolo, Waived 23 10 - 47 U/L   AST (SGOT) Piccolo, Waived 23 11 - 38 U/L   Cholesterol Piccolo, Waived 218 (H) <200 mg/dL   HDL Chol Piccolo, Waived 57 (L) >59 mg/dL   Triglycerides Piccolo,Waived 150 (H) <150 mg/dL   Chol/HDL Ratio Piccolo,Waive 3.8 mg/dL   LDL Chol Calc Piccolo Waived 131 (H) <100 mg/dL   VLDL Chol Calc Piccolo,Waive 30 (H) <30 mg/dL  Bayer DCA Hb A1c Waived  Result Value Ref Range   HB A1C (BAYER DCA - WAIVED) 7.1 (H) <  7.0 %      Assessment & Plan:   Problem List Items Addressed This Visit      Endocrine   Diabetes mellitus associated with hormonal etiology ([place])    Other Visit Diagnoses    Annual physical exam    -  Primary   Flu vaccine need       Relevant Orders   Flu Vaccine QUAD 36+ mos IM (Completed)   PE (physical exam), annual           Discussed aspirin prophylaxis for myocardial infarction prevention and decision was made to continue ASA  LABORATORY TESTING:  Health maintenance labs ordered today as discussed above.   The natural history of prostate cancer and ongoing controversy regarding screening and potential treatment outcomes of prostate cancer has been discussed with the patient. The meaning of a false positive PSA and a false negative PSA has been  discussed. He indicates understanding of the limitations of this screening test and wishes to proceed with screening PSA testing.   IMMUNIZATIONS:   - Tdap: Tetanus vaccination status reviewed: last tetanus booster within 10 years. - Influenza: Administered today - Pneumovax: Not applicable - Prevnar: Not applicable - HPV: Not applicable - Zostavax vaccine: Refused  SCREENING: - Colonoscopy: Up to date  Discussed with patient purpose of the colonoscopy is to detect colon cancer at curable precancerous or early stages   PATIENT COUNSELING:    Sexuality: Discussed sexually transmitted diseases, partner selection, use of condoms, avoidance of unintended pregnancy  and contraceptive alternatives.   Advised to avoid cigarette smoking.  I discussed with the patient that most people either abstain from alcohol or drink within safe limits (<=14/week and <=4 drinks/occasion for males, <=7/weeks and <= 3 drinks/occasion for females) and that the risk for alcohol disorders and other health effects rises proportionally with the number of drinks per week and how often a drinker exceeds daily limits.  Discussed cessation/primary prevention of drug use and availability of treatment for abuse.   Diet: Encouraged to adjust caloric intake to maintain  or achieve ideal body weight, to reduce intake of dietary saturated fat and total fat, to limit sodium intake by avoiding high sodium foods and not adding table salt, and to maintain adequate dietary potassium and calcium preferably from fresh fruits, vegetables, and low-fat dairy products.    stressed the importance of regular exercise  Injury prevention: Discussed safety belts, safety helmets, smoke detector, smoking near bedding or upholstery.   Dental health: Discussed importance of regular tooth brushing, flossing, and dental visits.   Follow up plan: NEXT PREVENTATIVE PHYSICAL DUE IN 1 YEAR. Return in about 6 months (around 03/20/2019) for 6 month  f/u.

## 2018-09-21 LAB — COMPREHENSIVE METABOLIC PANEL
ALT: 23 IU/L (ref 0–44)
AST: 22 IU/L (ref 0–40)
Albumin/Globulin Ratio: 1.8 (ref 1.2–2.2)
Albumin: 4.6 g/dL (ref 3.8–4.8)
Alkaline Phosphatase: 61 IU/L (ref 39–117)
BUN/Creatinine Ratio: 14 (ref 10–24)
BUN: 12 mg/dL (ref 8–27)
Bilirubin Total: 0.7 mg/dL (ref 0.0–1.2)
CO2: 21 mmol/L (ref 20–29)
Calcium: 9.4 mg/dL (ref 8.6–10.2)
Chloride: 104 mmol/L (ref 96–106)
Creatinine, Ser: 0.84 mg/dL (ref 0.76–1.27)
GFR calc Af Amer: 108 mL/min/{1.73_m2} (ref >59)
GFR calc non Af Amer: 94 mL/min/{1.73_m2} (ref >59)
Globulin, Total: 2.5 g/dL (ref 1.5–4.5)
Glucose: 148 mg/dL — ABNORMAL HIGH (ref 65–99)
Potassium: 4.2 mmol/L (ref 3.5–5.2)
Sodium: 140 mmol/L (ref 134–144)
Total Protein: 7.1 g/dL (ref 6.0–8.5)

## 2018-09-21 LAB — LIPID PANEL
Chol/HDL Ratio: 4.4 ratio (ref 0.0–5.0)
Cholesterol, Total: 196 mg/dL (ref 100–199)
HDL: 45 mg/dL (ref >39)
LDL Chol Calc (NIH): 120 mg/dL — ABNORMAL HIGH (ref 0–99)
Triglycerides: 177 mg/dL — ABNORMAL HIGH (ref 0–149)
VLDL Cholesterol Cal: 31 mg/dL (ref 5–40)

## 2018-09-21 LAB — CBC WITH DIFFERENTIAL/PLATELET
Basophils Absolute: 0.1 10*3/uL (ref 0.0–0.2)
Basos: 1 %
EOS (ABSOLUTE): 0.3 10*3/uL (ref 0.0–0.4)
Eos: 5 %
Hematocrit: 49.1 % (ref 37.5–51.0)
Hemoglobin: 15.9 g/dL (ref 13.0–17.7)
Immature Grans (Abs): 0 10*3/uL (ref 0.0–0.1)
Immature Granulocytes: 0 %
Lymphocytes Absolute: 1.3 10*3/uL (ref 0.7–3.1)
Lymphs: 26 %
MCH: 29.1 pg (ref 26.6–33.0)
MCHC: 32.4 g/dL (ref 31.5–35.7)
MCV: 90 fL (ref 79–97)
Monocytes Absolute: 0.3 10*3/uL (ref 0.1–0.9)
Monocytes: 6 %
Neutrophils Absolute: 3.1 10*3/uL (ref 1.4–7.0)
Neutrophils: 62 %
Platelets: 194 10*3/uL (ref 150–450)
RBC: 5.47 x10E6/uL (ref 4.14–5.80)
RDW: 13.4 % (ref 11.6–15.4)
WBC: 5.1 10*3/uL (ref 3.4–10.8)

## 2018-09-21 LAB — PSA: Prostate Specific Ag, Serum: 0.6 ng/mL (ref 0.0–4.0)

## 2018-09-21 LAB — TSH: TSH: 3.09 u[IU]/mL (ref 0.450–4.500)

## 2018-11-27 LAB — HM DIABETES EYE EXAM

## 2019-02-12 ENCOUNTER — Ambulatory Visit: Payer: Self-pay | Admitting: Family Medicine

## 2019-03-26 ENCOUNTER — Ambulatory Visit: Payer: Self-pay | Admitting: Family Medicine

## 2019-03-27 ENCOUNTER — Other Ambulatory Visit: Payer: Self-pay

## 2019-03-27 ENCOUNTER — Encounter: Payer: Self-pay | Admitting: Family Medicine

## 2019-03-27 ENCOUNTER — Ambulatory Visit (INDEPENDENT_AMBULATORY_CARE_PROVIDER_SITE_OTHER): Payer: PRIVATE HEALTH INSURANCE | Admitting: Family Medicine

## 2019-03-27 VITALS — BP 126/80 | HR 97 | Temp 98.4°F | Ht 68.5 in | Wt 205.0 lb

## 2019-03-27 DIAGNOSIS — E785 Hyperlipidemia, unspecified: Secondary | ICD-10-CM | POA: Diagnosis not present

## 2019-03-27 DIAGNOSIS — E1169 Type 2 diabetes mellitus with other specified complication: Secondary | ICD-10-CM

## 2019-03-27 DIAGNOSIS — I10 Essential (primary) hypertension: Secondary | ICD-10-CM

## 2019-03-27 DIAGNOSIS — G4730 Sleep apnea, unspecified: Secondary | ICD-10-CM

## 2019-03-27 NOTE — Progress Notes (Signed)
BP 126/80   Pulse 97   Temp 98.4 F (36.9 C) (Oral)   Ht 5' 8.5" (1.74 m)   Wt 205 lb (93 kg)   SpO2 97%   BMI 30.72 kg/m    Subjective:    Patient ID: Philip Ryan., male    DOB: 03/11/1955, 64 y.o.   MRN: [id number]  HPI: Gregeory Ryan. is a 64 y.o. male  Chief Complaint  Patient presents with  . Hypertension   Here today for 6 month f/u chronic conditions.   HTN - Does not check BPs at home, but taking medications faithfully without side effects. Denies CP, SOB, HAs, dizziness. Trying to eat well, stays active at work.   DM - does not check home sugars. Trying to eat well, takes xigduo faithfully without side effects. No low blood sugar spells.   HLD - currently diet controlled. Denies claudication, CP, SOB.   Relevant past medical, surgical, family and social history reviewed and updated as indicated. Interim medical history since our last visit reviewed. Allergies and medications reviewed and updated.  Review of Systems  Per HPI unless specifically indicated above     Objective:    BP 126/80   Pulse 97   Temp 98.4 F (36.9 C) (Oral)   Ht 5' 8.5" (1.74 m)   Wt 205 lb (93 kg)   SpO2 97%   BMI 30.72 kg/m   Wt Readings from Last 3 Encounters:  03/27/19 205 lb (93 kg)  09/20/18 203 lb (92.1 kg)  08/07/18 201 lb (91.2 kg)    Physical Exam Vitals and nursing note reviewed.  Constitutional:      Appearance: Normal appearance.  HENT:     Head: Atraumatic.  Eyes:     Extraocular Movements: Extraocular movements intact.     Conjunctiva/sclera: Conjunctivae normal.  Cardiovascular:     Rate and Rhythm: Normal rate and regular rhythm.  Pulmonary:     Effort: Pulmonary effort is normal.     Breath sounds: Normal breath sounds.  Abdominal:     General: Bowel sounds are normal.     Palpations: Abdomen is soft.     Tenderness: There is no abdominal tenderness. There is no right CVA tenderness or left CVA tenderness.  Musculoskeletal:      General: Normal range of motion.     Cervical back: Normal range of motion and neck supple.  Skin:    General: Skin is warm and dry.  Neurological:     General: No focal deficit present.     Mental Status: He is oriented to person, place, and time.  Psychiatric:        Mood and Affect: Mood normal.        Thought Content: Thought content normal.        Judgment: Judgment normal.    Results for orders placed or performed in visit on 12/26/18  HM DIABETES EYE EXAM  Result Value Ref Range   HM Diabetic Eye Exam No Retinopathy No Retinopathy      Assessment & Plan:   Problem List Items Addressed This Visit      Cardiovascular and Mediastinum   Hypertension - Primary    BPs stable and WNL, continue current regimen      Relevant Orders   Comprehensive metabolic panel     Respiratory   Sleep apnea    Stable off CPAP, continue to monitor        Endocrine   Diabetes mellitus  associated with hormonal etiology ([place])    Recheck A1C, adjust as needed. Continue working on diet and activity       Relevant Orders   HgB A1c     Other   Hyperlipidemia    Recheck lipids, adjust as needed.       Relevant Orders   Lipid Panel w/o Chol/HDL Ratio       Follow up plan: Return in about 6 months (around 09/27/2019) for CPE.

## 2019-03-27 NOTE — Assessment & Plan Note (Signed)
Recheck A1C, adjust as needed. Continue working on diet and activity

## 2019-03-27 NOTE — Assessment & Plan Note (Signed)
Recheck lipids, adjust as needed 

## 2019-03-27 NOTE — Assessment & Plan Note (Signed)
BPs stable and WNL, continue current regimen 

## 2019-03-27 NOTE — Assessment & Plan Note (Signed)
Stable off CPAP, continue to monitor

## 2019-03-28 LAB — COMPREHENSIVE METABOLIC PANEL
ALT: 25 IU/L (ref 0–44)
AST: 20 IU/L (ref 0–40)
Albumin/Globulin Ratio: 1.9 (ref 1.2–2.2)
Albumin: 4.7 g/dL (ref 3.8–4.8)
Alkaline Phosphatase: 86 IU/L (ref 39–117)
BUN/Creatinine Ratio: 13 (ref 10–24)
BUN: 11 mg/dL (ref 8–27)
Bilirubin Total: 0.6 mg/dL (ref 0.0–1.2)
CO2: 20 mmol/L (ref 20–29)
Calcium: 9.5 mg/dL (ref 8.6–10.2)
Chloride: 102 mmol/L (ref 96–106)
Creatinine, Ser: 0.87 mg/dL (ref 0.76–1.27)
GFR calc Af Amer: 106 mL/min/{1.73_m2} (ref >59)
GFR calc non Af Amer: 92 mL/min/{1.73_m2} (ref >59)
Globulin, Total: 2.5 g/dL (ref 1.5–4.5)
Glucose: 199 mg/dL — ABNORMAL HIGH (ref 65–99)
Potassium: 4.2 mmol/L (ref 3.5–5.2)
Sodium: 140 mmol/L (ref 134–144)
Total Protein: 7.2 g/dL (ref 6.0–8.5)

## 2019-03-28 LAB — LIPID PANEL W/O CHOL/HDL RATIO
Cholesterol, Total: 213 mg/dL — ABNORMAL HIGH (ref 100–199)
HDL: 50 mg/dL (ref >39)
LDL Chol Calc (NIH): 123 mg/dL — ABNORMAL HIGH (ref 0–99)
Triglycerides: 230 mg/dL — ABNORMAL HIGH (ref 0–149)
VLDL Cholesterol Cal: 40 mg/dL (ref 5–40)

## 2019-03-28 LAB — HEMOGLOBIN A1C
Est. average glucose Bld gHb Est-mCnc: 177 mg/dL
Hgb A1c MFr Bld: 7.8 % — ABNORMAL HIGH (ref 4.8–5.6)

## 2019-03-30 ENCOUNTER — Telehealth: Payer: Self-pay | Admitting: Family Medicine

## 2019-03-30 MED ORDER — XIGDUO XR 10-1000 MG PO TB24: 1.0000 | ORAL_TABLET | Freq: Every day | ORAL | 0 refills | Status: DC

## 2019-03-30 MED ORDER — ATORVASTATIN CALCIUM 20 MG PO TABS: 20.0000 mg | ORAL_TABLET | Freq: Every day | ORAL | 1 refills | Status: DC

## 2019-03-30 NOTE — Telephone Encounter (Signed)
Please let him know his A1C continues to be poorly controlled so I increased his xigduo dose. I also sent over lipitor as his cholesterol continues to be above goal as well. Let's see him back in 3 months to recheck A1C

## 2019-04-01 ENCOUNTER — Other Ambulatory Visit: Payer: Self-pay | Admitting: Family Medicine

## 2019-04-01 ENCOUNTER — Telehealth: Payer: Self-pay

## 2019-04-01 NOTE — Telephone Encounter (Signed)
Called pt, no answer, LVM requesting call back.  

## 2019-04-01 NOTE — Telephone Encounter (Signed)
Patient states that he will not take the cholesterol medication and that he has an appt scheduled for 6 months, that he will keep it at that.

## 2019-04-01 NOTE — Telephone Encounter (Addendum)
Prior Authorization initiated via CoverMyMeds for Xigduo XR 10-1000MG  er tablets Key: [id number]

## 2019-04-01 NOTE — Telephone Encounter (Signed)
PA Approved

## 2019-04-14 ENCOUNTER — Ambulatory Visit: Payer: Self-pay | Attending: Internal Medicine

## 2019-04-14 DIAGNOSIS — Z23 Encounter for immunization: Secondary | ICD-10-CM

## 2019-04-14 NOTE — Progress Notes (Signed)
   [id number] Vaccination [facility]  Name:  Arush Mica.    MRN: [id number] DOB: November 02, 1955  04/14/2019  Mr. Chimenti was observed post Covid-19 immunization for 15 minutes without incident. He was provided with Vaccine Information Sheet and instruction to access the V-Safe system.   Mr. Stallworth was instructed to call 911 with any severe reactions post vaccine: Marland Kitchen Difficulty breathing  . Swelling of face and throat  . A fast heartbeat  . A bad rash all over body  . Dizziness and weakness   Immunizations Administered    Name Date Dose VIS Date Route   Pfizer COVID-19 Vaccine 04/14/2019  4:38 PM 0.3 mL 12/28/2018 Intramuscular   Manufacturer: [place]   Lot: [id number]   [place]: [id number]

## 2019-05-05 ENCOUNTER — Ambulatory Visit: Payer: Self-pay

## 2019-05-12 ENCOUNTER — Ambulatory Visit: Payer: Self-pay | Attending: Internal Medicine

## 2019-05-12 DIAGNOSIS — Z23 Encounter for immunization: Secondary | ICD-10-CM

## 2019-05-12 NOTE — Progress Notes (Signed)
   [id number] Vaccination Clinic  Name:  Philip Ryan.    MRN: [id number] DOB: 1955-09-02  05/12/2019  Philip Ryan was observed post Covid-19 immunization for 15 minutes without incident. He was provided with Vaccine Information Sheet and instruction to access the V-Safe system.   Philip Ryan was instructed to call 911 with any severe reactions post vaccine: Marland Kitchen Difficulty breathing  . Swelling of face and throat  . A fast heartbeat  . A bad rash all over body  . Dizziness and weakness   Immunizations Administered    Name Date Dose VIS Date Route   Pfizer COVID-19 Vaccine 05/12/2019  3:13 PM 0.3 mL 03/13/2018 Intramuscular   Manufacturer: [employer]   Lot: [id number]   [place]: [id number]

## 2019-05-16 ENCOUNTER — Telehealth: Payer: Self-pay | Admitting: Family Medicine

## 2019-05-16 NOTE — Telephone Encounter (Signed)
Copied from [place] [id number]. Topic: General - Inquiry >> May 16, 2019 11:36 AM Mathis Bud wrote: Reason for CRM: patient wife called requesting to get a similar medication to  Dapagliflozin-metFORMIN HCl ER (XIGDUO XR) 10-998 MG TB24  or a PA for medication.   Patient wife states medication is too expensive.  Patient is requesting to get new medication asap. Call back Allen

## 2019-05-16 NOTE — Telephone Encounter (Signed)
Called and spoke with patient, he will just [employer] and let us know what is preferred.

## 2019-05-16 NOTE — Telephone Encounter (Signed)
It would depend on his formulary, they can check if there's a covered alternative or the other option would be trying to separate the components into two separate medications to see if that's more affordable

## 2019-05-16 NOTE — Telephone Encounter (Signed)
Is there a cheaper medication the patient can try?

## 2019-07-18 ENCOUNTER — Other Ambulatory Visit: Payer: Self-pay | Admitting: Family Medicine

## 2019-07-18 NOTE — Telephone Encounter (Signed)
Requested Prescriptions  Pending Prescriptions Disp Refills  . XIGDUO XR 10-998 MG TB24 [Pharmacy Med Name: XIGDUO XR 10-998 MG TAB] 90 tablet 0    Sig: TAKE ONE TABLET BY MOUTH EVERY DAY     Endocrinology:  Diabetes - Biguanide + SGLT2 Inhibitor Combos Failed - 07/18/2019 11:44 AM      Failed - LDL in normal range and within 360 days    LDL Chol Calc (NIH)  Date Value Ref Range Status  03/27/2019 123 (H) 0 - 99 mg/dL Final         Passed - Cr in normal range and within 360 days    Creatinine, Ser  Date Value Ref Range Status  03/27/2019 0.87 0.76 - 1.27 mg/dL Final         Passed - HBA1C is between 0 and 7.9 and within 180 days    Hemoglobin A1C  Date Value Ref Range Status  09/08/2015 7.8  Final   HB A1C (BAYER DCA - WAIVED)  Date Value Ref Range Status  09/20/2018 7.1 (H) <7.0 % Final    Comment:                                          Diabetic Adult            <7.0                                       Healthy Adult        4.3 - 5.7                                                           (DCCT/NGSP) American Diabetes Association's Summary of Glycemic Recommendations for Adults with Diabetes: Hemoglobin A1c <7.0%. More stringent glycemic goals (A1c <6.0%) may further reduce complications at the cost of increased risk of hypoglycemia.    Hgb A1c MFr Bld  Date Value Ref Range Status  03/27/2019 7.8 (H) 4.8 - 5.6 % Final    Comment:             Prediabetes: 5.7 - 6.4          Diabetes: >6.4          Glycemic control for adults with diabetes: <7.0          Passed - eGFR in normal range and within 360 days    GFR calc Af Amer  Date Value Ref Range Status  03/27/2019 106 >59 mL/min/1.73 Final   GFR calc non Af Amer  Date Value Ref Range Status  03/27/2019 92 >59 mL/min/1.73 Final         Passed - Valid encounter within last 6 months    Recent Outpatient Visits          3 months ago Essential hypertension   [facility],  [place]   10 months ago Annual physical exam   [place], [place]   11 months ago PE (physical exam), annual   [facility] [place], Jeannette How, [place]   1 year ago Type 2 diabetes  mellitus with complication, without long-term current use of insulin [facility])   [place], Jeannette How, [place]   1 year ago PE (physical exam), annual   [place], [place]      Future Appointments            In 2 months Orene Desanctis, Lilia Argue, [place], [place]

## 2019-08-21 ENCOUNTER — Other Ambulatory Visit: Payer: Self-pay | Admitting: Family Medicine

## 2019-08-21 DIAGNOSIS — I10 Essential (primary) hypertension: Secondary | ICD-10-CM

## 2019-08-28 ENCOUNTER — Telehealth: Payer: Self-pay | Admitting: Family Medicine

## 2019-08-28 DIAGNOSIS — J301 Allergic rhinitis due to pollen: Secondary | ICD-10-CM

## 2019-08-28 NOTE — Telephone Encounter (Signed)
Patient is returning phone call, regarding requesting allergy shot. Patient states he has been contacting the practice for years to set up the allergy shot, Pt stated he Usually comes in August to get this done.Per [place] route to PCP due to [place] not providing allergy shots here unsure if he needs orders or appointment.

## 2019-08-28 NOTE — Telephone Encounter (Signed)
Lvm with message below and to give [place] a call back .

## 2019-08-28 NOTE — Telephone Encounter (Signed)
Copied from [place] [id number]. Topic: Appointment Scheduling - Scheduling Inquiry for Clinic >> Aug 28, 2019  8:28 AM Oneta Rack wrote: Reason for CRM: patient would like to scheduled allergy injection appointment with the nurse only, please advise  Is he due for this? Does provider need to approve first?

## 2019-08-28 NOTE — Telephone Encounter (Signed)
I would let him know we do not do these here, but he sees Dr. Wynetta Emery in September and can discuss with her -- could get referral to allergist at that time who performs those.

## 2019-08-28 NOTE — Telephone Encounter (Signed)
Lvm with information below 

## 2019-08-28 NOTE — Telephone Encounter (Signed)
Patient would need an appointment to discuss and have order entered by a provider if need be. Not exactly sure what injection patient is referring to as we do not do allergy shots here.

## 2019-08-30 NOTE — Telephone Encounter (Signed)
Lvm with information from Dr,.Johonsn to come in for steroid shot and she can put the order in.

## 2019-08-30 NOTE — Telephone Encounter (Signed)
If he's talking about the steroid shot he usually gets in the late summer/early fall- we can do that for him. Just let me know when he wants to come in and I'll put in the order (40mg  triamcinalone x1)

## 2019-09-02 MED ORDER — TRIAMCINOLONE ACETONIDE 40 MG/ML IJ SUSP
40.0000 mg | Freq: Once | INTRAMUSCULAR | Status: AC
  Administered 2019-09-04: 40 mg via INTRAMUSCULAR

## 2019-09-02 NOTE — Addendum Note (Signed)
Addended by: Valerie Roys on: 09/02/2019 09:32 AM   Modules accepted: Orders

## 2019-09-02 NOTE — Telephone Encounter (Signed)
Pt has apt for 09/05/2019 to come in for the 40mg  triamcinalone

## 2019-09-02 NOTE — Telephone Encounter (Signed)
Order in.

## 2019-09-04 ENCOUNTER — Ambulatory Visit (INDEPENDENT_AMBULATORY_CARE_PROVIDER_SITE_OTHER): Payer: BLUE CROSS/BLUE SHIELD

## 2019-09-04 ENCOUNTER — Other Ambulatory Visit: Payer: Self-pay

## 2019-09-04 DIAGNOSIS — J302 Other seasonal allergic rhinitis: Secondary | ICD-10-CM

## 2019-09-04 DIAGNOSIS — J301 Allergic rhinitis due to pollen: Secondary | ICD-10-CM

## 2019-09-05 ENCOUNTER — Ambulatory Visit: Payer: PRIVATE HEALTH INSURANCE

## 2019-10-02 ENCOUNTER — Encounter: Payer: PRIVATE HEALTH INSURANCE | Admitting: Family Medicine

## 2019-10-09 ENCOUNTER — Other Ambulatory Visit: Payer: Self-pay

## 2019-10-09 ENCOUNTER — Ambulatory Visit (INDEPENDENT_AMBULATORY_CARE_PROVIDER_SITE_OTHER): Payer: BLUE CROSS/BLUE SHIELD | Admitting: Family Medicine

## 2019-10-09 ENCOUNTER — Encounter: Payer: Self-pay | Admitting: Family Medicine

## 2019-10-09 ENCOUNTER — Encounter: Payer: PRIVATE HEALTH INSURANCE | Admitting: Family Medicine

## 2019-10-09 VITALS — BP 137/89 | HR 87 | Temp 98.2°F | Ht 69.6 in | Wt 206.0 lb

## 2019-10-09 DIAGNOSIS — E1169 Type 2 diabetes mellitus with other specified complication: Secondary | ICD-10-CM

## 2019-10-09 DIAGNOSIS — Z23 Encounter for immunization: Secondary | ICD-10-CM

## 2019-10-09 DIAGNOSIS — Z1211 Encounter for screening for malignant neoplasm of colon: Secondary | ICD-10-CM | POA: Diagnosis not present

## 2019-10-09 DIAGNOSIS — Z0000 Encounter for general adult medical examination without abnormal findings: Secondary | ICD-10-CM | POA: Diagnosis not present

## 2019-10-09 DIAGNOSIS — I10 Essential (primary) hypertension: Secondary | ICD-10-CM | POA: Diagnosis not present

## 2019-10-09 DIAGNOSIS — E785 Hyperlipidemia, unspecified: Secondary | ICD-10-CM

## 2019-10-09 LAB — URINALYSIS, ROUTINE W REFLEX MICROSCOPIC
Bilirubin, UA: NEGATIVE
Leukocytes,UA: NEGATIVE
Nitrite, UA: NEGATIVE
Protein,UA: NEGATIVE
RBC, UA: NEGATIVE
Specific Gravity, UA: 1.01 (ref 1.005–1.030)
Urobilinogen, Ur: 0.2 mg/dL (ref 0.2–1.0)
pH, UA: 7 (ref 5.0–7.5)

## 2019-10-09 LAB — MICROALBUMIN, URINE WAIVED
Creatinine, Urine Waived: 50 mg/dL (ref 10–300)
Microalb, Ur Waived: 30 mg/L — ABNORMAL HIGH (ref 0–19)

## 2019-10-09 LAB — BAYER DCA HB A1C WAIVED: HB A1C (BAYER DCA - WAIVED): 8.7 % — ABNORMAL HIGH (ref <7.0)

## 2019-10-09 MED ORDER — TELMISARTAN 80 MG PO TABS: 80.0000 mg | ORAL_TABLET | Freq: Every day | ORAL | 1 refills | Status: DC

## 2019-10-09 MED ORDER — XIGDUO XR 10-1000 MG PO TB24
1.0000 | ORAL_TABLET | Freq: Every day | ORAL | 1 refills | Status: DC
Start: 2019-10-09 — End: 2020-04-20

## 2019-10-09 MED ORDER — AMLODIPINE BESYLATE 10 MG PO TABS: 10.0000 mg | ORAL_TABLET | Freq: Every day | ORAL | 1 refills | Status: DC

## 2019-10-09 NOTE — Progress Notes (Signed)
BP 137/89   Pulse 87   Temp 98.2 F (36.8 C) (Oral)   Ht 5' 9.6" (1.768 m)   Wt 206 lb (93.4 kg)   SpO2 96%   BMI 29.90 kg/m    Subjective:    Patient ID: Philip Ryan., male    DOB: Nov 05, 1955, 64 y.o.   MRN: [id number]  HPI: Philip Ryan. is a 64 y.o. male presenting on 10/09/2019 for comprehensive medical examination. Current medical complaints include:  DIABETES Hypoglycemic episodes:no Polydipsia/polyuria: no Visual disturbance: no Chest pain: no Paresthesias: no Glucose Monitoring: no  Accucheck frequency: Not Checking Taking Insulin?: no Blood Pressure Monitoring: not checking Retinal Examination: Up to Date Foot Exam: done today Diabetic Education: Completed Pneumovax: Up to Date Influenza: Up to Date Aspirin: yes  HYPERTENSION / HYPERLIPIDEMIA Satisfied with current treatment? yes Duration of hypertension: chronic BP monitoring frequency: not checking BP medication side effects: no Past BP meds: telmisartan and amlodipine Duration of hyperlipidemia: chronic Cholesterol medication side effects: refuses medication Cholesterol supplements: none Past cholesterol medications: none Medication compliance: excellent compliance Aspirin: yes Recent stressors: no Recurrent headaches: no Visual changes: no Palpitations: no Dyspnea: no Chest pain: no Lower extremity edema: no Dizzy/lightheaded: no  Interim Problems from his last visit: no  Depression Screen done today and results listed below:  Depression screen [facility] 2/9 10/09/2019 09/20/2018 08/02/2017 02/06/2017 10/12/2016  Decreased Interest 0 0 0 0 0  Down, Depressed, Hopeless 0 0 0 0 0  PHQ - 2 Score 0 0 0 0 0  Altered sleeping 0 0 - - -  Tired, decreased energy 0 0 - - -  Change in appetite 0 0 - - -  Feeling bad or failure about yourself  0 0 - - -  Trouble concentrating 0 1 - - -  Moving slowly or fidgety/restless 0 0 - - -  Suicidal thoughts 0 0 - - -  PHQ-9 Score 0 1 - - -     Past Medical History:  Past Medical History:  Diagnosis Date  . Allergy   . Diabetes mellitus without complication ([place])   . Hyperlipidemia   . Hypertension   . Sleep apnea    no CPAP    Surgical History:  Past Surgical History:  Procedure Laterality Date  . COLONOSCOPY    . KNEE ARTHROSCOPY Right   . SHOULDER ARTHROSCOPY WITH BICEPS TENDON REPAIR Left 08/29/2016   Procedure: SHOULDER ARTHROSCOPY WITH BICEPS TENDON REPAIR;  Surgeon: Thornton Park, MD;  Location: [place];  Service: Orthopedics;  Laterality: Left;  . SHOULDER ARTHROSCOPY WITH OPEN ROTATOR CUFF REPAIR AND DISTAL CLAVICLE ACROMINECTOMY Left 08/29/2016   Procedure: SHOULDER ARTHROSCOPY WITH OPEN ROTATOR CUFF REPAIR AND DISTAL CLAVICLE ACROMINECTOMY;  Surgeon: Thornton Park, MD;  Location: [place];  Service: Orthopedics;  Laterality: Left;  Sleep Apnea - No CPAP    Medications:  Current Outpatient Medications on File Prior to Visit  Medication Sig  . aspirin EC 81 MG tablet Take 81 mg by mouth daily.  . Multiple Vitamin (MULTIVITAMIN) tablet Take 1 tablet by mouth daily.   No current facility-administered medications on file prior to visit.    Allergies:  No Known Allergies  Social History:  Social History   Socioeconomic History  . Marital status: Married    Spouse name: Not on file  . Number of children: Not on file  . Years of education: Not on file  . Highest education level: Not on file  Occupational  History  . Not on file  Tobacco Use  . Smoking status: Former Smoker    Types: Cigarettes    Quit date: 07/28/1974    Years since quitting: 45.2  . Smokeless tobacco: Never Used  Substance and Sexual Activity  . Alcohol use: Yes    Alcohol/week: 2.0 standard drinks    Types: 2 Cans of beer per week    Comment: bi weekly use per pt.  . Drug use: No  . Sexual activity: Not on file  Other Topics Concern  . Not on file  Social History Narrative  . Not on file    Social Determinants of Health   Financial Resource Strain:   . Difficulty of Paying Living Expenses: Not on file  Food Insecurity:   . Worried About Charity fundraiser in the Last Year: Not on file  . Ran Out of Food in the Last Year: Not on file  Transportation Needs:   . Lack of Transportation (Medical): Not on file  . Lack of Transportation (Non-Medical): Not on file  Physical Activity:   . Days of Exercise per Week: Not on file  . Minutes of Exercise per Session: Not on file  Stress:   . Feeling of Stress : Not on file  Social Connections:   . Frequency of Communication with Friends and Family: Not on file  . Frequency of Social Gatherings with Friends and Family: Not on file  . Attends Religious Services: Not on file  . Active Member of Clubs or Organizations: Not on file  . Attends Archivist Meetings: Not on file  . Marital Status: Not on file  Intimate Partner Violence:   . Fear of Current or Ex-Partner: Not on file  . Emotionally Abused: Not on file  . Physically Abused: Not on file  . Sexually Abused: Not on file   Social History   Tobacco Use  Smoking Status Former Smoker  . Types: Cigarettes  . Quit date: 07/28/1974  . Years since quitting: 45.2  Smokeless Tobacco Never Used   Social History   Substance and Sexual Activity  Alcohol Use Yes  . Alcohol/week: 2.0 standard drinks  . Types: 2 Cans of beer per week   Comment: bi weekly use per pt.    Family History:  Family History  Problem Relation Age of Onset  . Diabetes Mother   . CAD Maternal Grandfather   . Heart attack Maternal Grandfather   . Cancer Maternal Grandfather   . Colon cancer Neg Hx     Past medical history, surgical history, medications, allergies, family history and social history reviewed with patient today and changes made to appropriate areas of the chart.   Review of Systems  Constitutional: Negative.   HENT: Positive for tinnitus. Negative for congestion, ear  discharge, ear pain, hearing loss, nosebleeds, sinus pain and sore throat.   Eyes: Negative.   Respiratory: Negative.  Negative for stridor.   Cardiovascular: Negative.   Gastrointestinal: Negative.   Genitourinary: Negative.   Musculoskeletal: Negative.   Skin: Negative.   Neurological: Negative.   Endo/Heme/Allergies: Positive for environmental allergies. Negative for polydipsia. Does not bruise/bleed easily.  Psychiatric/Behavioral: Negative.     All other ROS negative except what is listed above and in the HPI.      Objective:    BP 137/89   Pulse 87   Temp 98.2 F (36.8 C) (Oral)   Ht 5' 9.6" (1.768 m)   Wt 206 lb (93.4 kg)  SpO2 96%   BMI 29.90 kg/m   Wt Readings from Last 3 Encounters:  10/09/19 206 lb (93.4 kg)  03/27/19 205 lb (93 kg)  09/20/18 203 lb (92.1 kg)    Physical Exam Vitals and nursing note reviewed.  Constitutional:      General: He is not in acute distress.    Appearance: Normal appearance. He is obese. He is not ill-appearing, toxic-appearing or diaphoretic.  HENT:     Head: Normocephalic and atraumatic.     Right Ear: Tympanic membrane, ear canal and external ear normal. There is no impacted cerumen.     Left Ear: Tympanic membrane, ear canal and external ear normal. There is no impacted cerumen.     Nose: Nose normal. No congestion or rhinorrhea.     Mouth/Throat:     Mouth: Mucous membranes are moist.     Pharynx: Oropharynx is clear. No oropharyngeal exudate or posterior oropharyngeal erythema.  Eyes:     General: No scleral icterus.       Right eye: No discharge.        Left eye: No discharge.     Extraocular Movements: Extraocular movements intact.     Conjunctiva/sclera: Conjunctivae normal.     Pupils: Pupils are equal, round, and reactive to light.  Neck:     Vascular: No carotid bruit.  Cardiovascular:     Rate and Rhythm: Normal rate and regular rhythm.     Pulses: Normal pulses.     Heart sounds: No murmur heard.  No  friction rub. No gallop.   Pulmonary:     Effort: Pulmonary effort is normal. No respiratory distress.     Breath sounds: Normal breath sounds. No stridor. No wheezing, rhonchi or rales.  Chest:     Chest wall: No tenderness.  Abdominal:     General: Abdomen is flat. Bowel sounds are normal. There is no distension.     Palpations: Abdomen is soft. There is no mass.     Tenderness: There is no abdominal tenderness. There is no right CVA tenderness, left CVA tenderness, guarding or rebound.     Hernia: No hernia is present.  Genitourinary:    Comments: Genital exam deferred with shared decision making Musculoskeletal:        General: No swelling, tenderness, deformity or signs of injury.     Cervical back: Normal range of motion and neck supple. No rigidity. No muscular tenderness.     Right lower leg: No edema.     Left lower leg: No edema.  Lymphadenopathy:     Cervical: No cervical adenopathy.  Skin:    General: Skin is warm and dry.     Capillary Refill: Capillary refill takes less than 2 seconds.     Coloration: Skin is not jaundiced or pale.     Findings: No bruising, erythema, lesion or rash.  Neurological:     General: No focal deficit present.     Mental Status: He is alert and oriented to person, place, and time.     Cranial Nerves: No cranial nerve deficit.     Sensory: No sensory deficit.     Motor: No weakness.     Coordination: Coordination normal.     Gait: Gait normal.     Deep Tendon Reflexes: Reflexes normal.  Psychiatric:        Mood and Affect: Mood normal.        Behavior: Behavior normal.        Thought Content:  Thought content normal.        Judgment: Judgment normal.     Results for orders placed or performed in visit on 03/27/19  Comprehensive metabolic panel  Result Value Ref Range   Glucose 199 (H) 65 - 99 mg/dL   BUN 11 8 - 27 mg/dL   Creatinine, Ser 0.87 0.76 - 1.27 mg/dL   GFR calc non Af Amer 92 >59 mL/min/1.73   GFR calc Af Amer 106 >59  mL/min/1.73   BUN/Creatinine Ratio 13 10 - 24   Sodium 140 134 - 144 mmol/L   Potassium 4.2 3.5 - 5.2 mmol/L   Chloride 102 96 - 106 mmol/L   CO2 20 20 - 29 mmol/L   Calcium 9.5 8.6 - 10.2 mg/dL   Total Protein 7.2 6.0 - 8.5 g/dL   Albumin 4.7 3.8 - 4.8 g/dL   Globulin, Total 2.5 1.5 - 4.5 g/dL   Albumin/Globulin Ratio 1.9 1.2 - 2.2   Bilirubin Total 0.6 0.0 - 1.2 mg/dL   Alkaline Phosphatase 86 39 - 117 IU/L   AST 20 0 - 40 IU/L   ALT 25 0 - 44 IU/L  Lipid Panel w/o Chol/HDL Ratio  Result Value Ref Range   Cholesterol, Total 213 (H) 100 - 199 mg/dL   Triglycerides 230 (H) 0 - 149 mg/dL   HDL 50 >39 mg/dL   VLDL Cholesterol Cal 40 5 - 40 mg/dL   LDL Chol Calc (NIH) 123 (H) 0 - 99 mg/dL  HgB A1c  Result Value Ref Range   Hgb A1c MFr Bld 7.8 (H) 4.8 - 5.6 %   Est. average glucose Bld gHb Est-mCnc 177 mg/dL      Assessment & Plan:   Problem List Items Addressed This Visit      Cardiovascular and Mediastinum   Hypertension    Under good control on current regimen. Continue current regimen. Continue to monitor. Call with any concerns. Refills given. Labs drawn today.       Relevant Medications   amLODipine (NORVASC) 10 MG tablet   telmisartan (MICARDIS) 80 MG tablet     Endocrine   Diabetes mellitus associated with hormonal etiology ([place])    Not under good control with A1c of 8.7 up from 7.8- will really watch his diet and recheck in 3 months. If still running high, will add medicine. Continue to monitor.       Relevant Medications   telmisartan (MICARDIS) 80 MG tablet   Dapagliflozin-metFORMIN HCl ER (XIGDUO XR) 10-998 MG TB24   Other Relevant Orders   Bayer DCA Hb A1c Waived   Microalbumin, Urine Waived     Other   Hyperlipidemia    Refuses statin. Will consider and let us know if he changes his mind. Labs drawn today.      Relevant Medications   amLODipine (NORVASC) 10 MG tablet   telmisartan (MICARDIS) 80 MG tablet    Other Visit Diagnoses    Routine  general medical examination at a health care facility    -  Primary   Vaccines up to date. Screening labs checked today. Colonoscopy due next year. Continue diet and exercise. Call with any concerns. Continue to monitor.    Relevant Orders   CBC with Differential/Platelet   Comprehensive metabolic panel   TSH   PSA   Lipid Panel w/o Chol/HDL Ratio   Urinalysis, Routine w reflex microscopic   Screening for colon cancer       Referral generated today.   Relevant  Orders   Ambulatory referral to Gastroenterology   Flu vaccine need       Flu shot given today.   Relevant Orders   Flu Vaccine QUAD 36+ mos IM (Completed)       Discussed aspirin prophylaxis for myocardial infarction prevention and decision was made to continue ASA  LABORATORY TESTING:  Health maintenance labs ordered today as discussed above.   The natural history of prostate cancer and ongoing controversy regarding screening and potential treatment outcomes of prostate cancer has been discussed with the patient. The meaning of a false positive PSA and a false negative PSA has been discussed. He indicates understanding of the limitations of this screening test and wishes to proceed with screening PSA testing.   IMMUNIZATIONS:   - Tdap: Tetanus vaccination status reviewed: last tetanus booster within 10 years. - Influenza: Administered today - Pneumovax: Up to date - Prevnar: Not applicable - COVID: Up to date  SCREENING: - Colonoscopy: Up to date  Discussed with patient purpose of the colonoscopy is to detect colon cancer at curable precancerous or early stages    PATIENT COUNSELING:    Sexuality: Discussed sexually transmitted diseases, partner selection, use of condoms, avoidance of unintended pregnancy  and contraceptive alternatives.   Advised to avoid cigarette smoking.  I discussed with the patient that most people either abstain from alcohol or drink within safe limits (<=14/week and <=4 drinks/occasion  for males, <=7/weeks and <= 3 drinks/occasion for females) and that the risk for alcohol disorders and other health effects rises proportionally with the number of drinks per week and how often a drinker exceeds daily limits.  Discussed cessation/primary prevention of drug use and availability of treatment for abuse.   Diet: Encouraged to adjust caloric intake to maintain  or achieve ideal body weight, to reduce intake of dietary saturated fat and total fat, to limit sodium intake by avoiding high sodium foods and not adding table salt, and to maintain adequate dietary potassium and calcium preferably from fresh fruits, vegetables, and low-fat dairy products.    stressed the importance of regular exercise  Injury prevention: Discussed safety belts, safety helmets, smoke detector, smoking near bedding or upholstery.   Dental health: Discussed importance of regular tooth brushing, flossing, and dental visits.   Follow up plan: NEXT PREVENTATIVE PHYSICAL DUE IN 1 YEAR. Return in about 3 months (around 01/08/2020).

## 2019-10-09 NOTE — Patient Instructions (Addendum)

## 2019-10-09 NOTE — Assessment & Plan Note (Signed)
Not under good control with A1c of 8.7 up from 7.8- will really watch his diet and recheck in 3 months. If still running high, will add medicine. Continue to monitor.

## 2019-10-09 NOTE — Assessment & Plan Note (Signed)
Refuses statin. Will consider and let us know if he changes his mind. Labs drawn today.

## 2019-10-09 NOTE — Assessment & Plan Note (Signed)
Under good control on current regimen. Continue current regimen. Continue to monitor. Call with any concerns. Refills given. Labs drawn today.   

## 2019-10-10 LAB — COMPREHENSIVE METABOLIC PANEL
ALT: 25 IU/L (ref 0–44)
AST: 21 IU/L (ref 0–40)
Albumin/Globulin Ratio: 1.9 (ref 1.2–2.2)
Albumin: 4.7 g/dL (ref 3.8–4.8)
Alkaline Phosphatase: 82 IU/L (ref 44–121)
BUN/Creatinine Ratio: 16 (ref 10–24)
BUN: 12 mg/dL (ref 8–27)
Bilirubin Total: 0.7 mg/dL (ref 0.0–1.2)
CO2: 21 mmol/L (ref 20–29)
Calcium: 9.4 mg/dL (ref 8.6–10.2)
Chloride: 101 mmol/L (ref 96–106)
Creatinine, Ser: 0.73 mg/dL — ABNORMAL LOW (ref 0.76–1.27)
GFR calc Af Amer: 114 mL/min/{1.73_m2} (ref >59)
GFR calc non Af Amer: 99 mL/min/{1.73_m2} (ref >59)
Globulin, Total: 2.5 g/dL (ref 1.5–4.5)
Glucose: 178 mg/dL — ABNORMAL HIGH (ref 65–99)
Potassium: 4.2 mmol/L (ref 3.5–5.2)
Sodium: 138 mmol/L (ref 134–144)
Total Protein: 7.2 g/dL (ref 6.0–8.5)

## 2019-10-10 LAB — CBC WITH DIFFERENTIAL/PLATELET
Basophils Absolute: 0 10*3/uL (ref 0.0–0.2)
Basos: 1 %
EOS (ABSOLUTE): 0.2 10*3/uL (ref 0.0–0.4)
Eos: 4 %
Hematocrit: 49.7 % (ref 37.5–51.0)
Hemoglobin: 16.4 g/dL (ref 13.0–17.7)
Immature Grans (Abs): 0 10*3/uL (ref 0.0–0.1)
Immature Granulocytes: 0 %
Lymphocytes Absolute: 1.2 10*3/uL (ref 0.7–3.1)
Lymphs: 28 %
MCH: 29.6 pg (ref 26.6–33.0)
MCHC: 33 g/dL (ref 31.5–35.7)
MCV: 90 fL (ref 79–97)
Monocytes Absolute: 0.3 10*3/uL (ref 0.1–0.9)
Monocytes: 6 %
Neutrophils Absolute: 2.6 10*3/uL (ref 1.4–7.0)
Neutrophils: 61 %
Platelets: 190 10*3/uL (ref 150–450)
RBC: 5.54 x10E6/uL (ref 4.14–5.80)
RDW: 13 % (ref 11.6–15.4)
WBC: 4.3 10*3/uL (ref 3.4–10.8)

## 2019-10-10 LAB — PSA: Prostate Specific Ag, Serum: 0.7 ng/mL (ref 0.0–4.0)

## 2019-10-10 LAB — LIPID PANEL W/O CHOL/HDL RATIO
Cholesterol, Total: 239 mg/dL — ABNORMAL HIGH (ref 100–199)
HDL: 52 mg/dL (ref >39)
LDL Chol Calc (NIH): 155 mg/dL — ABNORMAL HIGH (ref 0–99)
Triglycerides: 176 mg/dL — ABNORMAL HIGH (ref 0–149)
VLDL Cholesterol Cal: 32 mg/dL (ref 5–40)

## 2019-10-10 LAB — TSH: TSH: 3.05 u[IU]/mL (ref 0.450–4.500)

## 2019-10-13 ENCOUNTER — Encounter: Payer: Self-pay | Admitting: Family Medicine

## 2019-12-24 LAB — HM DIABETES EYE EXAM

## 2020-01-09 ENCOUNTER — Ambulatory Visit: Payer: BLUE CROSS/BLUE SHIELD | Admitting: Family Medicine

## 2020-01-18 HISTORY — PX: COLONOSCOPY: SHX174

## 2020-02-04 ENCOUNTER — Ambulatory Visit: Payer: Self-pay | Admitting: Gastroenterology

## 2020-03-17 ENCOUNTER — Ambulatory Visit (INDEPENDENT_AMBULATORY_CARE_PROVIDER_SITE_OTHER): Payer: Self-pay | Admitting: Gastroenterology

## 2020-03-17 ENCOUNTER — Encounter: Payer: Self-pay | Admitting: Gastroenterology

## 2020-03-17 VITALS — BP 110/68 | HR 100 | Ht 70.0 in | Wt 209.0 lb

## 2020-03-17 DIAGNOSIS — Z800 Family history of malignant neoplasm of digestive organs: Secondary | ICD-10-CM

## 2020-03-17 MED ORDER — PLENVU 140 G PO SOLR: 1.0000 | ORAL | 0 refills | Status: DC

## 2020-03-17 NOTE — Progress Notes (Signed)
Review of pertinent gastrointestinal problems: 1.  History of adenomatous polyps.  Colonoscopy 2013 3 subcentimeter adenomas removed.  Colonoscopy February 2016 one subcentimeter adenoma removed.  Initially recommended for repeat colonoscopy at 5-year interval however newest colon cancer screening, polyp surveillance guidelines show that 7-year interval is probably more appropriate.   HPI: This is a very pleasant 65 year old man   I last saw him about 6 years ago at the time of a surveillance colonoscopy.  See those results summarized above.  Today he is here to discuss some new information about his family history.  He believes his maternal grandfather had colon cancer and he is also pretty sure that his mother currently has colon cancer, diagnosed in her 107s.  She has a colostomy bag now.  He himself has no GI symptoms, no specific constipation, no bleeding, no diarrhea, no serious abdominal pains.  His weight is overall stable   Review of systems: Pertinent positive and negative review of systems were noted in the above HPI section. All other review negative.   Past Medical History:  Diagnosis Date  . Allergy   . Diabetes mellitus without complication ([place])   . Hyperlipidemia   . Hypertension   . Sleep apnea    no CPAP    Past Surgical History:  Procedure Laterality Date  . COLONOSCOPY    . KNEE ARTHROSCOPY Right   . SHOULDER ARTHROSCOPY WITH BICEPS TENDON REPAIR Left 08/29/2016   Procedure: SHOULDER ARTHROSCOPY WITH BICEPS TENDON REPAIR;  Surgeon: Thornton Park, MD;  Location: [place];  Service: Orthopedics;  Laterality: Left;  . SHOULDER ARTHROSCOPY WITH OPEN ROTATOR CUFF REPAIR AND DISTAL CLAVICLE ACROMINECTOMY Left 08/29/2016   Procedure: SHOULDER ARTHROSCOPY WITH OPEN ROTATOR CUFF REPAIR AND DISTAL CLAVICLE ACROMINECTOMY;  Surgeon: Thornton Park, MD;  Location: [place];  Service: Orthopedics;  Laterality: Left;  Sleep Apnea - No CPAP     Current Outpatient Medications  Medication Sig Dispense Refill  . amLODipine (NORVASC) 10 MG tablet Take 1 tablet (10 mg total) by mouth daily. 90 tablet 1  . aspirin EC 81 MG tablet Take 81 mg by mouth daily.    . Dapagliflozin-metFORMIN HCl ER (XIGDUO XR) 10-998 MG TB24 Take 1 tablet by mouth daily. 90 tablet 1  . Multiple Vitamin (MULTIVITAMIN) tablet Take 1 tablet by mouth daily.    Marland Kitchen telmisartan (MICARDIS) 80 MG tablet Take 1 tablet (80 mg total) by mouth daily. 90 tablet 1   No current facility-administered medications for this visit.    Allergies as of 03/17/2020  . (No Known Allergies)    Family History  Problem Relation Age of Onset  . Diabetes Mother   . CAD Maternal Grandfather   . Heart attack Maternal Grandfather   . Cancer Maternal Grandfather   . Colon cancer Neg Hx     Social History   Socioeconomic History  . Marital status: Married    Spouse name: Not on file  . Number of children: Not on file  . Years of education: Not on file  . Highest education level: Not on file  Occupational History  . Not on file  Tobacco Use  . Smoking status: Former Smoker    Types: Cigarettes    Quit date: 07/28/1974    Years since quitting: 45.6  . Smokeless tobacco: Never Used  Substance and Sexual Activity  . Alcohol use: Yes    Alcohol/week: 2.0 standard drinks    Types: 2 Cans of beer per week  Comment: bi weekly use per pt.  . Drug use: No  . Sexual activity: Not on file  Other Topics Concern  . Not on file  Social History Narrative  . Not on file   Social Determinants of Health   Financial Resource Strain: Not on file  Food Insecurity: Not on file  Transportation Needs: Not on file  Physical Activity: Not on file  Stress: Not on file  Social Connections: Not on file  Intimate Partner Violence: Not on file     Physical Exam: BP 110/68   Pulse 100   Ht 5\' 10"  (1.778 m)   Wt 209 lb (94.8 kg)   BMI 29.99 kg/m  Constitutional: generally  well-appearing Psychiatric: alert and oriented x3 Eyes: extraocular movements intact Mouth: oral pharynx moist, no lesions Neck: supple no lymphadenopathy Cardiovascular: heart regular rate and rhythm Lungs: clear to auscultation bilaterally Abdomen: soft, nontender, nondistended, no obvious ascites, no peritoneal signs, normal bowel sounds Extremities: no lower extremity edema bilaterally Skin: no lesions on visible extremities   Assessment and plan: 65 y.o. male with family history of colon cancer, personal history of adenomatous colon polyps  We had a nice discussion about the evolution of colon polyp surveillance, colon cancer screening guidelines.  He understands that given this new information about his family history of colon cancer he is probably better suited having a colonoscopy now rather than 7 years from his last 65.  I see no reason for any further blood tests or imaging studies prior to then.   Please see the "Patient Instructions" section for addition details about the plan.   Owens Loffler, MD [facility] 03/17/2020, 8:30 AM  Cc: Guadalupe Maple, MD  Total time on date of encounter was 35  minutes (this included time spent preparing to see the patient reviewing records; obtaining and/or reviewing separately obtained history; performing a medically appropriate exam and/or evaluation; counseling and educating the patient and family if present; ordering medications, tests or procedures if applicable; and documenting clinical information in the health record).

## 2020-03-17 NOTE — Patient Instructions (Signed)
If you are age 64 or younger, your body mass index should be between 19-25. Your Body mass index is 29.99 kg/m. If this is out of the aformentioned range listed, please consider follow up with your Primary Care Provider.   You have been scheduled for a colonoscopy. Please follow written instructions given to you at your visit today.  Please pick up your prep supplies at the pharmacy within the next 1-3 days. If you use inhalers (even only as needed), please bring them with you on the day of your procedure.  Due to recent changes in healthcare laws, you may see the results of your imaging and laboratory studies on MyChart before your provider has had a chance to review them.  We understand that in some cases there may be results that are confusing or concerning to you. Not all laboratory results come back in the same time frame and the provider may be waiting for multiple results in order to interpret others.  Please give [place] 48 hours in order for your provider to thoroughly review all the results before contacting the office for clarification of your results.   Thank you for entrusting me with your care and choosing [facility].  Dr Ardis Hughs

## 2020-04-18 ENCOUNTER — Other Ambulatory Visit: Payer: Self-pay | Admitting: Family Medicine

## 2020-04-18 NOTE — Telephone Encounter (Signed)
Requested medication (s) are due for refill today: yes  Requested medication (s) are on the active medication list: yes  Last refill:  10/09/19  Future visit scheduled: no  Notes to clinic:  overdue lab work   Requested Prescriptions  Pending Prescriptions Disp Refills   XIGDUO XR 10-998 MG TB24 [Pharmacy Med Name: XIGDUO XR 10-998 MG TAB] 90 tablet 1    Sig: Take 1 tablet by mouth daily.      Endocrinology:  Diabetes - Biguanide + SGLT2 Inhibitor Combos Failed - 04/18/2020 10:26 AM      Failed - Cr in normal range and within 360 days    Creatinine, Ser  Date Value Ref Range Status  10/09/2019 0.73 (L) 0.76 - 1.27 mg/dL Final          Failed - LDL in normal range and within 360 days    LDL Chol Calc (NIH)  Date Value Ref Range Status  10/09/2019 155 (H) 0 - 99 mg/dL Final          Failed - HBA1C is between 0 and 7.9 and within 180 days    Hemoglobin A1C  Date Value Ref Range Status  09/08/2015 7.8  Final   HB A1C (BAYER DCA - WAIVED)  Date Value Ref Range Status  10/09/2019 8.7 (H) <7.0 % Final    Comment:                                          Diabetic Adult            <7.0                                       Healthy Adult        4.3 - 5.7                                                           (DCCT/NGSP) American Diabetes Association's Summary of Glycemic Recommendations for Adults with Diabetes: Hemoglobin A1c <7.0%. More stringent glycemic goals (A1c <6.0%) may further reduce complications at the cost of increased risk of hypoglycemia.           Failed - Valid encounter within last 6 months    Recent Outpatient Visits           6 months ago Routine general medical examination at a health care facility   [place], [place], DO   1 year ago Essential hypertension   [facility] [place], [place]   1 year ago Annual physical exam   [facility] [place], [place]   1 year ago PE  (physical exam), annual   [facility] [place], Jeannette How, MD   2 years ago Type 2 diabetes mellitus with complication, without long-term current use of insulin [facility])   [place], MD                Passed - eGFR in normal range and within 360 days    GFR calc Af Wyvonnia Lora  Date Value Ref Range Status  10/09/2019 114 >59 mL/min/1.73 Final    Comment:    **[facility] currently reports eGFR in compliance with the current**   recommendations of the [employer]. [facility] will   update reporting as new guidelines are published from the NKF-ASN   Task force.    GFR calc non Af Amer  Date Value Ref Range Status  10/09/2019 99 >59 mL/min/1.73 Final

## 2020-04-29 ENCOUNTER — Ambulatory Visit (AMBULATORY_SURGERY_CENTER): Payer: 59 | Admitting: Gastroenterology

## 2020-04-29 ENCOUNTER — Other Ambulatory Visit: Payer: Self-pay

## 2020-04-29 ENCOUNTER — Encounter: Payer: Self-pay | Admitting: Gastroenterology

## 2020-04-29 VITALS — BP 100/58 | HR 92 | Temp 96.9°F | Resp 12 | Ht 70.0 in | Wt 209.0 lb

## 2020-04-29 DIAGNOSIS — Z86010 Personal history of colonic polyps: Secondary | ICD-10-CM

## 2020-04-29 DIAGNOSIS — Z800 Family history of malignant neoplasm of digestive organs: Secondary | ICD-10-CM | POA: Diagnosis not present

## 2020-04-29 MED ORDER — SODIUM CHLORIDE 0.9 % IV SOLN: 500.0000 mL | Freq: Once | INTRAVENOUS | Status: DC

## 2020-04-29 NOTE — Patient Instructions (Signed)
YOU HAD AN ENDOSCOPIC PROCEDURE TODAY AT THE [facility]:   Refer to the procedure report that was given to you for any specific questions about what was found during the examination.  If the procedure report does not answer your questions, please call your gastroenterologist to clarify.  If you requested that your care partner not be given the details of your procedure findings, then the procedure report has been included in a sealed envelope for you to review at your convenience later.  YOU SHOULD EXPECT: Some feelings of bloating in the abdomen. Passage of more gas than usual.  Walking can help get rid of the air that was put into your GI tract during the procedure and reduce the bloating. If you had a lower endoscopy (such as a colonoscopy or flexible sigmoidoscopy) you may notice spotting of blood in your stool or on the toilet paper. If you underwent a bowel prep for your procedure, you may not have a normal bowel movement for a few days.  Please Note:  You might notice some irritation and congestion in your nose or some drainage.  This is from the oxygen used during your procedure.  There is no need for concern and it should clear up in a day or so.  SYMPTOMS TO REPORT IMMEDIATELY:   Following lower endoscopy (colonoscopy or flexible sigmoidoscopy):  Excessive amounts of blood in the stool  Significant tenderness or worsening of abdominal pains  Swelling of the abdomen that is new, acute  Fever of 100F or higher  For urgent or emergent issues, a gastroenterologist can be reached at any hour by calling [phone]. Do not use MyChart messaging for urgent concerns.    DIET:  We do recommend a small meal at first, but then you may proceed to your regular diet.  Drink plenty of fluids but you should avoid alcoholic beverages for 24 hours.  ACTIVITY:  You should plan to take it easy for the rest of today and you should NOT DRIVE or use heavy machinery until tomorrow (because  of the sedation medicines used during the test).    FOLLOW UP: Our staff will call the number listed on your records 48-72 hours following your procedure to check on you and address any questions or concerns that you may have regarding the information given to you following your procedure. If we do not reach you, we will leave a message.  We will attempt to reach you two times.  During this call, we will ask if you have developed any symptoms of COVID 19. If you develop any symptoms (ie: fever, flu-like symptoms, shortness of breath, cough etc.) before then, please call [phone].  If you test positive for Covid 19 in the 2 weeks post procedure, please call and report this information to us.    If any biopsies were taken you will be contacted by phone or by letter within the next 1-3 weeks.  Please call us at [phone] if you have not heard about the biopsies in 3 weeks.    SIGNATURES/CONFIDENTIALITY: You and/or your care partner have signed paperwork which will be entered into your electronic medical record.  These signatures attest to the fact that that the information above on your After Visit Summary has been reviewed and is understood.  Full responsibility of the confidentiality of this discharge information lies with you and/or your care-partner. 

## 2020-04-29 NOTE — Progress Notes (Signed)
PT taken to PACU. Monitors in place. VSS. Report given to RN. 

## 2020-04-29 NOTE — Op Note (Signed)
[name] Patient Name: Philip Ryan Procedure Date: 04/29/2020 8:17 AM MRN: [id number] Endoscopist: Milus Banister , MD Age: 65 Referring MD:  Date of Birth: [birth year] Gender: Male Account #: [id number] Procedure:                Colonoscopy Indications:              Screening in patient at increased risk: Colonoscopy                            2013 3 subcentimeter adenomas removed. Colonoscopy                            February 2016 one subcentimeter adenoma removed.                            Family history of 1st-degree relative with                            colorectal cancer (mother, also two second degree                            relatives) Medicines:                Monitored Anesthesia Care Procedure:                Pre-Anesthesia Assessment:                           - Prior to the procedure, a History and Physical                            was performed, and patient medications and                            allergies were reviewed. The patient's tolerance of                            previous anesthesia was also reviewed. The risks                            and benefits of the procedure and the sedation                            options and risks were discussed with the patient.                            All questions were answered, and informed consent                            was obtained. Prior Anticoagulants: The patient has                            taken no previous anticoagulant or antiplatelet  agents. ASA Grade Assessment: II - A patient with                            mild systemic disease. After reviewing the risks                            and benefits, the patient was deemed in                            satisfactory condition to undergo the procedure.                           After obtaining informed consent, the colonoscope                            was passed under direct vision. Throughout the                             procedure, the patient's blood pressure, pulse, and                            oxygen saturations were monitored continuously. The                            Olympus CF-HQ190 [id number]) Colonoscope was                            introduced through the anus and advanced to the the                            cecum, identified by appendiceal orifice and                            ileocecal valve. The colonoscopy was performed                            without difficulty. The patient tolerated the                            procedure well. The quality of the bowel                            preparation was good. The ileocecal valve,                            appendiceal orifice, and rectum were photographed. Scope In: 8:20:48 AM Scope Out: 8:37:31 AM Scope Withdrawal Time: 0 hours 12 minutes 46 seconds  Total Procedure Duration: 0 hours 16 minutes 43 seconds  Findings:                 External and internal hemorrhoids were found. The                            hemorrhoids were small.  The exam was otherwise without abnormality on                            direct and retroflexion views. Complications:            No immediate complications. Estimated blood loss:                            None. Estimated Blood Loss:     Estimated blood loss: none. Impression:               - External and internal hemorrhoids.                           - The examination was otherwise normal on direct                            and retroflexion views.                           - No polyps or cancers. Recommendation:           - Patient has a contact number available for                            emergencies. The signs and symptoms of potential                            delayed complications were discussed with the                            patient. Return to normal activities tomorrow.                            Written discharge instructions were provided to  the                            patient.                           - Resume previous diet.                           - Continue present medications.                           - Repeat colonoscopy in 5 years for screening. Milus Banister, MD 04/29/2020 8:40:47 AM This report has been signed electronically.

## 2020-04-29 NOTE — Progress Notes (Signed)
VS-NS 

## 2020-05-04 ENCOUNTER — Telehealth: Payer: Self-pay

## 2020-05-04 NOTE — Telephone Encounter (Signed)
LVM

## 2020-08-17 ENCOUNTER — Other Ambulatory Visit: Payer: Self-pay | Admitting: Unknown Physician Specialty

## 2020-08-17 DIAGNOSIS — I10 Essential (primary) hypertension: Secondary | ICD-10-CM

## 2020-08-17 NOTE — Telephone Encounter (Signed)
Requested medications are due for refill today.  yes  Requested medications are on the active medications list.  yes  Last refill. 10/09/2019  Future visit scheduled.   yes  Notes to clinic.  Patient is more than 3 months overdue for an office visit.

## 2020-09-17 ENCOUNTER — Encounter: Payer: Self-pay | Admitting: Family Medicine

## 2020-09-17 ENCOUNTER — Ambulatory Visit (INDEPENDENT_AMBULATORY_CARE_PROVIDER_SITE_OTHER): Payer: 59 | Admitting: Family Medicine

## 2020-09-17 ENCOUNTER — Other Ambulatory Visit: Payer: Self-pay

## 2020-09-17 VITALS — BP 133/84 | HR 109 | Temp 98.6°F | Ht 70.0 in | Wt 209.2 lb

## 2020-09-17 DIAGNOSIS — J302 Other seasonal allergic rhinitis: Secondary | ICD-10-CM | POA: Diagnosis not present

## 2020-09-17 DIAGNOSIS — J309 Allergic rhinitis, unspecified: Secondary | ICD-10-CM | POA: Diagnosis not present

## 2020-09-17 MED ORDER — TRIAMCINOLONE ACETONIDE 40 MG/ML IJ SUSP
40.0000 mg | Freq: Once | INTRAMUSCULAR | Status: AC
  Administered 2020-09-17: 40 mg via INTRAMUSCULAR

## 2020-09-17 NOTE — Progress Notes (Signed)
BP 133/84   Pulse (!) 109   Temp 98.6 F (37 C) (Oral)   Ht '5\' 10"'$  (1.778 m)   Wt 209 lb 3.2 oz (94.9 kg)   SpO2 97%   BMI 30.02 kg/m    Subjective:    Patient ID: Philip Dunn., male    DOB: 11-26-1955, 65 y.o.   MRN: [id number]  HPI: Philip Ann. is a 65 y.o. male  Chief Complaint  Patient presents with   seasonal allergies    Patient states he is here for an Allergy shot   ALLERGIES Duration: few days Runny nose: yes  Nasal congestion: yes Nasal itching: no Sneezing: yes Eye swelling, itching or discharge: yes Post nasal drip: no Cough: no Sinus pressure: no  Ear pain: no  Ear pressure: no  Fever: no  Symptoms occur seasonally: yes Symptoms occur perenially: no Satisfied with current treatment: yes Allergist evaluation in past: no Allergen injection immunotherapy: no Recurrent sinus infections: no ENT evaluation in past: no Known environmental allergy: yes Indoor pets: no History of asthma: no Current allergy medications: none Treatments attempted: none  Relevant past medical, surgical, family and social history reviewed and updated as indicated. Interim medical history since our last visit reviewed. Allergies and medications reviewed and updated.  Review of Systems  Constitutional: Negative.   HENT:  Positive for congestion, rhinorrhea and sneezing. Negative for dental problem, drooling, ear discharge, ear pain, facial swelling, hearing loss, mouth sores, nosebleeds, postnasal drip, sinus pressure, sinus pain, sore throat, tinnitus, trouble swallowing and voice change.   Eyes: Negative.   Respiratory: Negative.    Cardiovascular: Negative.   Gastrointestinal: Negative.   Musculoskeletal: Negative.   Neurological: Negative.   Psychiatric/Behavioral: Negative.     Per HPI unless specifically indicated above     Objective:    BP 133/84   Pulse (!) 109   Temp 98.6 F (37 C) (Oral)   Ht '5\' 10"'$  (1.778 m)   Wt 209 lb 3.2 oz (94.9  kg)   SpO2 97%   BMI 30.02 kg/m   Wt Readings from Last 3 Encounters:  09/17/20 209 lb 3.2 oz (94.9 kg)  04/29/20 209 lb (94.8 kg)  03/17/20 209 lb (94.8 kg)    Physical Exam Vitals and nursing note reviewed.  Constitutional:      General: He is not in acute distress.    Appearance: Normal appearance. He is not ill-appearing, toxic-appearing or diaphoretic.  HENT:     Head: Normocephalic and atraumatic.     Right Ear: External ear normal.     Left Ear: External ear normal.     Nose: Nose normal.     Mouth/Throat:     Mouth: Mucous membranes are moist.     Pharynx: Oropharynx is clear.  Eyes:     General: No scleral icterus.       Right eye: No discharge.        Left eye: No discharge.     Extraocular Movements: Extraocular movements intact.     Conjunctiva/sclera: Conjunctivae normal.     Pupils: Pupils are equal, round, and reactive to light.  Cardiovascular:     Rate and Rhythm: Normal rate and regular rhythm.     Pulses: Normal pulses.     Heart sounds: Normal heart sounds. No murmur heard.   No friction rub. No gallop.  Pulmonary:     Effort: Pulmonary effort is normal. No respiratory distress.     Breath sounds: Normal  breath sounds. No stridor. No wheezing, rhonchi or rales.  Chest:     Chest wall: No tenderness.  Musculoskeletal:        General: Normal range of motion.     Cervical back: Normal range of motion and neck supple.  Skin:    General: Skin is warm and dry.     Capillary Refill: Capillary refill takes less than 2 seconds.     Coloration: Skin is not jaundiced or pale.     Findings: No bruising, erythema, lesion or rash.  Neurological:     General: No focal deficit present.     Mental Status: He is alert and oriented to person, place, and time. Mental status is at baseline.  Psychiatric:        Mood and Affect: Mood normal.        Behavior: Behavior normal.        Thought Content: Thought content normal.        Judgment: Judgment normal.     Results for orders placed or performed in visit on 12/26/19  HM DIABETES EYE EXAM  Result Value Ref Range   HM Diabetic Eye Exam No Retinopathy No Retinopathy      Assessment & Plan:   Problem List Items Addressed This Visit       Respiratory   Allergic rhinitis    Declines antihistamine. Has done well with triamcinalone shot previously. Shot given today. Overdue for regular follow up- will get him in ASAP.       Other Visit Diagnoses     Seasonal allergies    -  Primary   Relevant Medications   triamcinolone acetonide (KENALOG-40) injection 40 mg (Completed) (Start on 09/17/2020  8:30 AM)        Follow up plan: Return after 9/22- physical with Dr. Neomia Dear.

## 2020-09-17 NOTE — Assessment & Plan Note (Addendum)
Declines antihistamine. Has done well with triamcinalone shot previously. Shot given today. Overdue for regular follow up- will get him in ASAP.

## 2020-10-01 ENCOUNTER — Other Ambulatory Visit: Payer: Self-pay

## 2020-10-01 ENCOUNTER — Encounter: Payer: Self-pay | Admitting: Family Medicine

## 2020-10-01 ENCOUNTER — Ambulatory Visit (INDEPENDENT_AMBULATORY_CARE_PROVIDER_SITE_OTHER): Payer: 59 | Admitting: Family Medicine

## 2020-10-01 ENCOUNTER — Other Ambulatory Visit: Payer: Self-pay | Admitting: Family Medicine

## 2020-10-01 VITALS — BP 114/67 | HR 94 | Ht 70.0 in | Wt 202.0 lb

## 2020-10-01 DIAGNOSIS — E1169 Type 2 diabetes mellitus with other specified complication: Secondary | ICD-10-CM

## 2020-10-01 DIAGNOSIS — E785 Hyperlipidemia, unspecified: Secondary | ICD-10-CM | POA: Diagnosis not present

## 2020-10-01 DIAGNOSIS — I10 Essential (primary) hypertension: Secondary | ICD-10-CM | POA: Diagnosis not present

## 2020-10-01 DIAGNOSIS — R351 Nocturia: Secondary | ICD-10-CM

## 2020-10-01 DIAGNOSIS — Z0000 Encounter for general adult medical examination without abnormal findings: Secondary | ICD-10-CM

## 2020-10-01 MED ORDER — AMLODIPINE BESYLATE 10 MG PO TABS: 10.0000 mg | ORAL_TABLET | Freq: Every day | ORAL | 3 refills | Status: DC

## 2020-10-01 MED ORDER — XIGDUO XR 10-1000 MG PO TB24: 1.0000 | ORAL_TABLET | Freq: Every day | ORAL | 3 refills | Status: DC

## 2020-10-01 MED ORDER — TELMISARTAN 80 MG PO TABS: 80.0000 mg | ORAL_TABLET | Freq: Every day | ORAL | 3 refills | Status: DC

## 2020-10-01 NOTE — Assessment & Plan Note (Addendum)
Previously elevated 123456 Complications - obesity, hyperlipidemia HTN  Plan:  1. Continue current therapy - Xigduo XR 10-'1000mg'$  daily re order 2. Encourage improved lifestyle - low carb, low sugar diet, reduce portion size, continue improving regular exercise 3. Should check CBG 4. Continue ASA, ARB, not on Statin can review again

## 2020-10-01 NOTE — Assessment & Plan Note (Signed)
Previous cholesterol reviewed Need new lab panel at next visit The 10-year ASCVD risk score (Arnett DK, et al., 2019) is: 22.8% Will review cardiovascular risk and indications at next visit with new results - he is hesitant about statin therapy.  Continue ASA '81mg'$  for primary ASCVD risk reduction - reviewed risks Encourage improved lifestyle - low carb/cholesterol, reduce portion size, continue improving regular exercise

## 2020-10-01 NOTE — Patient Instructions (Addendum)
Thank you for coming to the office today.  Refilled all meds to [facility]  I recommend the new COVID booster Omicron Variant  We can consider Statin cholesterol medication in future.  DUE for FASTING BLOOD WORK (no food or drink after midnight before the lab appointment, only water or coffee without cream/sugar on the morning of)  SCHEDULE "Lab Only" visit in the morning at the clinic for lab draw in 1 MONTH  - Make sure Lab Only appointment is at about 1 week before your next appointment, so that results will be available  For Lab Results, once available within 2-3 days of blood draw, you can can log in to MyChart online to view your results and a brief explanation. Also, we can discuss results at next follow-up visit.   Please schedule a Follow-up Appointment to: Return in about 4 weeks (around 10/29/2020) for 4 weeks fasting lab only then 1 week later Annual Physical.  If you have any other questions or concerns, please feel free to call the office or send a message through [place]. You may also schedule an earlier appointment if necessary.  Additionally, you may be receiving a survey about your experience at our office within a few days to 1 week by e-mail or mail. We value your feedback.  Nobie Putnam, DO [place]

## 2020-10-01 NOTE — Progress Notes (Signed)
Subjective:    Patient ID: Philip Dunn., male    DOB: 15-Oct-1955, 65 y.o.   MRN: [id number]  Philip Warta. is a 65 y.o. male presenting on 10/01/2020 for Establish Care  Previous PCP Dr Jeananne Rama  HPI  Seasonal Allergies / Environmental He has been doing a Triamcinolone '40mg'$  injection once a year, he completed this recently 09/18/20 at [facility]. This was completed. He has done well on this and without allergy medication instead. He takes it for Hay Fever.  CHRONIC DM, Type 2: Reports no concerns CBGs: Not checking regularly Meds: Xigduo XR 10-'1000mg'$  24HR Reports good compliance. Tolerating well w/o side-effects Currently on ARB Denies hypoglycemia, polyuria, visual changes, numbness or tingling.  CHRONIC HTN: Reports controlled BP lately Current Meds - Amlodipine '10mg'$  daily, Telmisartan '80mg'$  daily   Reports good compliance, took meds today. Tolerating well, w/o complaints. Denies CP, dyspnea, HA, edema, dizziness / lightheadedness  HYPERLIPIDEMIA: - Reports no concerns. Last lipid panel 2021, reviewed Not on statin therapy, has declined On ASA 81   Health Maintenance:  COVID19 vaccines, completed Pfizer 04/14/19 and [employer] 05/12/19. No boosters. Will consider Omicron Bivalent vaccine.   Depression screen [facility] 2/9 09/17/2020 10/09/2019 09/20/2018  Decreased Interest 0 0 0  Down, Depressed, Hopeless 0 0 0  PHQ - 2 Score 0 0 0  Altered sleeping 0 0 0  Tired, decreased energy 0 0 0  Change in appetite 0 0 0  Feeling bad or failure about yourself  0 0 0  Trouble concentrating 0 0 1  Moving slowly or fidgety/restless 0 0 0  Suicidal thoughts 0 0 0  PHQ-9 Score 0 0 1    Past Medical History:  Diagnosis Date   Allergy    Hyperlipidemia    Hypertension    Sleep apnea    no CPAP   Past Surgical History:  Procedure Laterality Date   COLONOSCOPY     KNEE ARTHROSCOPY Right    SHOULDER ARTHROSCOPY WITH BICEPS TENDON REPAIR Left 08/29/2016   Procedure: SHOULDER  ARTHROSCOPY WITH BICEPS TENDON REPAIR;  Surgeon: Thornton Park, MD;  Location: [place];  Service: Orthopedics;  Laterality: Left;   SHOULDER ARTHROSCOPY WITH OPEN ROTATOR CUFF REPAIR AND DISTAL CLAVICLE ACROMINECTOMY Left 08/29/2016   Procedure: SHOULDER ARTHROSCOPY WITH OPEN ROTATOR CUFF REPAIR AND DISTAL CLAVICLE ACROMINECTOMY;  Surgeon: Thornton Park, MD;  Location: [place];  Service: Orthopedics;  Laterality: Left;  Sleep Apnea - No CPAP   Social History   Socioeconomic History   Marital status: Married    Spouse name: Not on file   Number of children: Not on file   Years of education: Not on file   Highest education level: Not on file  Occupational History   Not on file  Tobacco Use   Smoking status: Former    Types: Cigarettes    Quit date: 07/28/1974    Years since quitting: 46.2   Smokeless tobacco: Never  Vaping Use   Vaping Use: Never used  Substance and Sexual Activity   Alcohol use: Yes    Alcohol/week: 2.0 standard drinks    Types: 2 Cans of beer per week    Comment: bi weekly use per pt.   Drug use: No   Sexual activity: Not on file  Other Topics Concern   Not on file  Social History Narrative   Not on file   Social Determinants of Health   Financial Resource Strain: Not on file  Food Insecurity:  Not on file  Transportation Needs: Not on file  Physical Activity: Not on file  Stress: Not on file  Social Connections: Not on file  Intimate Partner Violence: Not on file   Family History  Problem Relation Age of Onset   Diabetes Mother    Colon cancer Mother    CAD Maternal Grandfather    Heart attack Maternal Grandfather    Cancer Maternal Grandfather    Colon cancer Maternal Grandfather    Esophageal cancer Neg Hx    Rectal cancer Neg Hx    Stomach cancer Neg Hx    Current Outpatient Medications on File Prior to Visit  Medication Sig   aspirin EC 81 MG tablet Take 81 mg by mouth daily.   Multiple Vitamin (MULTIVITAMIN)  tablet Take 1 tablet by mouth daily.   No current facility-administered medications on file prior to visit.    Review of Systems Per HPI unless specifically indicated above      Objective:    BP 114/67   Pulse 94   Ht '5\' 10"'$  (1.778 m)   Wt 202 lb (91.6 kg)   SpO2 97%   BMI 28.98 kg/m   Wt Readings from Last 3 Encounters:  10/01/20 202 lb (91.6 kg)  09/17/20 209 lb 3.2 oz (94.9 kg)  04/29/20 209 lb (94.8 kg)    Physical Exam Vitals and nursing note reviewed.  Constitutional:      General: He is not in acute distress.    Appearance: Normal appearance. He is well-developed. He is not diaphoretic.     Comments: Well-appearing, comfortable, cooperative  HENT:     Head: Normocephalic and atraumatic.  Eyes:     General:        Right eye: No discharge.        Left eye: No discharge.     Conjunctiva/sclera: Conjunctivae normal.  Cardiovascular:     Rate and Rhythm: Normal rate.  Pulmonary:     Effort: Pulmonary effort is normal.  Skin:    General: Skin is warm and dry.     Findings: No erythema or rash.  Neurological:     Mental Status: He is alert and oriented to person, place, and time.  Psychiatric:        Mood and Affect: Mood normal.        Behavior: Behavior normal.        Thought Content: Thought content normal.     Comments: Well groomed, good eye contact, normal speech and thoughts     Results for orders placed or performed in visit on 12/26/19  HM DIABETES EYE EXAM  Result Value Ref Range   HM Diabetic Eye Exam No Retinopathy No Retinopathy      Assessment & Plan:   Problem List Items Addressed This Visit     Type 2 diabetes mellitus with other specified complication ([place]) - Primary    Previously elevated [id number] Complications - obesity, hyperlipidemia HTN  Plan:  1. Continue current therapy - Xigduo XR 10-'1000mg'$  daily re order 2. Encourage improved lifestyle - low carb, low sugar diet, reduce portion size, continue improving regular exercise 3.  Should check CBG 4. Continue ASA, ARB, not on Statin can review again      Relevant Medications   telmisartan (MICARDIS) 80 MG tablet   XIGDUO XR 10-998 MG TB24   Hypertension    Well-controlled HTN - Home BP readings reviewed  No known complications    Plan:  1. Continue current BP regimen  Amlodipine '10mg'$  and Telmisartan '80mg'$  daily 2. Encourage improved lifestyle - low sodium diet, regular exercise 3. Continue monitor BP outside office, bring readings to next visit, if persistently >140/90 or new symptoms notify office sooner      Relevant Medications   amLODipine (NORVASC) 10 MG tablet   telmisartan (MICARDIS) 80 MG tablet   Hyperlipidemia due to type 2 diabetes mellitus (HCC)    Previous cholesterol reviewed Need new lab panel at next visit The 10-year ASCVD risk score (Arnett DK, et al., 2019) is: 22.8% Will review cardiovascular risk and indications at next visit with new results - he is hesitant about statin therapy.  Continue ASA '81mg'$  for primary ASCVD risk reduction - reviewed risks Encourage improved lifestyle - low carb/cholesterol, reduce portion size, continue improving regular exercise      Relevant Medications   amLODipine (NORVASC) 10 MG tablet   telmisartan (MICARDIS) 80 MG tablet   XIGDUO XR 10-998 MG TB24      Meds ordered this encounter  Medications   amLODipine (NORVASC) 10 MG tablet    Sig: Take 1 tablet (10 mg total) by mouth daily.    Dispense:  90 tablet    Refill:  3   telmisartan (MICARDIS) 80 MG tablet    Sig: Take 1 tablet (80 mg total) by mouth daily.    Dispense:  90 tablet    Refill:  3   XIGDUO XR 10-998 MG TB24    Sig: Take 1 tablet by mouth daily.    Dispense:  90 tablet    Refill:  3     Follow up plan: Return in about 4 weeks (around 10/29/2020) for 4 weeks fasting lab only then 1 week later Annual Physical.  Future labs CMET CBC Lipid A1c PSA   Nobie Putnam, DO [facility] 10/01/2020, 10:26 AM

## 2020-10-01 NOTE — Assessment & Plan Note (Signed)
Well-controlled HTN - Home BP readings reviewed  No known complications     Plan:  1. Continue current BP regimen Amlodipine 10mg and Telmisartan 80mg daily 2. Encourage improved lifestyle - low sodium diet, regular exercise 3. Continue monitor BP outside office, bring readings to next visit, if persistently >140/90 or new symptoms notify office sooner 

## 2020-11-23 ENCOUNTER — Other Ambulatory Visit: Payer: Self-pay

## 2020-11-23 DIAGNOSIS — E785 Hyperlipidemia, unspecified: Secondary | ICD-10-CM

## 2020-11-23 DIAGNOSIS — I10 Essential (primary) hypertension: Secondary | ICD-10-CM

## 2020-11-23 DIAGNOSIS — R351 Nocturia: Secondary | ICD-10-CM

## 2020-11-23 DIAGNOSIS — Z0000 Encounter for general adult medical examination without abnormal findings: Secondary | ICD-10-CM

## 2020-11-23 DIAGNOSIS — E1169 Type 2 diabetes mellitus with other specified complication: Secondary | ICD-10-CM

## 2020-11-24 ENCOUNTER — Other Ambulatory Visit: Payer: 59

## 2020-11-25 LAB — COMPLETE METABOLIC PANEL WITH GFR
AG Ratio: 1.5 (calc) (ref 1.0–2.5)
ALT: 20 U/L (ref 9–46)
AST: 17 U/L (ref 10–35)
Albumin: 4.7 g/dL (ref 3.6–5.1)
Alkaline phosphatase (APISO): 71 U/L (ref 35–144)
BUN: 13 mg/dL (ref 7–25)
CO2: 28 mmol/L (ref 20–32)
Calcium: 10.1 mg/dL (ref 8.6–10.3)
Chloride: 101 mmol/L (ref 98–110)
Creat: 0.71 mg/dL (ref 0.70–1.35)
Globulin: 3.1 g/dL (calc) (ref 1.9–3.7)
Glucose, Bld: 207 mg/dL — ABNORMAL HIGH (ref 65–99)
Potassium: 4.7 mmol/L (ref 3.5–5.3)
Sodium: 139 mmol/L (ref 135–146)
Total Bilirubin: 1.3 mg/dL — ABNORMAL HIGH (ref 0.2–1.2)
Total Protein: 7.8 g/dL (ref 6.1–8.1)
eGFR: 102 mL/min/{1.73_m2} (ref >=60)

## 2020-11-25 LAB — CBC WITH DIFFERENTIAL/PLATELET
Absolute Monocytes: 240 cells/uL (ref 200–950)
Basophils Absolute: 61 cells/uL (ref 0–200)
Basophils Relative: 1.3 %
Eosinophils Absolute: 197 cells/uL (ref 15–500)
Eosinophils Relative: 4.2 %
HCT: 52.3 % — ABNORMAL HIGH (ref 38.5–50.0)
Hemoglobin: 16.9 g/dL (ref 13.2–17.1)
Lymphs Abs: 1283 cells/uL (ref 850–3900)
MCH: 29.4 pg (ref 27.0–33.0)
MCHC: 32.3 g/dL (ref 32.0–36.0)
MCV: 91.1 fL (ref 80.0–100.0)
MPV: 10.2 fL (ref 7.5–12.5)
Monocytes Relative: 5.1 %
Neutro Abs: 2919 cells/uL (ref 1500–7800)
Neutrophils Relative %: 62.1 %
Platelets: 188 10*3/uL (ref 140–400)
RBC: 5.74 10*6/uL (ref 4.20–5.80)
RDW: 12.5 % (ref 11.0–15.0)
Total Lymphocyte: 27.3 %
WBC: 4.7 10*3/uL (ref 3.8–10.8)

## 2020-11-25 LAB — HEMOGLOBIN A1C
Hgb A1c MFr Bld: 8.9 % of total Hgb — ABNORMAL HIGH (ref <5.7)
Mean Plasma Glucose: 209 mg/dL
eAG (mmol/L): 11.6 mmol/L

## 2020-11-25 LAB — LIPID PANEL
Cholesterol: 265 mg/dL — ABNORMAL HIGH (ref <200)
HDL: 60 mg/dL (ref >=40)
LDL Cholesterol (Calc): 166 mg/dL (calc) — ABNORMAL HIGH
Non-HDL Cholesterol (Calc): 205 mg/dL (calc) — ABNORMAL HIGH (ref <130)
Total CHOL/HDL Ratio: 4.4 (calc) (ref <5.0)
Triglycerides: 220 mg/dL — ABNORMAL HIGH (ref <150)

## 2020-11-25 LAB — PSA: PSA: 0.76 ng/mL (ref <=4.00)

## 2020-12-01 ENCOUNTER — Ambulatory Visit (INDEPENDENT_AMBULATORY_CARE_PROVIDER_SITE_OTHER): Payer: 59 | Admitting: Family Medicine

## 2020-12-01 ENCOUNTER — Encounter: Payer: Self-pay | Admitting: Family Medicine

## 2020-12-01 ENCOUNTER — Other Ambulatory Visit: Payer: Self-pay

## 2020-12-01 VITALS — BP 136/81 | HR 97 | Ht 70.0 in | Wt 201.8 lb

## 2020-12-01 DIAGNOSIS — E1169 Type 2 diabetes mellitus with other specified complication: Secondary | ICD-10-CM

## 2020-12-01 DIAGNOSIS — Z23 Encounter for immunization: Secondary | ICD-10-CM

## 2020-12-01 DIAGNOSIS — I10 Essential (primary) hypertension: Secondary | ICD-10-CM

## 2020-12-01 DIAGNOSIS — E785 Hyperlipidemia, unspecified: Secondary | ICD-10-CM

## 2020-12-01 DIAGNOSIS — Z0000 Encounter for general adult medical examination without abnormal findings: Secondary | ICD-10-CM | POA: Diagnosis not present

## 2020-12-01 NOTE — Progress Notes (Signed)
Subjective:    Patient ID: Philip Ryan., male    DOB: 01-13-1956, 65 y.o.   MRN: [id number]  [name]. is a 65 y.o. male presenting on 12/01/2020 for Annual Exam   HPI   Here for Annual Physical and lab Review.   Health Maintenance:  Seasonal Allergies / Environmental He has been doing a Triamcinolone 51m injection once a year, [facility] This [facility]. He has done well on this and without allergy medication instead. He takes it for Hay Fever.   CHRONIC DM, Type 2: Reports concerns with not adhering to lifestyle diet for past 2 years. Ate more of what he wanted to when he could. Elevated A1c 8.9 CBGs: Not checking regularly Meds: Xigduo XR 10-10036m24HR Reports good compliance. Tolerating well w/o side-effects Currently on ARB Denies hypoglycemia, polyuria, visual changes, numbness or tingling.   CHRONIC HTN: Reports controlled BP Current Meds - Amlodipine 1034maily, Telmisartan 101m73mily   Reports good compliance, took meds today. Tolerating well, w/o complaints. Denies CP, dyspnea, HA, edema, dizziness / lightheadedness   HYPERLIPIDEMIA: - Reports no concerns. Last lipid panel 2022, reviewed with elevated LDL >160 Not on statin therapy, has declined On ASA 81     Health Maintenance:  Due for Flu Shot, will receive today    COVID19 vaccines, completed [place]/21 and Pf[employer]5/21. No boosters. Will consider Omicron Bivalent vaccine.  Colonoscopy completed 04/2020, next repeat 5 yr, has fam history Colon CA  PSA 0.76 (11/2020) negative.   Depression screen [facility] 09/17/2020 10/09/2019 09/20/2018  Decreased Interest 0 0 0  Down, Depressed, Hopeless 0 0 0  PHQ - 2 Score 0 0 0  Altered sleeping 0 0 0  Tired, decreased energy 0 0 0  Change in appetite 0 0 0  Feeling bad or failure about yourself  0 0 0  Trouble concentrating 0 0 1  Moving slowly or fidgety/restless 0 0 0  Suicidal thoughts 0 0 0   PHQ-9 Score 0 0 1    Past Medical History:  Diagnosis Date   Allergy    Hyperlipidemia    Hypertension    Sleep apnea    no CPAP   Past Surgical History:  Procedure Laterality Date   COLONOSCOPY     KNEE ARTHROSCOPY Right    SHOULDER ARTHROSCOPY WITH BICEPS TENDON REPAIR Left 08/29/2016   Procedure: SHOULDER ARTHROSCOPY WITH BICEPS TENDON REPAIR;  Surgeon: KrasThornton Park;  [facility];  Laterality: Left;   SHOULDER ARTHROSCOPY WITH OPEN ROTATOR CUFF REPAIR AND DISTAL CLAVICLE ACROMINECTOMY Left 08/29/2016   Procedure: SHOULDER ARTHROSCOPY WITH OPEN ROTATOR CUFF REPAIR AND DISTAL CLAVICLE ACROMINECTOMY;  Surgeon: KrasThornton Park;  [place];  Laterality: Left;  Sleep Apnea - No CPAP   Social History   Socioeconomic History   Marital status: Married    Spouse name: Not on file   Number of children: Not on file   Years of education: Not on file   Highest education level: Not on file  Occupational History   Not on file  Tobacco Use   Smoking status: Former    Types: Cigarettes    Quit date: 07/28/1974    Years since quitting: 46.3   Smokeless tobacco: Never  Vaping Use   Vaping Use: Never used  Substance and Sexual Activity   Alcohol use: Yes    Alcohol/week: 2.0 standard  drinks    Types: 2 Cans of beer per week    Comment: bi weekly use per pt.   Drug use: No   Sexual activity: Not on file  Other Topics Concern   Not on file  Social History Narrative   Not on file   Social Determinants of Health   Financial Resource Strain: Not on file  Food Insecurity: Not on file  Transportation Needs: Not on file  Physical Activity: Not on file  Stress: Not on file  Social Connections: Not on file  Intimate Partner Violence: Not on file   Family History  Problem Relation Age of Onset   Diabetes Mother    Colon cancer Mother    CAD Maternal Grandfather    Heart attack Maternal  Grandfather    Cancer Maternal Grandfather    Colon cancer Maternal Grandfather    Esophageal cancer Neg Hx    Rectal cancer Neg Hx    Stomach cancer Neg Hx    Current Outpatient Medications on File Prior to Visit  Medication Sig   amLODipine (NORVASC) 10 MG tablet Take 1 tablet (10 mg total) by mouth daily.   aspirin EC 81 MG tablet Take 81 mg by mouth daily.   Multiple Vitamin (MULTIVITAMIN) tablet Take 1 tablet by mouth daily.   telmisartan (MICARDIS) 80 MG tablet Take 1 tablet (80 mg total) by mouth daily.   XIGDUO XR 10-998 MG TB24 Take 1 tablet by mouth daily.   No current facility-administered medications on file prior to visit.    Review of Systems  Constitutional:  Negative for activity change, appetite change, chills, diaphoresis, fatigue and fever.  HENT:  Negative for congestion and hearing loss.   Eyes:  Negative for visual disturbance.  Respiratory:  Negative for cough, chest tightness, shortness of breath and wheezing.   Cardiovascular:  Negative for chest pain, palpitations and leg swelling.  Gastrointestinal:  Negative for abdominal pain, constipation, diarrhea, nausea and vomiting.  Genitourinary:  Negative for dysuria, frequency and hematuria.  Musculoskeletal:  Negative for arthralgias and neck pain.  Skin:  Negative for rash.  Neurological:  Negative for dizziness, weakness, light-headedness, numbness and headaches.  Hematological:  Negative for adenopathy.  Psychiatric/Behavioral:  Negative for behavioral problems, dysphoric mood and sleep disturbance.   Per HPI unless specifically indicated above     Objective:    BP 136/81   Pulse 97   Ht _0  (1.778 m)   Wt 201 lb 12.8 oz (91.5 kg)   SpO2 100%   BMI 28.96 kg/m   Wt Readings from Last 3 Encounters:  12/01/20 201 lb 12.8 oz (91.5 kg)  10/01/20 202 lb (91.6 kg)  09/17/20 209 lb 3.2 oz (94.9 kg)    Physical Exam Vitals and nursing note reviewed.  Constitutional:      General: He is not in  acute distress.    Appearance: He is well-developed. He is not diaphoretic.     Comments: Well-appearing, comfortable, cooperative  HENT:     Head: Normocephalic and atraumatic.  Eyes:     General:        Right eye: No discharge.        Left eye: No discharge.     Conjunctiva/sclera: Conjunctivae normal.     Pupils: Pupils are equal, round, and reactive to light.  Neck:     Thyroid: No thyromegaly.  Cardiovascular:     Rate and Rhythm: Normal rate and regular rhythm.     Pulses: Normal pulses.  Heart sounds: Normal heart sounds. No murmur heard. Pulmonary:     Effort: Pulmonary effort is normal. No respiratory distress.     Breath sounds: Normal breath sounds. No wheezing or rales.  Abdominal:     General: Bowel sounds are normal. There is no distension.     Palpations: Abdomen is soft. There is no mass.     Tenderness: There is no abdominal tenderness.  Musculoskeletal:        General: No tenderness. Normal range of motion.     Cervical back: Normal range of motion and neck supple.     Comments: Upper / Lower Extremities: - Normal muscle tone, strength bilateral upper extremities 5/5, lower extremities 5/5  Lymphadenopathy:     Cervical: No cervical adenopathy.  Skin:    General: Skin is warm and dry.     Findings: No erythema or rash.     Comments: 1 hypopigmented skin tag R cheek, and multiple pigmented skin tags Left clavicular neck area  Neurological:     Mental Status: He is alert and oriented to person, place, and time.     Comments: Distal sensation intact to light touch all extremities  Psychiatric:        Mood and Affect: Mood normal.        Behavior: Behavior normal.        Thought Content: Thought content normal.     Comments: Well groomed, good eye contact, normal speech and thoughts    Diabetic Foot Exam - Simple   Simple Foot Form Diabetic Foot exam was performed with the following findings: Yes 12/01/2020  8:22 AM  Visual Inspection See comments:  Yes Sensation Testing Intact to touch and monofilament testing bilaterally: Yes Pulse Check Posterior Tibialis and Dorsalis pulse intact bilaterally: Yes Comments Left 2nd toe with joint deformity bulkiness, alignment is good, has no significant callus or ulceration. Intact monofilament.      Results for orders placed or performed in visit on 11/23/20  PSA  Result Value Ref Range   PSA 0.76 < OR = 4.00 ng/mL  Hemoglobin A1c  Result Value Ref Range   Hgb A1c MFr Bld 8.9 (H) <5.7 % of total Hgb   Mean Plasma Glucose 209 mg/dL   eAG (mmol/L) 11.6 mmol/L  CBC with Differential/Platelet  Result Value Ref Range   WBC 4.7 3.8 - 10.8 Thousand/uL   RBC 5.74 4.20 - 5.80 Million/uL   Hemoglobin 16.9 13.2 - 17.1 g/dL   HCT 52.3 (H) 38.5 - 50.0 %   MCV 91.1 80.0 - 100.0 fL   MCH 29.4 27.0 - 33.0 pg   MCHC 32.3 32.0 - 36.0 g/dL   RDW 12.5 11.0 - 15.0 %   Platelets 188 140 - 400 Thousand/uL   MPV 10.2 7.5 - 12.5 fL   Neutro Abs 2,919 1,500 - 7,800 cells/uL   Lymphs Abs 1,283 850 - 3,900 cells/uL   Absolute Monocytes 240 200 - 950 cells/uL   Eosinophils Absolute 197 15 - 500 cells/uL   Basophils Absolute 61 0 - 200 cells/uL   Neutrophils Relative % 62.1 %   Total Lymphocyte 27.3 %   Monocytes Relative 5.1 %   Eosinophils Relative 4.2 %   Basophils Relative 1.3 %  Lipid panel  Result Value Ref Range   Cholesterol 265 (H) <200 mg/dL   HDL 60 > OR = 40 mg/dL   Triglycerides 220 (H) <150 mg/dL   LDL Cholesterol (Calc) 166 (H) mg/dL (calc)   Total CHOL/HDL  Ratio 4.4 <5.0 (calc)   Non-HDL Cholesterol (Calc) 205 (H) <130 mg/dL (calc)  COMPLETE METABOLIC PANEL WITH GFR  Result Value Ref Range   Glucose, Bld 207 (H) 65 - 99 mg/dL   BUN 13 7 - 25 mg/dL   Creat 0.71 0.70 - 1.35 mg/dL   eGFR 102 > OR = 60 mL/min/1.79m   BUN/Creatinine Ratio NOT APPLICABLE 6 - 22 (calc)   Sodium 139 135 - 146 mmol/L   Potassium 4.7 3.5 - 5.3 mmol/L   Chloride 101 98 - 110 mmol/L   CO2 28 20 - 32  mmol/L   Calcium 10.1 8.6 - 10.3 mg/dL   Total Protein 7.8 6.1 - 8.1 g/dL   Albumin 4.7 3.6 - 5.1 g/dL   Globulin 3.1 1.9 - 3.7 g/dL (calc)   AG Ratio 1.5 1.0 - 2.5 (calc)   Total Bilirubin 1.3 (H) 0.2 - 1.2 mg/dL   Alkaline phosphatase (APISO) 71 35 - 144 U/L   AST 17 10 - 35 U/L   ALT 20 9 - 46 U/L      Assessment & Plan:   Problem List Items Addressed This Visit     Type 2 diabetes mellitus with other specified complication (HCC)    AL9J8.9 on last lab Complications - obesity, hyperlipidemia HTN  Plan:  1. Continue current therapy - Xigduo XR 10-10019mdaily re order 2. Encourage improved lifestyle - low carb, low sugar diet, reduce portion size, continue improving regular exercise 3. Should check CBG 4. Continue ASA, ARB, not on Statin can review again      Hypertension    Well-controlled HTN - Home BP readings reviewed  No known complications    Plan:  1. Continue current BP regimen Amlodipine 1047mnd Telmisartan 43m9mily 2. Encourage improved lifestyle - low sodium diet, regular exercise 3. Continue monitor BP outside office, bring readings to next visit, if persistently >140/90 or new symptoms notify office sooner      Hyperlipidemia due to type 2 diabetes mellitus (HCC)[place] Elevated LDL >160 The 10-year ASCVD risk score (Arnett DK, et al., 2019) is: 29.7% Will review cardiovascular risk and indications at next visit with new results - he is hesitant about statin therapy.  Continue ASA 81mg17m primary ASCVD risk reduction - reviewed risks Encourage improved lifestyle - low carb/cholesterol, reduce portion size, continue improving regular exercise  Repeat lipid in 6 months, reconsider med at that time if indicated      Other Visit Diagnoses     Annual physical exam    -  Primary   Needs flu shot       Relevant Orders   Flu Vaccine QUAD 33mo+I78moluarix, Fluzone & Alfiuria Quad PF) (Completed)       Updated Health Maintenance information Flu Shot  today Declines COVID19 booster Reviewed recent lab results with patient Encouraged improvement to lifestyle with diet and exercise Goal of weight loss   No orders of the defined types were placed in this encounter.     Follow up plan: Return in about 6 months (around 05/31/2021) for 6 month fasting lab only then 1 week later Follow-up DM, HLD.  AlexanNobie [facility] 12/01/2020, 8:11 AM

## 2020-12-01 NOTE — Patient Instructions (Addendum)
Thank you for coming to the office today.  Try to focus on improving diet lifestyle, weight and blood sugar over the next 6 months.  Goal to improve A1c and cholesterol LDL naturally with diet / lifestyle.  For now, no change to medicines.  Check into new insurance find cost and coverage of the following   1. Ozempic (Semaglutide injection) - start 0.25mg  weekly for 4 weeks then increase to 0.5mg  weekly   2. Trulicity (Dulaglutide) - once weekly - this is very good one, usually one of my top choices as well, two doses, 0.75 (likely we would start) and 1.5 max dose. We can use coupon card here too   3. Mounjaro (NEWEST ONE) - Best results. Weekly shot.  Rybelsus (pill - oral semaglutide or ozempic) - once daily, taken first thing in morning without other meds Ozempic (injection once week Trulicity (injection once week Mounjaro (injection once a week)  We can get these newer meds at low cost if you are interested.  3 benefits - 1 significantly reduced A1c sugar, and may be able to reduce or stop metformin in future - 2 reduced appetite and weight loss with good results - 3 cardiovascular risk reduction, less likely to have heart attack/stroke    Please schedule a Follow-up Appointment to: Return in about 6 months (around 05/31/2021) for 6 month fasting lab only then 1 week later Follow-up DM, HLD.  If you have any other questions or concerns, please feel free to call the office or send a message through [place]. You may also schedule an earlier appointment if necessary.  Additionally, you may be receiving a survey about your experience at our office within a few days to 1 week by e-mail or mail. We value your feedback.  Nobie Putnam, DO [place]

## 2020-12-01 NOTE — Assessment & Plan Note (Signed)
Well-controlled HTN - Home BP readings reviewed  No known complications     Plan:  1. Continue current BP regimen Amlodipine 10mg and Telmisartan 80mg daily 2. Encourage improved lifestyle - low sodium diet, regular exercise 3. Continue monitor BP outside office, bring readings to next visit, if persistently >140/90 or new symptoms notify office sooner 

## 2020-12-01 NOTE — Assessment & Plan Note (Signed)
Elevated LDL >160 The 10-year ASCVD risk score (Arnett DK, et al., 2019) is: 29.7% Will review cardiovascular risk and indications at next visit with new results - he is hesitant about statin therapy.  Continue ASA 81mg  for primary ASCVD risk reduction - reviewed risks Encourage improved lifestyle - low carb/cholesterol, reduce portion size, continue improving regular exercise  Repeat lipid in 6 months, reconsider med at that time if indicated

## 2020-12-01 NOTE — Assessment & Plan Note (Signed)
A1c 8.9 on last lab Complications - obesity, hyperlipidemia HTN  Plan:  1. Continue current therapy - Xigduo XR 10-1000mg  daily re order 2. Encourage improved lifestyle - low carb, low sugar diet, reduce portion size, continue improving regular exercise 3. Should check CBG 4. Continue ASA, ARB, not on Statin can review again

## 2020-12-23 LAB — HM DIABETES EYE EXAM

## 2021-03-17 DIAGNOSIS — M9904 Segmental and somatic dysfunction of sacral region: Secondary | ICD-10-CM | POA: Diagnosis not present

## 2021-03-17 DIAGNOSIS — M9903 Segmental and somatic dysfunction of lumbar region: Secondary | ICD-10-CM | POA: Diagnosis not present

## 2021-03-17 DIAGNOSIS — M9905 Segmental and somatic dysfunction of pelvic region: Secondary | ICD-10-CM | POA: Diagnosis not present

## 2021-03-17 DIAGNOSIS — M545 Low back pain, unspecified: Secondary | ICD-10-CM | POA: Diagnosis not present

## 2021-04-21 DIAGNOSIS — M9904 Segmental and somatic dysfunction of sacral region: Secondary | ICD-10-CM | POA: Diagnosis not present

## 2021-04-21 DIAGNOSIS — M9903 Segmental and somatic dysfunction of lumbar region: Secondary | ICD-10-CM | POA: Diagnosis not present

## 2021-04-21 DIAGNOSIS — M9905 Segmental and somatic dysfunction of pelvic region: Secondary | ICD-10-CM | POA: Diagnosis not present

## 2021-04-21 DIAGNOSIS — M545 Low back pain, unspecified: Secondary | ICD-10-CM | POA: Diagnosis not present

## 2021-05-19 DIAGNOSIS — M9904 Segmental and somatic dysfunction of sacral region: Secondary | ICD-10-CM | POA: Diagnosis not present

## 2021-05-19 DIAGNOSIS — M9903 Segmental and somatic dysfunction of lumbar region: Secondary | ICD-10-CM | POA: Diagnosis not present

## 2021-05-19 DIAGNOSIS — M545 Low back pain, unspecified: Secondary | ICD-10-CM | POA: Diagnosis not present

## 2021-05-19 DIAGNOSIS — M9905 Segmental and somatic dysfunction of pelvic region: Secondary | ICD-10-CM | POA: Diagnosis not present

## 2021-05-24 ENCOUNTER — Other Ambulatory Visit: Payer: Self-pay

## 2021-05-24 DIAGNOSIS — E1169 Type 2 diabetes mellitus with other specified complication: Secondary | ICD-10-CM

## 2021-05-25 ENCOUNTER — Other Ambulatory Visit: Payer: 59

## 2021-05-25 DIAGNOSIS — E1169 Type 2 diabetes mellitus with other specified complication: Secondary | ICD-10-CM | POA: Diagnosis not present

## 2021-05-25 DIAGNOSIS — E785 Hyperlipidemia, unspecified: Secondary | ICD-10-CM | POA: Diagnosis not present

## 2021-05-26 LAB — LIPID PANEL
Cholesterol: 242 mg/dL — ABNORMAL HIGH (ref <200)
HDL: 47 mg/dL (ref >=40)
LDL Cholesterol (Calc): 143 mg/dL (calc) — ABNORMAL HIGH
Non-HDL Cholesterol (Calc): 195 mg/dL (calc) — ABNORMAL HIGH (ref <130)
Total CHOL/HDL Ratio: 5.1 (calc) — ABNORMAL HIGH (ref <5.0)
Triglycerides: 340 mg/dL — ABNORMAL HIGH (ref <150)

## 2021-05-26 LAB — HEMOGLOBIN A1C
Hgb A1c MFr Bld: 9.9 % of total Hgb — ABNORMAL HIGH (ref <5.7)
Mean Plasma Glucose: 237 mg/dL
eAG (mmol/L): 13.2 mmol/L

## 2021-06-01 ENCOUNTER — Ambulatory Visit (INDEPENDENT_AMBULATORY_CARE_PROVIDER_SITE_OTHER): Payer: PPO | Admitting: Family Medicine

## 2021-06-01 ENCOUNTER — Encounter: Payer: Self-pay | Admitting: Family Medicine

## 2021-06-01 VITALS — BP 126/78 | HR 96 | Ht 70.0 in | Wt 206.0 lb

## 2021-06-01 DIAGNOSIS — E785 Hyperlipidemia, unspecified: Secondary | ICD-10-CM

## 2021-06-01 DIAGNOSIS — E1169 Type 2 diabetes mellitus with other specified complication: Secondary | ICD-10-CM

## 2021-06-01 DIAGNOSIS — M1711 Unilateral primary osteoarthritis, right knee: Secondary | ICD-10-CM | POA: Insufficient documentation

## 2021-06-01 DIAGNOSIS — I10 Essential (primary) hypertension: Secondary | ICD-10-CM | POA: Diagnosis not present

## 2021-06-01 NOTE — Assessment & Plan Note (Signed)
Well-controlled HTN - Home BP readings reviewed  No known complications     Plan:  1. Continue current BP regimen Amlodipine 10mg and Telmisartan 80mg daily 2. Encourage improved lifestyle - low sodium diet, regular exercise 3. Continue monitor BP outside office, bring readings to next visit, if persistently >140/90 or new symptoms notify office sooner 

## 2021-06-01 NOTE — Patient Instructions (Addendum)
Thank you for coming to the office today. ? ?Send me list of names for Orthopedic Surgeon. We can refer for the Right knee when ready. ? ?Main ones locally - Dr Marry Guan and Dr Rudene Christians locally are good option. ? ?Future Pneumonia shot ? ?Future consideration of the low dose Statin therapy for cholesterol ? ?Recent Labs  ?  11/24/20 ?5329 05/25/21 ?0756  ?HGBA1C 8.9* 9.9*  ? ?Call insurance find cost and coverage of the following - check the following: ?- Drug Tier, Preferred List, On Formulary ?- All will require a "Prior Authorization" from [place] first, before you can find out the cost ?- Find out if there is "Step Therapy" (other medicines required before you can try these) ? ?Once you pick the one you want to try, let me know - we can get a sample ready IF we have it in stock. Then try it - and before running out of medicine, contact me back to order your Rx so we have time to get it processed. ? ? ?For Pre-Diabetes / Diabetes ?  ?1. Ozempic (Semaglutide injection) - start 0.'25mg'$  weekly for 4 weeks then increase to 0.'5mg'$  weekly, sample is 6 doses, re-use the same pen until empty, new needle each dose. ?  ?2. Trulicity (Dulaglutide) - once weekly 0.75 (likely we would start) and 1.5 max dose, sample is 2 doses, 1 dose per pen, each pen is a one time use, no visible needle, it is an auto-injector. ? ?3. Mounjaro - newest one available. Once week shot - this has the BEST data so far, if we can get it covered. ? ?4. Rybelsus (pill - oral semaglutide or ozempic) - once daily, taken first thing in morning without other meds ? ? ? ?Please schedule a Follow-up Appointment to: Return in about 3 months (around 09/01/2021) for 3 month follow-up DM A1c. ? ?If you have any other questions or concerns, please feel free to call the office or send a message through [place]. You may also schedule an earlier appointment if necessary. ? ?Additionally, you may be receiving a survey about your experience at our office within a few days to 1  week by e-mail or mail. We value your feedback. ? ?Nobie Putnam, DO ?[place] ?

## 2021-06-01 NOTE — Assessment & Plan Note (Signed)
Chronic problem w/ OA DJD R knee ?Prior history arthroscopic surgery years ago ?He is interested in return to Orthopedic ?Will send [place] list of preferred providers and we can send referral. ?

## 2021-06-01 NOTE — Progress Notes (Signed)
? ?Subjective:  ? ? Patient ID: Philip Ryan., male    DOB: [birth year], 66 y.o.   MRN: [id number] ? ?Philip Ryan. is a 66 y.o. male presenting on 06/01/2021 for Diabetes and Hypertension ? ? ?HPI ? ?CHRONIC DM, Type 2: ?Reports concerns with not adhering to lifestyle diet for past 2 years ?Elevated A1c lately, admits inc sweets candy ?CBGs: Not checking regularly ?Meds: Xigduo XR 10-'1000mg'$  24HR ?Reports good compliance. Tolerating well w/o side-effects ?Currently on ARB ?Due for DM Eye exam - [place] ?Denies hypoglycemia, polyuria, visual changes, numbness or tingling. ?  ?CHRONIC HTN: ?Reports controlled BP ?Current Meds - Amlodipine '10mg'$  daily, Telmisartan '80mg'$  daily   ?Reports good compliance, took meds today. Tolerating well, w/o complaints. ?Denies CP, dyspnea, HA, edema, dizziness / lightheadedness ?  ?HYPERLIPIDEMIA: ?- Reports no concerns. Last lipid panel 2023, reviewed with elevated LDL >140 ?Not on statin therapy, has declined. He may reconsider. ?On ASA 81 but had stopped recently will restart. ? ?Osteoarthritis Right Knee ?He has significant bone spurs. Prior Ortho, Dr Earnestine Leys - Arthroscopic knee surgery 30-40 years ago. ? ? ?Health Maintenance: ? ?Due for pneumonia shot, age 65+ ? ? ?  09/17/2020  ?  8:04 AM 10/09/2019  ?  9:21 AM 09/20/2018  ?  9:54 AM  ?Depression screen PHQ 2/9  ?Decreased Interest 0 0 0  ?Down, Depressed, Hopeless 0 0 0  ?PHQ - 2 Score 0 0 0  ?Altered sleeping 0 0 0  ?Tired, decreased energy 0 0 0  ?Change in appetite 0 0 0  ?Feeling bad or failure about yourself  0 0 0  ?Trouble concentrating 0 0 1  ?Moving slowly or fidgety/restless 0 0 0  ?Suicidal thoughts 0 0 0  ?PHQ-9 Score 0 0 1  ? ? ?Social History  ? ?Tobacco Use  ? Smoking status: Former  ?  Types: Cigarettes  ?  Quit date: 07/28/1974  ?  Years since quitting: 46.8  ? Smokeless tobacco: Never  ?Vaping Use  ? Vaping Use: Never used  ?Substance Use Topics  ? Alcohol use: Yes  ?  Alcohol/week: 2.0  standard drinks  ?  Types: 2 Cans of beer per week  ?  Comment: bi weekly use per pt.  ? Drug use: No  ? ? ?Review of Systems ?Per HPI unless specifically indicated above ? ?   ?Objective:  ?  ?BP 126/78   Pulse 96   Ht '5\' 10"'$  (1.778 m)   Wt 206 lb (93.4 kg)   SpO2 98%   BMI 29.56 kg/m?   ?Wt Readings from Last 3 Encounters:  ?06/01/21 206 lb (93.4 kg)  ?12/01/20 201 lb 12.8 oz (91.5 kg)  ?10/01/20 202 lb (91.6 kg)  ?  ?Physical Exam ?Vitals and nursing note reviewed.  ?Constitutional:   ?   General: He is not in acute distress. ?   Appearance: Normal appearance. He is well-developed. He is not diaphoretic.  ?   Comments: Well-appearing, comfortable, cooperative  ?HENT:  ?   Head: Normocephalic and atraumatic.  ?Eyes:  ?   General:     ?   Right eye: No discharge.     ?   Left eye: No discharge.  ?   Conjunctiva/sclera: Conjunctivae normal.  ?Cardiovascular:  ?   Rate and Rhythm: Normal rate.  ?Pulmonary:  ?   Effort: Pulmonary effort is normal.  ?Musculoskeletal:  ?   Comments: Right Knee bulky appearance with lateral bone  spurs tibial region. Mild reduced ROM and some crepitus  ?Skin: ?   General: Skin is warm and dry.  ?   Findings: No erythema or rash.  ?Neurological:  ?   Mental Status: He is alert and oriented to person, place, and time.  ?Psychiatric:     ?   Mood and Affect: Mood normal.     ?   Behavior: Behavior normal.     ?   Thought Content: Thought content normal.  ?   Comments: Well groomed, good eye contact, normal speech and thoughts  ? ?Results for orders placed or performed in visit on 05/25/21  ?HgB A1c  ?Result Value Ref Range  ? Hgb A1c MFr Bld 9.9 (H) <5.7 % of total Hgb  ? Mean Plasma Glucose 237 mg/dL  ? eAG (mmol/L) 13.2 mmol/L  ?Lipid panel  ?Result Value Ref Range  ? Cholesterol 242 (H) <200 mg/dL  ? HDL 47 > OR = 40 mg/dL  ? Triglycerides 340 (H) <150 mg/dL  ? LDL Cholesterol (Calc) 143 (H) mg/dL (calc)  ? Total CHOL/HDL Ratio 5.1 (H) <5.0 (calc)  ? Non-HDL Cholesterol (Calc) 195  (H) <130 mg/dL (calc)  ? ?   ?Assessment & Plan:  ? ?Problem List Items Addressed This Visit   ? ? Type 2 diabetes mellitus with other specified complication ([place]) - Primary  ?  A1c elevated to 9.9 up from 8.9 ?Attributed to weight gain and poor diet ?Complications - obesity, hyperlipidemia HTN ? ?Plan:  ?1. Continue current therapy - Xigduo XR 10-'1000mg'$  daily ?- Discussion and handout AVS on starting GLP1 therapy, info given on which agent and he will contact insurance to select preferred option, we can do free sample and then order when ready ?2. Encourage improved lifestyle - low carb, low sugar diet, reduce portion size, continue improving regular exercise ?3. Should check CBG ?4. Continue ASA, ARB, not on Statin can review again ?  ?  ? Primary osteoarthritis of right knee  ?  Chronic problem w/ OA DJD R knee ?Prior history arthroscopic surgery years ago ?He is interested in return to Orthopedic ?Will send [place] list of preferred providers and we can send referral. ? ?  ?  ? Hypertension  ?  Well-controlled HTN ?- Home BP readings reviewed  ?No known complications  ? ?  ?Plan:  ?1. Continue current BP regimen Amlodipine '10mg'$  and Telmisartan '80mg'$  daily ?2. Encourage improved lifestyle - low sodium diet, regular exercise ?3. Continue monitor BP outside office, bring readings to next visit, if persistently >140/90 or new symptoms notify office sooner ? ?  ?  ? Hyperlipidemia due to type 2 diabetes mellitus ([place])  ?  Elevated LDL >140 ?The 10-year ASCVD risk score (Arnett DK, et al., 2019) is: 30.1% ?Will review cardiovascular risk and indications at next visit with new results - he is hesitant about statin therapy. ? ?Restart ASA as discussed for ASCVD risk reduction. ? ?Goal to initiate low dose statin therapy in future, will reconsider ? ?Encourage improved lifestyle - low carb/cholesterol, reduce portion size, continue improving regular exercise ? ?Next lipids 11/2021 ?  ?  ?  ?No orders of the defined types were  placed in this encounter. ? ? ? ? ?Follow up plan: ?Return in about 3 months (around 09/01/2021) for 3 month follow-up DM A1c. ? ?Future Annual 11/2021 ? ? ?Nobie Putnam, DO ?[facility] ?[facility] ?06/01/2021, 8:10 AM ?

## 2021-06-01 NOTE — Assessment & Plan Note (Signed)
A1c elevated to 9.9 up from 8.9 ?Attributed to weight gain and poor diet ?Complications - obesity, hyperlipidemia HTN ? ?Plan:  ?1. Continue current therapy - Xigduo XR 10-'1000mg'$  daily ?- Discussion and handout AVS on starting GLP1 therapy, info given on which agent and he will contact insurance to select preferred option, we can do free sample and then order when ready ?2. Encourage improved lifestyle - low carb, low sugar diet, reduce portion size, continue improving regular exercise ?3. Should check CBG ?4. Continue ASA, ARB, not on Statin can review again ?

## 2021-06-01 NOTE — Assessment & Plan Note (Signed)
Elevated LDL >140 ?The 10-year ASCVD risk score (Arnett DK, et al., 2019) is: 30.1% ?Will review cardiovascular risk and indications at next visit with new results - he is hesitant about statin therapy. ? ?Restart ASA as discussed for ASCVD risk reduction. ? ?Goal to initiate low dose statin therapy in future, will reconsider ? ?Encourage improved lifestyle - low carb/cholesterol, reduce portion size, continue improving regular exercise ? ?Next lipids 11/2021 ?

## 2021-06-23 ENCOUNTER — Encounter: Payer: Self-pay | Admitting: Family Medicine

## 2021-06-23 DIAGNOSIS — M545 Low back pain, unspecified: Secondary | ICD-10-CM | POA: Diagnosis not present

## 2021-06-23 DIAGNOSIS — M9905 Segmental and somatic dysfunction of pelvic region: Secondary | ICD-10-CM | POA: Diagnosis not present

## 2021-06-23 DIAGNOSIS — M9903 Segmental and somatic dysfunction of lumbar region: Secondary | ICD-10-CM | POA: Diagnosis not present

## 2021-06-23 DIAGNOSIS — M9904 Segmental and somatic dysfunction of sacral region: Secondary | ICD-10-CM | POA: Diagnosis not present

## 2021-06-23 DIAGNOSIS — E1169 Type 2 diabetes mellitus with other specified complication: Secondary | ICD-10-CM

## 2021-06-24 MED ORDER — MOUNJARO 2.5 MG/0.5ML ~~LOC~~ SOAJ: 2.5000 mg | SUBCUTANEOUS | 0 refills | Status: DC

## 2021-07-14 ENCOUNTER — Other Ambulatory Visit: Payer: Self-pay | Admitting: Family Medicine

## 2021-07-14 DIAGNOSIS — I10 Essential (primary) hypertension: Secondary | ICD-10-CM

## 2021-07-14 MED ORDER — MOUNJARO 2.5 MG/0.5ML ~~LOC~~ SOAJ: 2.5000 mg | SUBCUTANEOUS | 0 refills | Status: DC

## 2021-07-14 NOTE — Addendum Note (Signed)
Addended by: Olin Hauser on: 07/14/2021 06:18 PM   Modules accepted: Orders

## 2021-07-14 NOTE — Telephone Encounter (Signed)
last RF 10/01/20 #90 3 RF for both requested medications.  Requested Prescriptions  Refused Prescriptions Disp Refills  . telmisartan (MICARDIS) 80 MG tablet [Pharmacy Med Name: TELMISARTAN 80 MG TAB] 90 tablet 3    Sig: TAKE 1 TABLET BY MOUTH ONCE DAILY     Cardiovascular:  Angiotensin Receptor Blockers Failed - 07/14/2021  2:49 PM      Failed - Cr in normal range and within 180 days    Creat  Date Value Ref Range Status  11/24/2020 0.71 0.70 - 1.35 mg/dL Final         Failed - K in normal range and within 180 days    Potassium  Date Value Ref Range Status  11/24/2020 4.7 3.5 - 5.3 mmol/L Final         Passed - Patient is not pregnant      Passed - Last BP in normal range    BP Readings from Last 1 Encounters:  06/01/21 126/78         Passed - Valid encounter within last 6 months    Recent Outpatient Visits          1 month ago Type 2 diabetes mellitus with other specified complication, without long-term current use of insulin ([place])   [facility] Olin Hauser, DO   7 months ago Annual physical exam   [facility] Olin Hauser, DO   9 months ago Type 2 diabetes mellitus with other specified complication, without long-term current use of insulin ([place])   [facility] [place], Devonne Doughty, DO   10 months ago Seasonal allergies   [facility] [place], [place], DO   1 year ago Routine general medical examination at a health care facility   [place], [place], DO      Future Appointments            In 1 month Karamalegos, Devonne Doughty, DO [facility] [place]           . amLODipine (Toone) 10 MG tablet [Pharmacy Med Name: AMLODIPINE BESYLATE 10 MG TAB] 90 tablet 3    Sig: TAKE 1 TABLET BY MOUTH ONCE DAILY     Cardiovascular: Calcium Channel Blockers 2 Passed - 07/14/2021  2:49 PM      Passed - Last BP in normal range    BP Readings from Last 1  Encounters:  06/01/21 126/78         Passed - Last Heart Rate in normal range    Pulse Readings from Last 1 Encounters:  06/01/21 96         Passed - Valid encounter within last 6 months    Recent Outpatient Visits          1 month ago Type 2 diabetes mellitus with other specified complication, without long-term current use of insulin ([place])   [place], DO   7 months ago Annual physical exam   [place], DO   9 months ago Type 2 diabetes mellitus with other specified complication, without long-term current use of insulin [facility])   [facility] [place], Devonne Doughty, DO   10 months ago Seasonal allergies   [facility] [place], Megan P, DO   1 year ago Routine general medical examination at a health care facility   [facility], [place], DO  Future Appointments            In 1 month Karamalegos, Devonne Doughty, DO [facility]

## 2021-07-28 DIAGNOSIS — M545 Low back pain, unspecified: Secondary | ICD-10-CM | POA: Diagnosis not present

## 2021-07-28 DIAGNOSIS — M9904 Segmental and somatic dysfunction of sacral region: Secondary | ICD-10-CM | POA: Diagnosis not present

## 2021-07-28 DIAGNOSIS — M9905 Segmental and somatic dysfunction of pelvic region: Secondary | ICD-10-CM | POA: Diagnosis not present

## 2021-07-28 DIAGNOSIS — M9903 Segmental and somatic dysfunction of lumbar region: Secondary | ICD-10-CM | POA: Diagnosis not present

## 2021-08-18 DIAGNOSIS — M9905 Segmental and somatic dysfunction of pelvic region: Secondary | ICD-10-CM | POA: Diagnosis not present

## 2021-08-18 DIAGNOSIS — M545 Low back pain, unspecified: Secondary | ICD-10-CM | POA: Diagnosis not present

## 2021-08-18 DIAGNOSIS — M9904 Segmental and somatic dysfunction of sacral region: Secondary | ICD-10-CM | POA: Diagnosis not present

## 2021-08-18 DIAGNOSIS — M9903 Segmental and somatic dysfunction of lumbar region: Secondary | ICD-10-CM | POA: Diagnosis not present

## 2021-09-07 ENCOUNTER — Encounter: Payer: Self-pay | Admitting: Family Medicine

## 2021-09-07 ENCOUNTER — Other Ambulatory Visit: Payer: Self-pay | Admitting: Family Medicine

## 2021-09-07 ENCOUNTER — Ambulatory Visit (INDEPENDENT_AMBULATORY_CARE_PROVIDER_SITE_OTHER): Payer: PPO | Admitting: Family Medicine

## 2021-09-07 VITALS — BP 115/76 | HR 88 | Ht 70.0 in | Wt 202.4 lb

## 2021-09-07 DIAGNOSIS — Z23 Encounter for immunization: Secondary | ICD-10-CM | POA: Diagnosis not present

## 2021-09-07 DIAGNOSIS — R351 Nocturia: Secondary | ICD-10-CM

## 2021-09-07 DIAGNOSIS — I10 Essential (primary) hypertension: Secondary | ICD-10-CM

## 2021-09-07 DIAGNOSIS — E1169 Type 2 diabetes mellitus with other specified complication: Secondary | ICD-10-CM

## 2021-09-07 DIAGNOSIS — Z0000 Encounter for general adult medical examination without abnormal findings: Secondary | ICD-10-CM

## 2021-09-07 LAB — POCT GLYCOSYLATED HEMOGLOBIN (HGB A1C): Hemoglobin A1C: 8.5 % — AB (ref 4.0–5.6)

## 2021-09-07 MED ORDER — MOUNJARO 5 MG/0.5ML ~~LOC~~ SOAJ: 5.0000 mg | SUBCUTANEOUS | 1 refills | Status: DC

## 2021-09-07 MED ORDER — TELMISARTAN 80 MG PO TABS: 80.0000 mg | ORAL_TABLET | Freq: Every day | ORAL | 3 refills | Status: DC

## 2021-09-07 MED ORDER — AMLODIPINE BESYLATE 10 MG PO TABS: 10.0000 mg | ORAL_TABLET | Freq: Every day | ORAL | 3 refills | Status: DC

## 2021-09-07 NOTE — Assessment & Plan Note (Signed)
Well-controlled HTN - Home BP readings reviewed  No known complications     Plan:  1. Continue current BP regimen Amlodipine 10mg and Telmisartan 80mg daily 2. Encourage improved lifestyle - low sodium diet, regular exercise 3. Continue monitor BP outside office, bring readings to next visit, if persistently >140/90 or new symptoms notify office sooner 

## 2021-09-07 NOTE — Patient Instructions (Addendum)
Thank you for coming to the office today.  Recent Labs    11/24/20 0848 05/25/21 0756 09/07/21 0811  HGBA1C 8.9* 9.9* 8.5*   Dose increase Mounjaro 5 mg weekly inj. For 3 months we can adjust in future.  Shingles vaccine today 1st dose, repeat 2-6 months.  Refilled BP medications  DUE for FASTING BLOOD WORK (no food or drink after midnight before the lab appointment, only water or coffee without cream/sugar on the morning of)  SCHEDULE "Lab Only" visit in the morning at the clinic for lab draw in 3 MONTHS   - Make sure Lab Only appointment is at about 1 week before your next appointment, so that results will be available  For Lab Results, once available within 2-3 days of blood draw, you can can log in to MyChart online to view your results and a brief explanation. Also, we can discuss results at next follow-up visit.    Please schedule a Follow-up Appointment to: Return in about 3 months (around 12/08/2021) for 3 month fasting lab only then 2-3 days later Annual Physical + Shingles #2.  If you have any other questions or concerns, please feel free to call the office or send a message through [place]. You may also schedule an earlier appointment if necessary.  Additionally, you may be receiving a survey about your experience at our office within a few days to 1 week by e-mail or mail. We value your feedback.  Nobie Putnam, DO [place]

## 2021-09-07 NOTE — Progress Notes (Signed)
Subjective:    Patient ID: Philip Ryan., male    DOB: Apr 13, 1955, 66 y.o.   MRN: [id number]  Philip Ryan. is a 66 y.o. male presenting on 09/07/2021 for Diabetes   HPI  CHRONIC DM, Type 2: Previously A1c 9.9, on oral medication Xigduo, he was changed to GIP/GLP Mounjaro last visit Due today for A1c CBGs: Improving Meds: Mounjaro 2.'5mg'$  weekly inj x 4 doses completed, took a while to receive med - Xigduo XR 10-'1000mg'$  24HR Reports good compliance. Tolerating well w/o side-effects Currently on ARB Due for DM Eye exam - [facility] Denies hypoglycemia, polyuria, visual changes, numbness or tingling.   CHRONIC HTN: Reports controlled BP Current Meds - Amlodipine '10mg'$  daily, Telmisartan '80mg'$  daily   Reports good compliance, took meds today. Tolerating well, w/o complaints. Denies CP, dyspnea, HA, edema, dizziness / lightheadedness    Osteoarthritis Right Knee He has significant bone spurs. Prior Ortho, Dr Philip Ryan - Arthroscopic knee surgery 30-40 years ago.   Health Maintenance:  Recommended Pneumonia Prevnar 20.  Recommend Shingrix - 2-6 months apart x 2 doses.  Future Flu shot     09/07/2021    8:06 AM 09/17/2020    8:04 AM 10/09/2019    9:21 AM  Depression screen PHQ 2/9  Decreased Interest 0 0 0  Down, Depressed, Hopeless 0 0 0  PHQ - 2 Score 0 0 0  Altered sleeping 0 0 0  Tired, decreased energy 0 0 0  Change in appetite 0 0 0  Feeling bad or failure about yourself  0 0 0  Trouble concentrating 0 0 0  Moving slowly or fidgety/restless 0 0 0  Suicidal thoughts 0 0 0  PHQ-9 Score 0 0 0  Difficult doing work/chores Not difficult at all      Social History   Tobacco Use   Smoking status: Former    Types: Cigarettes    Quit date: 07/28/1974    Years since quitting: 47.1   Smokeless tobacco: Never  Vaping Use   Vaping Use: Never used  Substance Use Topics   Alcohol use: Yes    Alcohol/week: 2.0 standard drinks of alcohol    Types: 2  Cans of beer per week    Comment: bi weekly use per pt.   Drug use: No    Review of Systems Per HPI unless specifically indicated above     Objective:    BP 115/76   Pulse 88   Ht '5\' 10"'$  (1.778 m)   Wt 202 lb 6.4 oz (91.8 kg)   SpO2 99%   BMI 29.04 kg/m   Wt Readings from Last 3 Encounters:  09/07/21 202 lb 6.4 oz (91.8 kg)  06/01/21 206 lb (93.4 kg)  12/01/20 201 lb 12.8 oz (91.5 kg)    Physical Exam Vitals and nursing note reviewed.  Constitutional:      General: He is not in acute distress.    Appearance: He is well-developed. He is obese. He is not diaphoretic.     Comments: Well-appearing, comfortable, cooperative  HENT:     Head: Normocephalic and atraumatic.  Eyes:     General:        Right eye: No discharge.        Left eye: No discharge.     Conjunctiva/sclera: Conjunctivae normal.  Neck:     Thyroid: No thyromegaly.  Cardiovascular:     Rate and Rhythm: Normal rate and regular rhythm.     Pulses:  Normal pulses.     Heart sounds: Normal heart sounds. No murmur heard. Pulmonary:     Effort: Pulmonary effort is normal. No respiratory distress.     Breath sounds: Normal breath sounds. No wheezing or rales.  Musculoskeletal:        General: Normal range of motion.     Cervical back: Normal range of motion and neck supple.  Lymphadenopathy:     Cervical: No cervical adenopathy.  Skin:    General: Skin is warm and dry.     Findings: No erythema or rash.  Neurological:     Mental Status: He is alert and oriented to person, place, and time. Mental status is at baseline.  Psychiatric:        Behavior: Behavior normal.     Comments: Well groomed, good eye contact, normal speech and thoughts      Results for orders placed or performed in visit on 09/07/21  POCT HgB A1C  Result Value Ref Range   Hemoglobin A1C 8.5 (A) 4.0 - 5.6 %      Assessment & Plan:   Problem List Items Addressed This Visit     Hypertension    Well-controlled HTN - Home BP  readings reviewed  No known complications     Plan:  1. Continue current BP regimen Amlodipine '10mg'$  and Telmisartan '80mg'$  daily 2. Encourage improved lifestyle - low sodium diet, regular exercise 3. Continue monitor BP outside office, bring readings to next visit, if persistently >140/90 or new symptoms notify office sooner      Relevant Medications   amLODipine (NORVASC) 10 MG tablet   telmisartan (MICARDIS) 80 MG tablet   Type 2 diabetes mellitus with other specified complication (H[facility]) - Primary    Significant improvement on GIP/GLP Mounjaro now Improved weight loss Complications - obesity, hyperlipidemia HTN  Plan:  1. Dose increase Mounjaro to '5mg'$  weekly inj, new rx sent. Also continue current therapy - Xigduo XR 10-'1000mg'$  daily - future can reduce if improved on higher Mounjaro 2. Encourage improved lifestyle - low carb, low sugar diet, reduce portion size, continue improving regular exercise 3. Should check CBG 4. Continue ASA, ARB, not on Statin can review again      Relevant Medications   MOUNJARO 5 MG/0.5ML Pen   telmisartan (MICARDIS) 80 MG tablet   Other Relevant Orders   POCT HgB A1C (Completed)   Other Visit Diagnoses     Need for shingles vaccine       Relevant Orders   Varicella-zoster vaccine IM (Completed)       Orders Placed This Encounter  Procedures   Varicella-zoster vaccine IM   POCT HgB A1C     Meds ordered this encounter  Medications   MOUNJARO 5 MG/0.5ML Pen    Sig: Inject 5 mg into the skin once a week.    Dispense:  6 mL    Refill:  1    Dose increase from 2.5 to '5mg'$    amLODipine (NORVASC) 10 MG tablet    Sig: Take 1 tablet (10 mg total) by mouth daily.    Dispense:  90 tablet    Refill:  3   telmisartan (MICARDIS) 80 MG tablet    Sig: Take 1 tablet (80 mg total) by mouth daily.    Dispense:  90 tablet    Refill:  3      Follow up plan: Return in about 3 months (around 12/08/2021) for 3 month fasting lab only then 2-3 days  later  Annual Physical + Shingles #2.  Future labs ordered for 12/07/21   Philip Ryan, [facility] 09/07/2021, 8:10 AM

## 2021-09-07 NOTE — Assessment & Plan Note (Addendum)
Significant improvement on GIP/GLP Mounjaro now Improved weight loss Complications - obesity, hyperlipidemia HTN  Plan:  1. Dose increase Mounjaro to '5mg'$  weekly inj, new rx sent. Also continue current therapy - Xigduo XR 10-'1000mg'$  daily - future can reduce if improved on higher Mounjaro 2. Encourage improved lifestyle - low carb, low sugar diet, reduce portion size, continue improving regular exercise 3. Should check CBG 4. Continue ASA, ARB, not on Statin can review again

## 2021-09-21 DIAGNOSIS — I10 Essential (primary) hypertension: Secondary | ICD-10-CM | POA: Diagnosis not present

## 2021-09-21 DIAGNOSIS — Z87891 Personal history of nicotine dependence: Secondary | ICD-10-CM | POA: Diagnosis not present

## 2021-09-21 DIAGNOSIS — E663 Overweight: Secondary | ICD-10-CM | POA: Diagnosis not present

## 2021-09-21 DIAGNOSIS — E119 Type 2 diabetes mellitus without complications: Secondary | ICD-10-CM | POA: Diagnosis not present

## 2021-09-22 DIAGNOSIS — M9904 Segmental and somatic dysfunction of sacral region: Secondary | ICD-10-CM | POA: Diagnosis not present

## 2021-09-22 DIAGNOSIS — M9905 Segmental and somatic dysfunction of pelvic region: Secondary | ICD-10-CM | POA: Diagnosis not present

## 2021-09-22 DIAGNOSIS — M545 Low back pain, unspecified: Secondary | ICD-10-CM | POA: Diagnosis not present

## 2021-09-22 DIAGNOSIS — M9903 Segmental and somatic dysfunction of lumbar region: Secondary | ICD-10-CM | POA: Diagnosis not present

## 2021-09-23 ENCOUNTER — Telehealth: Payer: Self-pay

## 2021-09-23 NOTE — Telephone Encounter (Signed)
Please review.  Do you do this here?   Thanks,   -Mickel Baas

## 2021-09-23 NOTE — Telephone Encounter (Signed)
Copied from [place] [id number]. Topic: General - Other >> Sep 22, 2021  8:18 AM Charlotte Sanes J wrote: Reason for CRM: Pt called to get Triamcinolone '40mg'$  injection scheduled / please advise

## 2021-09-24 NOTE — Telephone Encounter (Signed)
He is asking for a regular IM Injection Triamcinolone '40mg'$  for seasonal allergies.  He does not need a joint injection. So I would not need to do this injection.  He may schedule a nurse visit anytime for this.  Would you be able to confirm if we have Triamcinolone '40mg'$  dose injection available?  If not, then we may offer Methylprednisolone '40mg'$  IM - as equivalent dose.  Nobie Putnam, [facility] 09/24/2021, 12:49 PM

## 2021-09-27 NOTE — Telephone Encounter (Signed)
Pt is just needing triamcinolone for seasonal allergies.  Nurse visit schedule for 09/13   Thanks,   -Mickel Baas

## 2021-09-29 ENCOUNTER — Ambulatory Visit (INDEPENDENT_AMBULATORY_CARE_PROVIDER_SITE_OTHER): Payer: PPO

## 2021-09-29 VITALS — Wt 202.0 lb

## 2021-09-29 DIAGNOSIS — J309 Allergic rhinitis, unspecified: Secondary | ICD-10-CM

## 2021-09-29 MED ORDER — TRIAMCINOLONE ACETONIDE 40 MG/ML IJ SUSP
40.0000 mg | Freq: Once | INTRAMUSCULAR | Status: AC
  Administered 2021-09-29: 40 mg via INTRAMUSCULAR

## 2021-10-16 ENCOUNTER — Other Ambulatory Visit: Payer: Self-pay | Admitting: Family Medicine

## 2021-10-16 DIAGNOSIS — E1169 Type 2 diabetes mellitus with other specified complication: Secondary | ICD-10-CM

## 2021-10-18 NOTE — Telephone Encounter (Signed)
90 day refill- has f/u appt and lab work in November-  Requested Prescriptions  Pending Prescriptions Disp Refills  . XIGDUO XR 10-998 MG TB24 [Pharmacy Med Name: XIGDUO XR 10-998 MG TAB] 90 tablet 0    Sig: TAKE 1 TABLET BY MOUTH ONCE DAILY     Endocrinology:  Diabetes - Biguanide + SGLT2 Inhibitor Combos Failed - 10/16/2021  1:40 PM      Failed - HBA1C is between 0 and 7.9 and within 180 days    Hemoglobin A1C  Date Value Ref Range Status  09/07/2021 8.5 (A) 4.0 - 5.6 % Final  09/08/2015 7.8  Final   HB A1C (BAYER DCA - WAIVED)  Date Value Ref Range Status  10/09/2019 8.7 (H) <7.0 % Final    Comment:                                          Diabetic Adult            <7.0                                       Healthy Adult        4.3 - 5.7                                                           (DCCT/NGSP) American Diabetes Association's Summary of Glycemic Recommendations for Adults with Diabetes: Hemoglobin A1c <7.0%. More stringent glycemic goals (A1c <6.0%) may further reduce complications at the cost of increased risk of hypoglycemia.    Hgb A1c MFr Bld  Date Value Ref Range Status  05/25/2021 9.9 (H) <5.7 % of total Hgb Final    Comment:    For someone without known diabetes, a hemoglobin A1c value of 6.5% or greater indicates that they may have  diabetes and this should be confirmed with a follow-up  test. . For someone with known diabetes, a value <7% indicates  that their diabetes is well controlled and a value  greater than or equal to 7% indicates suboptimal  control. A1c targets should be individualized based on  duration of diabetes, age, comorbid conditions, and  other considerations. . Currently, no consensus exists regarding use of hemoglobin A1c for diagnosis of diabetes for children. .          Failed - B12 Level in normal range and within 720 days    No results found for: "VITAMINB12"       Passed - Cr in normal range and within 360 days     Creat  Date Value Ref Range Status  11/24/2020 0.71 0.70 - 1.35 mg/dL Final         Passed - eGFR in normal range and within 360 days    GFR calc Af Amer  Date Value Ref Range Status  10/09/2019 114 >59 mL/min/1.73 Final    Comment:    **[facility] currently reports eGFR in compliance with the current**   recommendations of the [employer]. [facility] will   update reporting as new guidelines are published from the NKF-ASN   Task force.  GFR calc non Af Amer  Date Value Ref Range Status  10/09/2019 99 >59 mL/min/1.73 Final   eGFR  Date Value Ref Range Status  11/24/2020 102 > OR = 60 mL/min/1.22m Final    Comment:    The eGFR is based on the CKD-EPI 2021 equation. To calculate  the new eGFR from a previous Creatinine or Cystatin C result, go to https://www.kidney.org/professionals/ kdoqi/gfr%5Fcalculator          Passed - Valid encounter within last 6 months    Recent Outpatient Visits          1 month ago Type 2 diabetes mellitus with other specified complication, without long-term current use of insulin ([place]   [facility]onne [facility]ought[facility]   4 months ago Type 2 diabetes mellitus with other specified complication, without long-term current use of insulin ([place]   [facility]ought[facility]   10 months ago Annual physical exam   [facility]   1 year ago Type 2 diabetes mellitus with other specified complication, without long-term current use of insulin ([place]   [facility]   1 year ago Seasonal allergies   [facility]      Future Appointments            In 1 month Karamalegos, ADevonne Doughty DO [facility] PHartmanwithin normal limits and completed in the last 12 months    WBC  Date Value Ref Range Status  11/24/2020 4.7 3.8 - 10.8  Thousand/uL Final   RBC  Date Value Ref Range Status  11/24/2020 5.74 4.20 - 5.80 Million/uL Final   Hemoglobin  Date Value Ref Range Status  11/24/2020 16.9 13.2 - 17.1 g/dL Final  10/09/2019 16.4 13.0 - 17.7 g/dL Final   HCT  Date Value Ref Range Status  11/24/2020 52.3 (H) 38.5 - 50.0 % Final   Hematocrit  Date Value Ref Range Status  10/09/2019 49.7 37.5 - 51.0 % Final   MCHC  Date Value Ref Range Status  11/24/2020 32.3 32.0 - 36.0 g/dL Final   [place] Date Value Ref Range Status  11/24/2020 29.4 27.0 - 33.0 pg Final   MCV  Date Value Ref Range Status  11/24/2020 91.1 80.0 - 100.0 fL Final  10/09/2019 90 79 - 97 fL Final   No results found for: "PLTCOUNTKUC", "LABPLAT", "POCPLA" RDW  Date Value Ref Range Status  11/24/2020 12.5 11.0 - 15.0 % Final  10/09/2019 13.0 11.6 - 15.4 % Final

## 2021-10-20 DIAGNOSIS — M9903 Segmental and somatic dysfunction of lumbar region: Secondary | ICD-10-CM | POA: Diagnosis not present

## 2021-10-20 DIAGNOSIS — M9904 Segmental and somatic dysfunction of sacral region: Secondary | ICD-10-CM | POA: Diagnosis not present

## 2021-10-20 DIAGNOSIS — M9905 Segmental and somatic dysfunction of pelvic region: Secondary | ICD-10-CM | POA: Diagnosis not present

## 2021-10-20 DIAGNOSIS — M545 Low back pain, unspecified: Secondary | ICD-10-CM | POA: Diagnosis not present

## 2021-11-12 ENCOUNTER — Other Ambulatory Visit: Payer: Self-pay | Admitting: Family Medicine

## 2021-11-12 DIAGNOSIS — E1169 Type 2 diabetes mellitus with other specified complication: Secondary | ICD-10-CM

## 2021-11-12 NOTE — Telephone Encounter (Signed)
Requested Prescriptions  Pending Prescriptions Disp Refills  . XIGDUO XR 10-998 MG TB24 [Pharmacy Med Name: XIGDUO XR 10-998 MG TAB] 180 tablet 0    Sig: TAKE 1 TABLET BY MOUTH ONCE DAILY     Endocrinology:  Diabetes - Biguanide + SGLT2 Inhibitor Combos Failed - 11/12/2021  3:53 PM      Failed - HBA1C is between 0 and 7.9 and within 180 days    Hemoglobin A1C  Date Value Ref Range Status  09/07/2021 8.5 (A) 4.0 - 5.6 % Final  09/08/2015 7.8  Final   HB A1C (BAYER DCA - WAIVED)  Date Value Ref Range Status  10/09/2019 8.7 (H) <7.0 % Final    Comment:                                          Diabetic Adult            <7.0                                       Healthy Adult        4.3 - 5.7                                                           (DCCT/NGSP) American Diabetes Association's Summary of Glycemic Recommendations for Adults with Diabetes: Hemoglobin A1c <7.0%. More stringent glycemic goals (A1c <6.0%) may further reduce complications at the cost of increased risk of hypoglycemia.    Hgb A1c MFr Bld  Date Value Ref Range Status  05/25/2021 9.9 (H) <5.7 % of total Hgb Final    Comment:    For someone without known diabetes, a hemoglobin A1c value of 6.5% or greater indicates that they may have  diabetes and this should be confirmed with a follow-up  test. . For someone with known diabetes, a value <7% indicates  that their diabetes is well controlled and a value  greater than or equal to 7% indicates suboptimal  control. A1c targets should be individualized based on  duration of diabetes, age, comorbid conditions, and  other considerations. . Currently, no consensus exists regarding use of hemoglobin A1c for diagnosis of diabetes for children. .          Failed - B12 Level in normal range and within 720 days    No results found for: "VITAMINB12"       Passed - Cr in normal range and within 360 days    Creat  Date Value Ref Range Status  11/24/2020 0.71  0.70 - 1.35 mg/dL Final         Passed - eGFR in normal range and within 360 days    GFR calc Af Amer  Date Value Ref Range Status  10/09/2019 114 >59 mL/min/1.73 Final    Comment:    **[facility] currently reports eGFR in compliance with the current**   recommendations of the [employer]. [facility] will   update reporting as new guidelines are published from the NKF-ASN   Task force.    GFR calc non Af Amer  Date Value Ref Range Status  10/09/2019 99 >59 mL/min/1.73 Final   eGFR  Date Value Ref Range Status  11/24/2020 102 > OR = 60 mL/min/1.35m Final    Comment:    The eGFR is based on the CKD-EPI 2021 equation. To calculate  the new eGFR from a previous Creatinine or Cystatin C result, go to https://www.kidney.org/professionals/ kdoqi/gfr%5Fcalculator          Passed - Valid encounter within last 6 months    Recent Outpatient Visits          2 months ago Type 2 diabetes mellitus with other specified complication, without long-term current use of insulin ([place]   [facility]evonne Doughty DO   5 months ago Type 2 diabetes mellitus with other specified complication, without long-term current use of insulin ([place]   [facility]   11 months ago Annual physical exam   [facility]   1 year ago Type 2 diabetes mellitus with other specified complication, without long-term current use of insulin (HL[place]   [facility]   1 year ago Seasonal allergies   [facility]      Future Appointments            In 1 month Karamalegos, ADevonne Doughty DO [facility] PLackland AFBwithin normal limits and completed in the last 12 months    WBC  Date Value Ref Range Status  11/24/2020 4.7 3.8 - 10.8 Thousand/uL Final   RBC  Date Value Ref Range Status   11/24/2020 5.74 4.20 - 5.80 Million/uL Final   Hemoglobin  Date Value Ref Range Status  11/24/2020 16.9 13.2 - 17.1 g/dL Final  10/09/2019 16.4 13.0 - 17.7 g/dL Final   HCT  Date Value Ref Range Status  11/24/2020 52.3 (H) 38.5 - 50.0 % Final   Hematocrit  Date Value Ref Range Status  10/09/2019 49.7 37.5 - 51.0 % Final   MCHC  Date Value Ref Range Status  11/24/2020 32.3 32.0 - 36.0 g/dL Final   [facility] Date Value Ref Range Status  11/24/2020 29.4 27.0 - 33.0 pg Final   MCV  Date Value Ref Range Status  11/24/2020 91.1 80.0 - 100.0 fL Final  10/09/2019 90 79 - 97 fL Final   No results found for: "PLTCOUNTKUC", "LABPLAT", "POCPLA" RDW  Date Value Ref Range Status  11/24/2020 12.5 11.0 - 15.0 % Final  10/09/2019 13.0 11.6 - 15.4 % Final

## 2021-11-17 DIAGNOSIS — M9904 Segmental and somatic dysfunction of sacral region: Secondary | ICD-10-CM | POA: Diagnosis not present

## 2021-11-17 DIAGNOSIS — M9903 Segmental and somatic dysfunction of lumbar region: Secondary | ICD-10-CM | POA: Diagnosis not present

## 2021-11-17 DIAGNOSIS — M545 Low back pain, unspecified: Secondary | ICD-10-CM | POA: Diagnosis not present

## 2021-11-17 DIAGNOSIS — M9905 Segmental and somatic dysfunction of pelvic region: Secondary | ICD-10-CM | POA: Diagnosis not present

## 2021-12-06 ENCOUNTER — Other Ambulatory Visit: Payer: Self-pay

## 2021-12-06 DIAGNOSIS — Z0000 Encounter for general adult medical examination without abnormal findings: Secondary | ICD-10-CM

## 2021-12-06 DIAGNOSIS — R351 Nocturia: Secondary | ICD-10-CM

## 2021-12-06 DIAGNOSIS — I10 Essential (primary) hypertension: Secondary | ICD-10-CM

## 2021-12-06 DIAGNOSIS — E1169 Type 2 diabetes mellitus with other specified complication: Secondary | ICD-10-CM

## 2021-12-07 ENCOUNTER — Other Ambulatory Visit: Payer: PPO

## 2021-12-07 DIAGNOSIS — Z0000 Encounter for general adult medical examination without abnormal findings: Secondary | ICD-10-CM | POA: Diagnosis not present

## 2021-12-07 DIAGNOSIS — E785 Hyperlipidemia, unspecified: Secondary | ICD-10-CM | POA: Diagnosis not present

## 2021-12-07 DIAGNOSIS — R351 Nocturia: Secondary | ICD-10-CM | POA: Diagnosis not present

## 2021-12-07 DIAGNOSIS — I10 Essential (primary) hypertension: Secondary | ICD-10-CM | POA: Diagnosis not present

## 2021-12-07 DIAGNOSIS — E1169 Type 2 diabetes mellitus with other specified complication: Secondary | ICD-10-CM | POA: Diagnosis not present

## 2021-12-08 LAB — CBC WITH DIFFERENTIAL/PLATELET
Absolute Monocytes: 249 cells/uL (ref 200–950)
Basophils Absolute: 80 cells/uL (ref 0–200)
Basophils Relative: 1.7 %
Eosinophils Absolute: 160 cells/uL (ref 15–500)
Eosinophils Relative: 3.4 %
HCT: 48.4 % (ref 38.5–50.0)
Hemoglobin: 16.9 g/dL (ref 13.2–17.1)
Lymphs Abs: 1278 cells/uL (ref 850–3900)
MCH: 30 pg (ref 27.0–33.0)
MCHC: 34.9 g/dL (ref 32.0–36.0)
MCV: 86 fL (ref 80.0–100.0)
MPV: 10.1 fL (ref 7.5–12.5)
Monocytes Relative: 5.3 %
Neutro Abs: 2933 cells/uL (ref 1500–7800)
Neutrophils Relative %: 62.4 %
Platelets: 193 10*3/uL (ref 140–400)
RBC: 5.63 10*6/uL (ref 4.20–5.80)
RDW: 12.6 % (ref 11.0–15.0)
Total Lymphocyte: 27.2 %
WBC: 4.7 10*3/uL (ref 3.8–10.8)

## 2021-12-08 LAB — COMPLETE METABOLIC PANEL WITH GFR
AG Ratio: 1.7 (calc) (ref 1.0–2.5)
ALT: 16 U/L (ref 9–46)
AST: 14 U/L (ref 10–35)
Albumin: 4.3 g/dL (ref 3.6–5.1)
Alkaline phosphatase (APISO): 59 U/L (ref 35–144)
BUN/Creatinine Ratio: 19 (calc) (ref 6–22)
BUN: 12 mg/dL (ref 7–25)
CO2: 26 mmol/L (ref 20–32)
Calcium: 9.2 mg/dL (ref 8.6–10.3)
Chloride: 102 mmol/L (ref 98–110)
Creat: 0.63 mg/dL — ABNORMAL LOW (ref 0.70–1.35)
Globulin: 2.6 g/dL (calc) (ref 1.9–3.7)
Glucose, Bld: 133 mg/dL — ABNORMAL HIGH (ref 65–99)
Potassium: 4.3 mmol/L (ref 3.5–5.3)
Sodium: 137 mmol/L (ref 135–146)
Total Bilirubin: 1.1 mg/dL (ref 0.2–1.2)
Total Protein: 6.9 g/dL (ref 6.1–8.1)
eGFR: 106 mL/min/{1.73_m2} (ref >=60)

## 2021-12-08 LAB — LIPID PANEL
Cholesterol: 197 mg/dL (ref <200)
HDL: 51 mg/dL (ref >=40)
LDL Cholesterol (Calc): 118 mg/dL (calc) — ABNORMAL HIGH
Non-HDL Cholesterol (Calc): 146 mg/dL (calc) — ABNORMAL HIGH (ref <130)
Total CHOL/HDL Ratio: 3.9 (calc) (ref <5.0)
Triglycerides: 163 mg/dL — ABNORMAL HIGH (ref <150)

## 2021-12-08 LAB — PSA: PSA: 0.68 ng/mL (ref <=4.00)

## 2021-12-08 LAB — HEMOGLOBIN A1C
Hgb A1c MFr Bld: 7.5 % of total Hgb — ABNORMAL HIGH (ref <5.7)
Mean Plasma Glucose: 169 mg/dL
eAG (mmol/L): 9.3 mmol/L

## 2021-12-08 LAB — TSH: TSH: 3.62 mIU/L (ref 0.40–4.50)

## 2021-12-14 ENCOUNTER — Encounter: Payer: Self-pay | Admitting: Family Medicine

## 2021-12-14 ENCOUNTER — Ambulatory Visit (INDEPENDENT_AMBULATORY_CARE_PROVIDER_SITE_OTHER): Payer: PPO | Admitting: Family Medicine

## 2021-12-14 VITALS — BP 124/82 | HR 102 | Ht 70.0 in | Wt 197.8 lb

## 2021-12-14 DIAGNOSIS — G5602 Carpal tunnel syndrome, left upper limb: Secondary | ICD-10-CM | POA: Diagnosis not present

## 2021-12-14 DIAGNOSIS — E1169 Type 2 diabetes mellitus with other specified complication: Secondary | ICD-10-CM | POA: Diagnosis not present

## 2021-12-14 DIAGNOSIS — N529 Male erectile dysfunction, unspecified: Secondary | ICD-10-CM | POA: Diagnosis not present

## 2021-12-14 DIAGNOSIS — Z23 Encounter for immunization: Secondary | ICD-10-CM

## 2021-12-14 DIAGNOSIS — E785 Hyperlipidemia, unspecified: Secondary | ICD-10-CM

## 2021-12-14 DIAGNOSIS — I10 Essential (primary) hypertension: Secondary | ICD-10-CM | POA: Diagnosis not present

## 2021-12-14 DIAGNOSIS — Z0000 Encounter for general adult medical examination without abnormal findings: Secondary | ICD-10-CM | POA: Diagnosis not present

## 2021-12-14 MED ORDER — SILDENAFIL CITRATE 20 MG PO TABS: ORAL_TABLET | ORAL | 3 refills | Status: DC

## 2021-12-14 MED ORDER — MOUNJARO 7.5 MG/0.5ML ~~LOC~~ SOAJ: 7.5000 mg | SUBCUTANEOUS | 1 refills | Status: DC

## 2021-12-14 NOTE — Assessment & Plan Note (Signed)
Improved LDL 118 The 10-year ASCVD risk score (Arnett DK, et al., 2019) is: 24.8% Will review cardiovascular risk and indications at next visit with new results - he is hesitant about statin therapy.  ASA as discussed for ASCVD risk reduction.  Goal to initiate low dose statin therapy in future, will reconsider  Encourage improved lifestyle - low carb/cholesterol, reduce portion size, continue improving regular exercise

## 2021-12-14 NOTE — Patient Instructions (Addendum)
Thank you for coming to the office today.  Prevnar-20 pneumonia vaccine age 65+ 1 dose and done. Can do here or at pharmacy, after age 28.  Dose increase Mounjaro '5mg'$  up to 7.'5mg'$ , new higher dose try again 3 month, supply. See what the pharmacy says on cost / supply. Let me know if questions.  Eventually we can discontinue or reduce Xigduo.  Reminder to schedule with eye doctor Dec or Jan  ---------------------------  Start Sildenafil '20mg'$  - take 1 tablet about 30 min prior to sexual intercourse for improved erection. If this dose does not work or is not strong enough NEXT TIME you can increase to 2 pills for '40mg'$ . Maximum dose is 5 pills or '100mg'$ , most people end up taking 3-4 pills per dose and this decision is up to you based on the results.  Once you take a dose, you have to wait 24 hours to repeat a dose.  You will need to use www.goodrx.com website or app on phone to enter "Sildenafil" '20mg'$  medication and "Get Free Coupon" option to save for [facility] once you select pharmacy and # of pills. Show that coupon to pharmacy. Otherwise it will cost hundreds of dollars.  Follow up if not working or new concerns we can refer you to a Urologist.   You most likely have Carpal Tunnel Syndrome of Left hand/wrist based on your symptoms. This a problem of compression on the nerve entering the hand at the wrist. Often it is caused by long history of overuse or repetitive activities that put strain on the nerve within wrist. Occasionally this can be caused by swelling or weight gain and pressure on this nerve as well.  Wear a wrist splint/brace for carpal tunnel at night while sleeping  START anti inflammatory topical - OTC Voltaren (generic Diclofenac) topical 2-4 times a day as needed for pain swelling of affected joint for 1-2 weeks or longer.  Recommend to start taking Tylenol Extra Strength '500mg'$  tabs - take 1 to 2 tabs per dose (max '1000mg'$ ) every 6-8 hours for pain (take regularly, don't  skip a dose for next 7 days), max 24 hour daily dose is 6 tablets or '3000mg'$ . In the future you can repeat the same everyday Tylenol course for 1-2 weeks at a time.   If need we can - Referral to Neurologist - for Nerve Conduction Study and also consultation to discuss test results and treatment options  [facility] - Neurology Dept [place], [place] [address] Phone: [phone]  Consider steroid injection, other therapy and possibly referral to Orthopedic surgery for possible carpal tunnel release surgery as mentioned    Please schedule a Follow-up Appointment to: Return in about 6 months (around 06/14/2022) for 6 month DM A1c.  If you have any other questions or concerns, please feel free to call the office or send a message through [employer]. You may also schedule an earlier appointment if necessary.  Additionally, you may be receiving a survey about your experience at our office within a few days to 1 week by e-mail or mail. We value your feedback.  Nobie Putnam, DO [place]

## 2021-12-14 NOTE — Assessment & Plan Note (Signed)
Well-controlled HTN - Home BP readings reviewed  No known complications     Plan:  1. Continue current BP regimen Amlodipine '10mg'$  and Telmisartan '80mg'$  daily 2. Encourage improved lifestyle - low sodium diet, regular exercise 3. Continue monitor BP outside office, bring readings to next visit, if persistently >140/90 or new symptoms notify office sooner

## 2021-12-14 NOTE — Assessment & Plan Note (Signed)
Significant improvement on GIP/GLP Mounjaro now, A1c 7.5 Improved weight loss Complications - obesity, hyperlipidemia HTN  Plan:  1.Dose increase Mounjaro '5mg'$  up to 7.'5mg'$ , new higher dose try again 3 month, supply. See what the pharmacy says on cost / supply. Let me know if questions. Eventually we can discontinue or reduce Xigduo or switch to Metformin   2. Encourage improved lifestyle - low carb, low sugar diet, reduce portion size, continue improving regular exercise 3. Should check CBG 4. Continue ASA, ARB, not on Statin can review again  Recommend DIABETES Eye scheduling

## 2021-12-14 NOTE — Progress Notes (Signed)
Subjective:    Patient ID: Philip Dunn., male    DOB: [birth year], 66 y.o.   MRN: [id number]  Philip Bartnick. is a 66 y.o. male presenting on 12/14/2021 for Annual Exam   HPI  Here for Annual Physical and lab Review.  CHRONIC DM, Type 2: Previously A1c 9.9, on oral medication Xigduo, he was changed to [employer] previously with improved A1c to 8.5 Now recent labs showed even further improved A1c to 7.5 CBGs: Improving Meds: Mounjaro 75m weekly inj, issue with high cost $200 for monthly since out of stock for 3 month - CONTINUES on Xigduo XR 10-10039m24HR Reports good compliance. Tolerating well w/o side-effects Currently on ARB Due for DM Eye exam - [facility] 12/2021 Denies hypoglycemia, polyuria, visual changes, numbness or tingling.   CHRONIC HTN: Reports controlled BP Current Meds - Amlodipine 1063maily, Telmisartan 28m84mily   Reports good compliance, took meds today. Tolerating well, w/o complaints. Denies CP, dyspnea, HA, edema, dizziness / lightheadedness    HYPERLIPIDEMIA: - Reports no concerns. Last lipid panel 2023, reviewed with major improved cholesterol down from 166 to 118 LDL Not on statin therapy, has declined On ASA 81  Osteoarthritis Right Knee He has significant bone spurs. Prior Ortho, Dr HowaEarnestine Leysrthroscopic knee surgery 30-40 years ago.   Seasonal Allergies / Environmental He has been doing a Triamcinolone 40mg66mection once a year, he completed this recently 09/18/20 at [facility]. [facility] was completed. He has done well on this and without allergy medication instead. He takes it for Hay Fever.  Additional concerns. Left hand numbness paresthesia  Prior history of L Shoulder surgery   Health Maintenance:   Recommended Pneumonia Prevnar 20.   Shingrix # 2 and Flu Shot today   Colonoscopy completed 04/2020, next repeat 5 yr, has fam history Colon CA   PSA 0.68 (11/2021) and previously 0.76 (11/2020) negative.      09/07/2021    8:06 AM 09/17/2020    8:04 AM 10/09/2019    9:21 AM  Depression screen PHQ 2/9  Decreased Interest 0 0 0  Down, Depressed, Hopeless 0 0 0  PHQ - 2 Score 0 0 0  Altered sleeping 0 0 0  Tired, decreased energy 0 0 0  Change in appetite 0 0 0  Feeling bad or failure about yourself  0 0 0  Trouble concentrating 0 0 0  Moving slowly or fidgety/restless 0 0 0  Suicidal thoughts 0 0 0  PHQ-9 Score 0 0 0  Difficult doing work/chores Not difficult at all      Past Medical History:  Diagnosis Date   Allergy    Hyperlipidemia    Hypertension    Sleep apnea    no CPAP   Past Surgical History:  Procedure Laterality Date   COLONOSCOPY     KNEE ARTHROSCOPY Right    SHOULDER ARTHROSCOPY WITH BICEPS TENDON REPAIR Left 08/29/2016   Procedure: SHOULDER ARTHROSCOPY WITH BICEPS TENDON REPAIR;  Surgeon: KrasiThornton Park  [facility];  Laterality: Left;   SHOULDER ARTHROSCOPY WITH OPEN ROTATOR CUFF REPAIR AND DISTAL CLAVICLE ACROMINECTOMY Left 08/29/2016   Procedure: SHOULDER ARTHROSCOPY WITH OPEN ROTATOR CUFF REPAIR AND DISTAL CLAVICLE ACROMINECTOMY;  Surgeon: KrasiThornton Park  [facility];  Laterality: Left;  Sleep Apnea - No CPAP   Social History   Socioeconomic History   Marital status: Married    Spouse name: Not  on file   Number of children: Not on file   Years of education: Not on file   Highest education level: Not on file  Occupational History   Not on file  Tobacco Use   Smoking status: Former    Types: Cigarettes    Quit date: 07/28/1974    Years since quitting: 47.4   Smokeless tobacco: Never  Vaping Use   Vaping Use: Never used  Substance and Sexual Activity   Alcohol use: Yes    Alcohol/week: 2.0 standard drinks of alcohol    Types: 2 Cans of beer per week    Comment: bi weekly use per pt.   Drug use: No   Sexual activity: Not on file  Other Topics Concern   Not on  file  Social History Narrative   Not on file   Social Determinants of Health   Financial Resource Strain: Not on file  Food Insecurity: Not on file  Transportation Needs: Not on file  Physical Activity: Not on file  Stress: Not on file  Social Connections: Not on file  Intimate Partner Violence: Not on file   Family History  Problem Relation Age of Onset   Diabetes Mother    Colon cancer Mother    CAD Maternal Grandfather    Heart attack Maternal Grandfather    Cancer Maternal Grandfather    Colon cancer Maternal Grandfather    Esophageal cancer Neg Hx    Rectal cancer Neg Hx    Stomach cancer Neg Hx    Current Outpatient Medications on File Prior to Visit  Medication Sig   amLODipine (NORVASC) 10 MG tablet Take 1 tablet (10 mg total) by mouth daily.   Multiple Vitamin (MULTIVITAMIN) tablet Take 1 tablet by mouth daily.   telmisartan (MICARDIS) 80 MG tablet Take 1 tablet (80 mg total) by mouth daily.   XIGDUO XR 10-998 MG TB24 TAKE 1 TABLET BY MOUTH ONCE DAILY   No current facility-administered medications on file prior to visit.    Review of Systems  Constitutional:  Negative for activity change, appetite change, chills, diaphoresis, fatigue and fever.  HENT:  Negative for congestion and hearing loss.   Eyes:  Negative for visual disturbance.  Respiratory:  Negative for cough, chest tightness, shortness of breath and wheezing.   Cardiovascular:  Negative for chest pain, palpitations and leg swelling.  Gastrointestinal:  Negative for abdominal pain, constipation, diarrhea, nausea and vomiting.  Genitourinary:  Negative for dysuria, frequency and hematuria.  Musculoskeletal:  Negative for arthralgias and neck pain.  Skin:  Negative for rash.  Neurological:  Positive for numbness (left 1st 3 fingers). Negative for dizziness, weakness, light-headedness and headaches.  Hematological:  Negative for adenopathy.  Psychiatric/Behavioral:  Negative for behavioral problems,  dysphoric mood and sleep disturbance.    Per HPI unless specifically indicated above      Objective:    BP 124/82 (BP Location: Left Arm, Cuff Size: Normal)   Pulse (!) 102   Ht _0  (1.778 m)   Wt 197 lb 12.8 oz (89.7 kg)   SpO2 100%   BMI 28.38 kg/m   Wt Readings from Last 3 Encounters:  12/14/21 197 lb 12.8 oz (89.7 kg)  09/29/21 202 lb (91.6 kg)  09/07/21 202 lb 6.4 oz (91.8 kg)    Physical Exam Vitals and nursing note reviewed.  Constitutional:      General: He is not in acute distress.    Appearance: He is well-developed. He is not diaphoretic.  Comments: Well-appearing, comfortable, cooperative  HENT:     Head: Normocephalic and atraumatic.  Eyes:     General:        Right eye: No discharge.        Left eye: No discharge.     Conjunctiva/sclera: Conjunctivae normal.     Pupils: Pupils are equal, round, and reactive to light.  Neck:     Thyroid: No thyromegaly.  Cardiovascular:     Rate and Rhythm: Normal rate and regular rhythm.     Pulses: Normal pulses.     Heart sounds: Normal heart sounds. No murmur heard. Pulmonary:     Effort: Pulmonary effort is normal. No respiratory distress.     Breath sounds: Normal breath sounds. No wheezing or rales.  Abdominal:     General: Bowel sounds are normal. There is no distension.     Palpations: Abdomen is soft. There is no mass.     Tenderness: There is no abdominal tenderness.  Musculoskeletal:        General: No tenderness. Normal range of motion.     Cervical back: Normal range of motion and neck supple.     Comments: Upper / Lower Extremities: - Normal muscle tone, strength bilateral upper extremities 5/5, lower extremities 5/5  Lymphadenopathy:     Cervical: No cervical adenopathy.  Skin:    General: Skin is warm and dry.     Findings: No erythema or rash.  Neurological:     Mental Status: He is alert and oriented to person, place, and time.     Comments: Distal sensation intact to light touch all  extremities  Psychiatric:        Mood and Affect: Mood normal.        Behavior: Behavior normal.        Thought Content: Thought content normal.     Comments: Well groomed, good eye contact, normal speech and thoughts     Diabetic Foot Exam - Simple   Simple Foot Form Diabetic Foot exam was performed with the following findings: Yes 12/14/2021  8:44 AM  Visual Inspection See comments: Yes Sensation Testing Intact to touch and monofilament testing bilaterally: Yes Pulse Check Posterior Tibialis and Dorsalis pulse intact bilaterally: Yes Comments Left 2nd toe with joint deformity bulkiness. No significant callus or ulceration. Intact monofilament.      Results for orders placed or performed in visit on 12/06/21  TSH  Result Value Ref Range   TSH 3.62 0.40 - 4.50 mIU/L  PSA  Result Value Ref Range   PSA 0.68 < OR = 4.00 ng/mL  Hemoglobin A1c  Result Value Ref Range   Hgb A1c MFr Bld 7.5 (H) <5.7 % of total Hgb   Mean Plasma Glucose 169 mg/dL   eAG (mmol/L) 9.3 mmol/L  Lipid panel  Result Value Ref Range   Cholesterol 197 <200 mg/dL   HDL 51 > OR = 40 mg/dL   Triglycerides 163 (H) <150 mg/dL   LDL Cholesterol (Calc) 118 (H) mg/dL (calc)   Total CHOL/HDL Ratio 3.9 <5.0 (calc)   Non-HDL Cholesterol (Calc) 146 (H) <130 mg/dL (calc)  CBC with Differential/Platelet  Result Value Ref Range   WBC 4.7 3.8 - 10.8 Thousand/uL   RBC 5.63 4.20 - 5.80 Million/uL   Hemoglobin 16.9 13.2 - 17.1 g/dL   HCT 48.4 38.5 - 50.0 %   MCV 86.0 80.0 - 100.0 fL   MCH 30.0 27.0 - 33.0 pg   MCHC 34.9 32.0 - 36.0  g/dL   RDW 12.6 11.0 - 15.0 %   Platelets 193 140 - 400 Thousand/uL   MPV 10.1 7.5 - 12.5 fL   Neutro Abs 2,933 1,500 - 7,800 cells/uL   Lymphs Abs 1,278 850 - 3,900 cells/uL   Absolute Monocytes 249 200 - 950 cells/uL   Eosinophils Absolute 160 15 - 500 cells/uL   Basophils Absolute 80 0 - 200 cells/uL   Neutrophils Relative % 62.4 %   Total Lymphocyte 27.2 %   Monocytes  Relative 5.3 %   Eosinophils Relative 3.4 %   Basophils Relative 1.7 %  COMPLETE METABOLIC PANEL WITH GFR  Result Value Ref Range   Glucose, Bld 133 (H) 65 - 99 mg/dL   BUN 12 7 - 25 mg/dL   Creat 0.63 (L) 0.70 - 1.35 mg/dL   eGFR 106 > OR = 60 mL/min/1.45m   BUN/Creatinine Ratio 19 6 - 22 (calc)   Sodium 137 135 - 146 mmol/L   Potassium 4.3 3.5 - 5.3 mmol/L   Chloride 102 98 - 110 mmol/L   CO2 26 20 - 32 mmol/L   Calcium 9.2 8.6 - 10.3 mg/dL   Total Protein 6.9 6.1 - 8.1 g/dL   Albumin 4.3 3.6 - 5.1 g/dL   Globulin 2.6 1.9 - 3.7 g/dL (calc)   AG Ratio 1.7 1.0 - 2.5 (calc)   Total Bilirubin 1.1 0.2 - 1.2 mg/dL   Alkaline phosphatase (APISO) 59 35 - 144 U/L   AST 14 10 - 35 U/L   ALT 16 9 - 46 U/L      Assessment & Plan:   Problem List Items Addressed This Visit     Hyperlipidemia due to type 2 diabetes mellitus (HCC)    Improved LDL 118 The 10-year ASCVD risk score (Arnett DK, et al., 2019) is: 24.8% Will review cardiovascular risk and indications at next visit with new results - he is hesitant about statin therapy.  ASA as discussed for ASCVD risk reduction.  Goal to initiate low dose statin therapy in future, will reconsider  Encourage improved lifestyle - low carb/cholesterol, reduce portion size, continue improving regular exercise      Relevant Medications   MOUNJARO 7.5 MG/0.5ML Pen   sildenafil (REVATIO) 20 MG tablet   Hypertension    Well-controlled HTN - Home BP readings reviewed  No known complications     Plan:  1. Continue current BP regimen Amlodipine 112mand Telmisartan 8057maily 2. Encourage improved lifestyle - low sodium diet, regular exercise 3. Continue monitor BP outside office, bring readings to next visit, if persistently >140/90 or new symptoms notify office sooner      Relevant Medications   sildenafil (REVATIO) 20 MG tablet   Left carpal tunnel syndrome   Type 2 diabetes mellitus with other specified complication (HCC)     Significant improvement on GIP/GLP Mounjaro now, A1c 7.5 Improved weight loss Complications - obesity, hyperlipidemia HTN  Plan:  1.Dose increase Mounjaro 5mg44m to 7.5mg,55mw higher dose try again 3 month, supply. See what the pharmacy says on cost / supply. Let me know if questions. Eventually we can discontinue or reduce Xigduo or switch to Metformin   2. Encourage improved lifestyle - low carb, low sugar diet, reduce portion size, continue improving regular exercise 3. Should check CBG 4. Continue ASA, ARB, not on Statin can review again  Recommend DIABETES Eye scheduling      Relevant Medications   MOUNJARO 7.5 MG/0.5ML Pen   Other Relevant  Orders   Urine Microalbumin w/creat. ratio   Other Visit Diagnoses     Annual physical exam    -  Primary   Need for shingles vaccine       Relevant Orders   Zoster Recombinant (Shingrix ) (Completed)   Needs flu shot       Relevant Orders   Flu Vaccine QUAD High Dose(Fluad) (Completed)   Erectile dysfunction, unspecified erectile dysfunction type       Relevant Medications   sildenafil (REVATIO) 20 MG tablet       Updated Health Maintenance information Reviewed recent lab results with patient Encouraged improvement to lifestyle with diet and exercise Goal of weight loss  Flu Shot and Shingrix # 2 today  Prevnar-20 pneumonia vaccine age 52+ 1 dose and done. Can do here or at pharmacy, after age 56.   ED, likely secondary multifactorial Start Sildenafil 3m dose titration as discussed 1-5 pills per dose. Goodrx Follow up if not working or new concerns we can refer you to a Urologist.  Carpal Tunnel, L side with neuropathy of 1st 3 fingers, median nerve Recommend Wear a wrist splint/brace for carpal tunnel at night while sleeping START anti inflammatory topical - OTC Voltaren (generic Diclofenac) topical 2-4 times a day as needed for pain swelling of affected joint for 1-2 weeks or longer. Tylenol AS NEEDED If need we can  - Referral to Neurologist - for Nerve Conduction Study and also consultation to discuss test results and treatment options   Orders Placed This Encounter  Procedures   Zoster Recombinant (Shingrix )   Flu Vaccine QUAD High Dose(Fluad)   Urine Microalbumin w/creat. ratio    Meds ordered this encounter  Medications   MOUNJARO 7.5 MG/0.5ML Pen    Sig: Inject 7.5 mg into the skin once a week.    Dispense:  6 mL    Refill:  1    Dose increase from 5 to 7.566m  sildenafil (REVATIO) 20 MG tablet    Sig: Take 1-5 pills about 30 min prior to sex. Start with 1 and increase as needed.    Dispense:  90 tablet    Refill:  3      Follow up plan: Return in about 6 months (around 06/14/2022) for 6 month DM A1c.  AlNobie PutnamDO [facility] 12/14/2021, 8:40 AM

## 2021-12-15 LAB — MICROALBUMIN / CREATININE URINE RATIO
Creatinine, Urine: 52 mg/dL (ref 20–320)
Microalb Creat Ratio: 8 mcg/mg creat (ref <30)
Microalb, Ur: 0.4 mg/dL

## 2021-12-22 DIAGNOSIS — M9905 Segmental and somatic dysfunction of pelvic region: Secondary | ICD-10-CM | POA: Diagnosis not present

## 2021-12-22 DIAGNOSIS — M9904 Segmental and somatic dysfunction of sacral region: Secondary | ICD-10-CM | POA: Diagnosis not present

## 2021-12-22 DIAGNOSIS — M9903 Segmental and somatic dysfunction of lumbar region: Secondary | ICD-10-CM | POA: Diagnosis not present

## 2021-12-22 DIAGNOSIS — M545 Low back pain, unspecified: Secondary | ICD-10-CM | POA: Diagnosis not present

## 2022-01-04 DIAGNOSIS — Z83511 Family history of glaucoma: Secondary | ICD-10-CM | POA: Diagnosis not present

## 2022-01-04 DIAGNOSIS — H52223 Regular astigmatism, bilateral: Secondary | ICD-10-CM | POA: Diagnosis not present

## 2022-01-04 DIAGNOSIS — H524 Presbyopia: Secondary | ICD-10-CM | POA: Diagnosis not present

## 2022-01-04 DIAGNOSIS — H40023 Open angle with borderline findings, high risk, bilateral: Secondary | ICD-10-CM | POA: Diagnosis not present

## 2022-01-04 DIAGNOSIS — E119 Type 2 diabetes mellitus without complications: Secondary | ICD-10-CM | POA: Diagnosis not present

## 2022-01-04 DIAGNOSIS — H35412 Lattice degeneration of retina, left eye: Secondary | ICD-10-CM | POA: Diagnosis not present

## 2022-01-04 DIAGNOSIS — H11052 Peripheral pterygium, progressive, left eye: Secondary | ICD-10-CM | POA: Diagnosis not present

## 2022-01-04 LAB — HM DIABETES EYE EXAM

## 2022-01-19 DIAGNOSIS — M9904 Segmental and somatic dysfunction of sacral region: Secondary | ICD-10-CM | POA: Diagnosis not present

## 2022-01-19 DIAGNOSIS — M9905 Segmental and somatic dysfunction of pelvic region: Secondary | ICD-10-CM | POA: Diagnosis not present

## 2022-01-19 DIAGNOSIS — M9903 Segmental and somatic dysfunction of lumbar region: Secondary | ICD-10-CM | POA: Diagnosis not present

## 2022-01-19 DIAGNOSIS — M545 Low back pain, unspecified: Secondary | ICD-10-CM | POA: Diagnosis not present

## 2022-01-28 ENCOUNTER — Telehealth: Payer: Self-pay | Admitting: Family Medicine

## 2022-01-28 NOTE — Telephone Encounter (Signed)
Spoke with patient and at this time he declines AWV due to work schedule, he can not do a Friday appointment. Advised patient to call back and schedule the Medicare Annual Wellness Visit (AWV) virtually or by telephone when he was available.     Please schedule at anytime with [facility].    Any questions, please call me at [phone]

## 2022-02-23 DIAGNOSIS — M9903 Segmental and somatic dysfunction of lumbar region: Secondary | ICD-10-CM | POA: Diagnosis not present

## 2022-02-23 DIAGNOSIS — M9904 Segmental and somatic dysfunction of sacral region: Secondary | ICD-10-CM | POA: Diagnosis not present

## 2022-02-23 DIAGNOSIS — M545 Low back pain, unspecified: Secondary | ICD-10-CM | POA: Diagnosis not present

## 2022-02-23 DIAGNOSIS — M9905 Segmental and somatic dysfunction of pelvic region: Secondary | ICD-10-CM | POA: Diagnosis not present

## 2022-02-25 ENCOUNTER — Other Ambulatory Visit: Payer: Self-pay | Admitting: Family Medicine

## 2022-02-25 DIAGNOSIS — E1169 Type 2 diabetes mellitus with other specified complication: Secondary | ICD-10-CM

## 2022-02-25 MED ORDER — MOUNJARO 7.5 MG/0.5ML ~~LOC~~ SOAJ: 7.5000 mg | SUBCUTANEOUS | 1 refills | Status: DC

## 2022-02-25 NOTE — Telephone Encounter (Signed)
Medication Refill - Medication: MOUNJARO 7.5 MG/0.5ML Pen   Has the patient contacted their pharmacy? Yes.   Pt told to contact provider  Preferred Pharmacy (with phone number or street name):  [place], [place] - [address] Phone: [phone]          Has the patient been seen for an appointment in the last year OR does the patient have an upcoming appointment? Yes.    Agent: Please be advised that RX refills may take up to 3 business days. We ask that you follow-up with your pharmacy.

## 2022-02-25 NOTE — Telephone Encounter (Signed)
Requested medications are due for refill today.  Provider to determine  Requested medications are on the active medications list.  yes  Last refill. 12/14/2021 54m 1 rf  Future visit scheduled.   yes  Notes to clinic.  Medication not assigned a protocol. Please review for refill.    Requested Prescriptions  Pending Prescriptions Disp Refills   MOUNJARO 7.5 MG/0.5ML Pen 6 mL 1    Sig: Inject 7.5 mg into the skin once a week.     Off-Protocol Failed - 02/25/2022 11:11 AM      Failed - Medication not assigned to a protocol, review manually.      Passed - Valid encounter within last 12 months    Recent Outpatient Visits           2 months ago [facility]   5 months ago Type 2 diabetes mellitus with other specified complication, without long-term current use of insulin ([facility]   [place]   8 months ago Type 2 diabetes mellitus with other specified complication, without long-term current use of insulin ([facility]   [place]   1 year [facility]   1 year ago Type 2 diabetes mellitus with other specified complication, without long-term current use of insulin ([facility]   [place]       Future Appointments             In 3 months [place] [facility]ought[facility]

## 2022-03-23 DIAGNOSIS — M9904 Segmental and somatic dysfunction of sacral region: Secondary | ICD-10-CM | POA: Diagnosis not present

## 2022-03-23 DIAGNOSIS — M9903 Segmental and somatic dysfunction of lumbar region: Secondary | ICD-10-CM | POA: Diagnosis not present

## 2022-03-23 DIAGNOSIS — M9905 Segmental and somatic dysfunction of pelvic region: Secondary | ICD-10-CM | POA: Diagnosis not present

## 2022-03-23 DIAGNOSIS — M545 Low back pain, unspecified: Secondary | ICD-10-CM | POA: Diagnosis not present

## 2022-03-28 ENCOUNTER — Encounter: Payer: Self-pay | Admitting: Family Medicine

## 2022-03-28 DIAGNOSIS — M75101 Unspecified rotator cuff tear or rupture of right shoulder, not specified as traumatic: Secondary | ICD-10-CM

## 2022-03-28 DIAGNOSIS — G8929 Other chronic pain: Secondary | ICD-10-CM

## 2022-04-01 DIAGNOSIS — M25511 Pain in right shoulder: Secondary | ICD-10-CM | POA: Diagnosis not present

## 2022-04-01 DIAGNOSIS — M19012 Primary osteoarthritis, left shoulder: Secondary | ICD-10-CM | POA: Diagnosis not present

## 2022-04-01 DIAGNOSIS — G5602 Carpal tunnel syndrome, left upper limb: Secondary | ICD-10-CM | POA: Diagnosis not present

## 2022-04-05 ENCOUNTER — Other Ambulatory Visit: Payer: Self-pay | Admitting: Family Medicine

## 2022-04-05 DIAGNOSIS — M25511 Pain in right shoulder: Secondary | ICD-10-CM | POA: Diagnosis not present

## 2022-04-05 DIAGNOSIS — E1169 Type 2 diabetes mellitus with other specified complication: Secondary | ICD-10-CM

## 2022-04-06 NOTE — Telephone Encounter (Signed)
Requested Prescriptions  Pending Prescriptions Disp Refills   XIGDUO XR 10-998 MG TB24 [Pharmacy Med Name: XIGDUO XR 10-998 MG TAB] 180 tablet 0    Sig: TAKE 1 TABLET BY MOUTH ONCE DAILY     Endocrinology:  Diabetes - Biguanide + SGLT2 Inhibitor Combos Failed - 04/05/2022  2:14 PM      Failed - Cr in normal range and within 360 days    Creat  Date Value Ref Range Status  12/07/2021 0.63 (L) 0.70 - 1.35 mg/dL Final   Creatinine, Urine  Date Value Ref Range Status  12/14/2021 52 20 - 320 mg/dL Final         Failed - B12 Level in normal range and within 720 days    No results found for: "VITAMINB12"       Passed - HBA1C is between 0 and 7.9 and within 180 days    Hemoglobin A1C  Date Value Ref Range Status  09/08/2015 7.8  Final   HB A1C (BAYER DCA - WAIVED)  Date Value Ref Range Status  10/09/2019 8.7 (H) <7.0 % Final    Comment:                                          Diabetic Adult            <7.0                                       Healthy Adult        4.3 - 5.7                                                           (DCCT/NGSP) American Diabetes Association's Summary of Glycemic Recommendations for Adults with Diabetes: Hemoglobin A1c <7.0%. More stringent glycemic goals (A1c <6.0%) may further reduce complications at the cost of increased risk of hypoglycemia.    Hgb A1c MFr Bld  Date Value Ref Range Status  12/07/2021 7.5 (H) <5.7 % of total Hgb Final    Comment:    For someone without known diabetes, a hemoglobin A1c value of 6.5% or greater indicates that they may have  diabetes and this should be confirmed with a follow-up  test. . For someone with known diabetes, a value <7% indicates  that their diabetes is well controlled and a value  greater than or equal to 7% indicates suboptimal  control. A1c targets should be individualized based on  duration of diabetes, age, comorbid conditions, and  other considerations. . Currently, no consensus exists  regarding use of hemoglobin A1c for diagnosis of diabetes for children. .          Passed - eGFR in normal range and within 360 days    GFR calc Af Amer  Date Value Ref Range Status  10/09/2019 114 >59 mL/min/1.73 Final    Comment:    **[facility] currently reports eGFR in compliance with the current**   recommendations of the [employer]. [facility] will   update reporting as new guidelines are published from the NKF-ASN   Task force.    GFR  calc non Af Amer  Date Value Ref Range Status  10/09/2019 99 >59 mL/min/1.73 Final   eGFR  Date Value Ref Range Status  12/07/2021 106 > OR = 60 mL/min/1.43m2 Final         Passed - Valid encounter within last 6 months    Recent Outpatient Visits           3 months ago Annual physical exam   [facility] [place], Devonne Doughty, DO   7 months ago Type 2 diabetes mellitus with other specified complication, without long-term current use of insulin ([place])   [facility] [place], Devonne Doughty, DO   10 months ago Type 2 diabetes mellitus with other specified complication, without long-term current use of insulin ([place])   [place], DO   1 year ago Annual physical exam   [facility] O[place] Hauser, DO   1 year ago Type 2 diabetes mellitus with other specified complication, without long-term current use of insulin ([place])   [place], DO       Future Appointments             In 2 months [place], Devonne Doughty, DO [facility] - CBC within normal limits and completed in the last 12 months    WBC  Date Value Ref Range Status  12/07/2021 4.7 3.8 - 10.8 Thousand/uL Final   RBC  Date Value Ref Range Status  12/07/2021 5.63 4.20 - 5.80 Million/uL Final   Hemoglobin  Date  Value Ref Range Status  12/07/2021 16.9 13.2 - 17.1 g/dL Final  10/09/2019 16.4 13.0 - 17.7 g/dL Final   HCT  Date Value Ref Range Status  12/07/2021 48.4 38.5 - 50.0 % Final   Hematocrit  Date Value Ref Range Status  10/09/2019 49.7 37.5 - 51.0 % Final   MCHC  Date Value Ref Range Status  12/07/2021 34.9 32.0 - 36.0 g/dL Final   [facility]  Date Value Ref Range Status  12/07/2021 30.0 27.0 - 33.0 pg Final   MCV  Date Value Ref Range Status  12/07/2021 86.0 80.0 - 100.0 fL Final  10/09/2019 90 79 - 97 fL Final   No results found for: "PLTCOUNTKUC", "LABPLAT", "POCPLA" RDW  Date Value Ref Range Status  12/07/2021 12.6 11.0 - 15.0 % Final  10/09/2019 13.0 11.6 - 15.4 % Final

## 2022-04-13 DIAGNOSIS — M19019 Primary osteoarthritis, unspecified shoulder: Secondary | ICD-10-CM | POA: Diagnosis not present

## 2022-04-13 DIAGNOSIS — M75122 Complete rotator cuff tear or rupture of left shoulder, not specified as traumatic: Secondary | ICD-10-CM | POA: Diagnosis not present

## 2022-04-18 DIAGNOSIS — G5602 Carpal tunnel syndrome, left upper limb: Secondary | ICD-10-CM

## 2022-04-18 HISTORY — DX: Carpal tunnel syndrome, left upper limb: G56.02

## 2022-04-20 DIAGNOSIS — M9903 Segmental and somatic dysfunction of lumbar region: Secondary | ICD-10-CM | POA: Diagnosis not present

## 2022-04-20 DIAGNOSIS — M9904 Segmental and somatic dysfunction of sacral region: Secondary | ICD-10-CM | POA: Diagnosis not present

## 2022-04-20 DIAGNOSIS — M9905 Segmental and somatic dysfunction of pelvic region: Secondary | ICD-10-CM | POA: Diagnosis not present

## 2022-04-20 DIAGNOSIS — M545 Low back pain, unspecified: Secondary | ICD-10-CM | POA: Diagnosis not present

## 2022-04-25 DIAGNOSIS — M75122 Complete rotator cuff tear or rupture of left shoulder, not specified as traumatic: Secondary | ICD-10-CM | POA: Diagnosis not present

## 2022-04-28 DIAGNOSIS — G5602 Carpal tunnel syndrome, left upper limb: Secondary | ICD-10-CM | POA: Diagnosis not present

## 2022-05-02 DIAGNOSIS — M75122 Complete rotator cuff tear or rupture of left shoulder, not specified as traumatic: Secondary | ICD-10-CM | POA: Diagnosis not present

## 2022-05-18 DIAGNOSIS — M9905 Segmental and somatic dysfunction of pelvic region: Secondary | ICD-10-CM | POA: Diagnosis not present

## 2022-05-18 DIAGNOSIS — M9903 Segmental and somatic dysfunction of lumbar region: Secondary | ICD-10-CM | POA: Diagnosis not present

## 2022-05-18 DIAGNOSIS — M545 Low back pain, unspecified: Secondary | ICD-10-CM | POA: Diagnosis not present

## 2022-05-18 DIAGNOSIS — M9904 Segmental and somatic dysfunction of sacral region: Secondary | ICD-10-CM | POA: Diagnosis not present

## 2022-05-23 DIAGNOSIS — M9904 Segmental and somatic dysfunction of sacral region: Secondary | ICD-10-CM | POA: Diagnosis not present

## 2022-05-23 DIAGNOSIS — M9905 Segmental and somatic dysfunction of pelvic region: Secondary | ICD-10-CM | POA: Diagnosis not present

## 2022-05-23 DIAGNOSIS — M9903 Segmental and somatic dysfunction of lumbar region: Secondary | ICD-10-CM | POA: Diagnosis not present

## 2022-05-23 DIAGNOSIS — M545 Low back pain, unspecified: Secondary | ICD-10-CM | POA: Diagnosis not present

## 2022-05-25 ENCOUNTER — Encounter
Admission: RE | Admit: 2022-05-25 | Discharge: 2022-05-25 | Disposition: A | Discharge status: Home / Self Care | Payer: PPO | Source: Ambulatory Visit | Attending: Orthopedic Surgery | Admitting: Orthopedic Surgery

## 2022-05-25 ENCOUNTER — Other Ambulatory Visit: Payer: Self-pay | Admitting: Orthopedic Surgery

## 2022-05-25 VITALS — Ht 70.0 in | Wt 200.0 lb

## 2022-05-25 DIAGNOSIS — I10 Essential (primary) hypertension: Secondary | ICD-10-CM | POA: Insufficient documentation

## 2022-05-25 DIAGNOSIS — M9903 Segmental and somatic dysfunction of lumbar region: Secondary | ICD-10-CM | POA: Diagnosis not present

## 2022-05-25 DIAGNOSIS — E785 Hyperlipidemia, unspecified: Secondary | ICD-10-CM | POA: Diagnosis not present

## 2022-05-25 DIAGNOSIS — Z01818 Encounter for other preprocedural examination: Secondary | ICD-10-CM | POA: Insufficient documentation

## 2022-05-25 DIAGNOSIS — Z01810 Encounter for preprocedural cardiovascular examination: Secondary | ICD-10-CM | POA: Diagnosis not present

## 2022-05-25 DIAGNOSIS — Z01812 Encounter for preprocedural laboratory examination: Secondary | ICD-10-CM

## 2022-05-25 DIAGNOSIS — E1169 Type 2 diabetes mellitus with other specified complication: Secondary | ICD-10-CM

## 2022-05-25 DIAGNOSIS — M9905 Segmental and somatic dysfunction of pelvic region: Secondary | ICD-10-CM | POA: Diagnosis not present

## 2022-05-25 DIAGNOSIS — M545 Low back pain, unspecified: Secondary | ICD-10-CM | POA: Diagnosis not present

## 2022-05-25 DIAGNOSIS — M9904 Segmental and somatic dysfunction of sacral region: Secondary | ICD-10-CM | POA: Diagnosis not present

## 2022-05-25 HISTORY — DX: Type 2 diabetes mellitus with unspecified complications: E11.8

## 2022-05-25 LAB — CBC
HCT: 49.1 % (ref 39.0–52.0)
Hemoglobin: 16.4 g/dL (ref 13.0–17.0)
MCH: 29.1 pg (ref 26.0–34.0)
MCHC: 33.4 g/dL (ref 30.0–36.0)
MCV: 87.2 fL (ref 80.0–100.0)
Platelets: 223 10*3/uL (ref 150–400)
RBC: 5.63 MIL/uL (ref 4.22–5.81)
RDW: 12 % (ref 11.5–15.5)
WBC: 6.2 10*3/uL (ref 4.0–10.5)
nRBC: 0 % (ref 0.0–0.2)

## 2022-05-25 LAB — BASIC METABOLIC PANEL
Anion gap: 9 (ref 5–15)
BUN: 14 mg/dL (ref 8–23)
CO2: 24 mmol/L (ref 22–32)
Calcium: 9.3 mg/dL (ref 8.9–10.3)
Chloride: 104 mmol/L (ref 98–111)
Creatinine, Ser: 0.84 mg/dL (ref 0.61–1.24)
GFR, Estimated: >60 mL/min (ref >60)
Glucose, Bld: 133 mg/dL — ABNORMAL HIGH (ref 70–99)
Potassium: 3.8 mmol/L (ref 3.5–5.1)
Sodium: 137 mmol/L (ref 135–145)

## 2022-05-25 LAB — HEMOGLOBIN A1C
Hgb A1c MFr Bld: 6.9 % — ABNORMAL HIGH (ref 4.8–5.6)
Mean Plasma Glucose: 151.33 mg/dL

## 2022-05-25 NOTE — Patient Instructions (Addendum)
Your procedure is scheduled on: Thursday, May 16 Report to the Registration Desk on the 1st floor of the [facility] To find out your arrival time, please call [phone] between 1PM - 3PM on: Wednesday, May 15 If your arrival time is 6:00 am, do not arrive before that time as the [facility] entrance doors do not open until 6:00 am.  REMEMBER: Instructions that are not followed completely may result in serious medical risk, up to and including death; or upon the discretion of your surgeon and anesthesiologist your surgery may need to be rescheduled.  Do not eat or drink after midnight the night before surgery.  No gum chewing or hard candies.  One week prior to surgery: starting May 9 Stop Anti-inflammatories (NSAIDS) such as Advil, Aleve, Ibuprofen, Motrin, Naproxen, Naprosyn and Aspirin based products such as Excedrin, Goody's Powder, BC Powder. Stop ANY OVER THE COUNTER supplements until after surgery. Stop multiple vitamins. You may however, continue to take Tylenol if needed for pain up until the day of surgery.  Continue taking all prescribed medications with the exception of the following:  Mounjaro - hold for 7 days before surgery. Do not take on Monday, May 13. Resume on your regular day, Monday, May 20.  Xigduo - hold for 2 days before surgery. Last day to take is Monday, May 13. Resume AFTER surgery.  Sildenafil - do not take for at least 2 days before surgery.  TAKE ONLY THESE MEDICATIONS THE MORNING OF SURGERY WITH A SIP OF WATER:  Amlodipine  No Alcohol for 24 hours before or after surgery.  No Smoking including e-cigarettes for 24 hours before surgery.  No chewable tobacco products for at least 6 hours before surgery.  No nicotine patches on the day of surgery.  Do not use any "recreational" drugs for at least a week (preferably 2 weeks) before your surgery.  Please be advised that the combination of cocaine and anesthesia may have negative outcomes, up to  and including death. If you test positive for cocaine, your surgery will be cancelled.  On the morning of surgery brush your teeth with toothpaste and water, you may rinse your mouth with mouthwash if you wish. Do not swallow any toothpaste or mouthwash.  Use CHG Soap as directed on instruction sheet.  Do not wear jewelry, make-up, hairpins, clips or nail polish.  Do not wear lotions, powders, or perfumes.   Do not shave body hair from the neck down 48 hours before surgery.  Contact lenses, hearing aids and dentures may not be worn into surgery.  Do not bring valuables to the hospital. [facility] is not responsible for any missing/lost belongings or valuables.   Notify your doctor if there is any change in your medical condition (cold, fever, infection).  Wear comfortable clothing (specific to your surgery type) to the hospital.  After surgery, you can help prevent lung complications by doing breathing exercises.  Take deep breaths and cough every 1-2 hours. Your doctor may order a device called an Incentive Spirometer to help you take deep breaths.  If you are being discharged the day of surgery, you will not be allowed to drive home. You will need a responsible individual to drive you home and stay with you for 24 hours after surgery.   If you are taking public transportation, you will need to have a responsible individual with you.  Please call the Pre-admissions Testing Dept. at [phone] if you have any questions about these instructions.  Surgery Visitation Policy:  Patients having surgery or a procedure may have two visitors.  Children under the age of 63 must have an adult with them who is not the patient.     Preparing for Surgery with CHLORHEXIDINE GLUCONATE (CHG) Soap  Chlorhexidine Gluconate (CHG) Soap  o An antiseptic cleaner that kills germs and bonds with the skin to continue killing germs even after washing  o Used for showering the night before  surgery and morning of surgery  Before surgery, you can play an important role by reducing the number of germs on your skin.  CHG (Chlorhexidine gluconate) soap is an antiseptic cleanser which kills germs and bonds with the skin to continue killing germs even after washing.  Please do not use if you have an allergy to CHG or antibacterial soaps. If your skin becomes reddened/irritated stop using the CHG.  1. Shower the NIGHT BEFORE SURGERY and the MORNING OF SURGERY with CHG soap.  2. If you choose to wash your hair, wash your hair first as usual with your normal shampoo.  3. After shampooing, rinse your hair and body thoroughly to remove the shampoo.  4. Use CHG as you would any other liquid soap. You can apply CHG directly to the skin and wash gently with a scrungie or a clean washcloth.  5. Apply the CHG soap to your body only from the neck down. Do not use on open wounds or open sores. Avoid contact with your eyes, ears, mouth, and genitals (private parts). Wash face and genitals (private parts) with your normal soap.  6. Wash thoroughly, paying special attention to the area where your surgery will be performed.  7. Thoroughly rinse your body with warm water.  8. Do not shower/wash with your normal soap after using and rinsing off the CHG soap.  9. Pat yourself dry with a clean towel.  10. Wear clean pajamas to bed the night before surgery.  12. Place clean sheets on your bed the night of your first shower and do not sleep with pets.  13. Shower again with the CHG soap on the day of surgery prior to arriving at the hospital.  14. Do not apply any deodorants/lotions/powders.  15. Please wear clean clothes to the hospital.

## 2022-05-27 DIAGNOSIS — M9903 Segmental and somatic dysfunction of lumbar region: Secondary | ICD-10-CM | POA: Diagnosis not present

## 2022-05-27 DIAGNOSIS — M545 Low back pain, unspecified: Secondary | ICD-10-CM | POA: Diagnosis not present

## 2022-05-27 DIAGNOSIS — M9904 Segmental and somatic dysfunction of sacral region: Secondary | ICD-10-CM | POA: Diagnosis not present

## 2022-05-27 DIAGNOSIS — M9905 Segmental and somatic dysfunction of pelvic region: Secondary | ICD-10-CM | POA: Diagnosis not present

## 2022-06-02 ENCOUNTER — Ambulatory Visit
Admission: RE | Admit: 2022-06-02 | Discharge: 2022-06-02 | Disposition: A | Discharge status: Home / Self Care | Payer: PPO | Attending: Orthopedic Surgery | Admitting: Orthopedic Surgery

## 2022-06-02 ENCOUNTER — Encounter
Admission: RE | Disposition: A | Discharge status: Home / Self Care | Payer: Self-pay | Source: Home / Self Care | Attending: Orthopedic Surgery

## 2022-06-02 ENCOUNTER — Ambulatory Visit: Payer: PPO | Admitting: Urgent Care

## 2022-06-02 ENCOUNTER — Ambulatory Visit: Payer: PPO | Admitting: Certified Registered Nurse Anesthetist

## 2022-06-02 ENCOUNTER — Other Ambulatory Visit: Payer: Self-pay

## 2022-06-02 ENCOUNTER — Encounter: Payer: Self-pay | Admitting: Orthopedic Surgery

## 2022-06-02 DIAGNOSIS — E119 Type 2 diabetes mellitus without complications: Secondary | ICD-10-CM | POA: Insufficient documentation

## 2022-06-02 DIAGNOSIS — Z87891 Personal history of nicotine dependence: Secondary | ICD-10-CM | POA: Diagnosis not present

## 2022-06-02 DIAGNOSIS — M1990 Unspecified osteoarthritis, unspecified site: Secondary | ICD-10-CM | POA: Insufficient documentation

## 2022-06-02 DIAGNOSIS — Z01812 Encounter for preprocedural laboratory examination: Secondary | ICD-10-CM

## 2022-06-02 DIAGNOSIS — G5602 Carpal tunnel syndrome, left upper limb: Secondary | ICD-10-CM | POA: Diagnosis not present

## 2022-06-02 DIAGNOSIS — I10 Essential (primary) hypertension: Secondary | ICD-10-CM | POA: Diagnosis not present

## 2022-06-02 DIAGNOSIS — E1169 Type 2 diabetes mellitus with other specified complication: Secondary | ICD-10-CM

## 2022-06-02 DIAGNOSIS — E785 Hyperlipidemia, unspecified: Secondary | ICD-10-CM | POA: Insufficient documentation

## 2022-06-02 DIAGNOSIS — G4730 Sleep apnea, unspecified: Secondary | ICD-10-CM | POA: Diagnosis not present

## 2022-06-02 HISTORY — PX: CARPAL TUNNEL RELEASE: SHX101

## 2022-06-02 LAB — GLUCOSE, CAPILLARY: Glucose-Capillary: 166 mg/dL — ABNORMAL HIGH (ref 70–99)

## 2022-06-02 SURGERY — CARPAL TUNNEL RELEASE
Anesthesia: General | Laterality: Left

## 2022-06-02 MED ORDER — EPHEDRINE SULFATE (PRESSORS) 50 MG/ML IJ SOLN
INTRAMUSCULAR | Status: DC | PRN
  Administered 2022-06-02: 10 mg via INTRAVENOUS

## 2022-06-02 MED ORDER — ONDANSETRON HCL 4 MG/2ML IJ SOLN: 4.0000 mg | Freq: Once | INTRAMUSCULAR | Status: DC | PRN

## 2022-06-02 MED ORDER — OXYCODONE HCL 5 MG/5ML PO SOLN: 5.0000 mg | Freq: Once | ORAL | Status: AC | PRN

## 2022-06-02 MED ORDER — FENTANYL CITRATE (PF) 100 MCG/2ML IJ SOLN
INTRAMUSCULAR | Status: DC | PRN
  Administered 2022-06-02 (×2): 50 ug via INTRAVENOUS

## 2022-06-02 MED ORDER — ONDANSETRON HCL 4 MG PO TABS: 4.0000 mg | ORAL_TABLET | Freq: Three times a day (TID) | ORAL | 0 refills | Status: DC | PRN

## 2022-06-02 MED ORDER — FENTANYL CITRATE (PF) 100 MCG/2ML IJ SOLN
INTRAMUSCULAR | Status: AC
  Filled 2022-06-02: qty 2

## 2022-06-02 MED ORDER — PROPOFOL 10 MG/ML IV BOLUS
INTRAVENOUS | Status: DC | PRN
  Administered 2022-06-02: 200 mg via INTRAVENOUS

## 2022-06-02 MED ORDER — PHENYLEPHRINE 80 MCG/ML (10ML) SYRINGE FOR IV PUSH (FOR BLOOD PRESSURE SUPPORT)
PREFILLED_SYRINGE | INTRAVENOUS | Status: AC
  Filled 2022-06-02: qty 10

## 2022-06-02 MED ORDER — PROPOFOL 10 MG/ML IV BOLUS
INTRAVENOUS | Status: AC
  Filled 2022-06-02: qty 20

## 2022-06-02 MED ORDER — CHLORHEXIDINE GLUCONATE CLOTH 2 % EX PADS: 6.0000 | MEDICATED_PAD | Freq: Once | CUTANEOUS | Status: DC

## 2022-06-02 MED ORDER — FAMOTIDINE 20 MG PO TABS
20.0000 mg | ORAL_TABLET | Freq: Once | ORAL | Status: AC
  Administered 2022-06-02: 20 mg via ORAL

## 2022-06-02 MED ORDER — ONDANSETRON HCL 4 MG/2ML IJ SOLN
INTRAMUSCULAR | Status: DC | PRN
  Administered 2022-06-02: 4 mg via INTRAVENOUS

## 2022-06-02 MED ORDER — OXYCODONE HCL 5 MG PO TABS
ORAL_TABLET | ORAL | Status: AC
  Filled 2022-06-02: qty 1

## 2022-06-02 MED ORDER — DEXMEDETOMIDINE HCL IN NACL 80 MCG/20ML IV SOLN
INTRAVENOUS | Status: DC | PRN
  Administered 2022-06-02: 8 ug via INTRAVENOUS
  Administered 2022-06-02: 4 ug via INTRAVENOUS

## 2022-06-02 MED ORDER — ORAL CARE MOUTH RINSE: 15.0000 mL | Freq: Once | OROMUCOSAL | Status: AC

## 2022-06-02 MED ORDER — LIDOCAINE HCL (PF) 2 % IJ SOLN
INTRAMUSCULAR | Status: AC
  Filled 2022-06-02: qty 5

## 2022-06-02 MED ORDER — SODIUM CHLORIDE 0.9 % IV SOLN: INTRAVENOUS | Status: DC

## 2022-06-02 MED ORDER — CEFAZOLIN SODIUM-DEXTROSE 2-4 GM/100ML-% IV SOLN
INTRAVENOUS | Status: AC
  Filled 2022-06-02: qty 100

## 2022-06-02 MED ORDER — FENTANYL CITRATE (PF) 100 MCG/2ML IJ SOLN
25.0000 ug | INTRAMUSCULAR | Status: DC | PRN
  Administered 2022-06-02 (×4): 25 ug via INTRAVENOUS

## 2022-06-02 MED ORDER — OXYCODONE HCL 5 MG PO TABS
5.0000 mg | ORAL_TABLET | Freq: Once | ORAL | Status: AC | PRN
  Administered 2022-06-02: 5 mg via ORAL

## 2022-06-02 MED ORDER — ACETAMINOPHEN 500 MG PO TABS
1000.0000 mg | ORAL_TABLET | ORAL | Status: AC
  Administered 2022-06-02: 1000 mg via ORAL

## 2022-06-02 MED ORDER — LIDOCAINE HCL (CARDIAC) PF 100 MG/5ML IV SOSY
PREFILLED_SYRINGE | INTRAVENOUS | Status: DC | PRN
  Administered 2022-06-02: 100 mg via INTRAVENOUS

## 2022-06-02 MED ORDER — HYDROCODONE-ACETAMINOPHEN 5-325 MG PO TABS: 1.0000 | ORAL_TABLET | ORAL | 0 refills | Status: DC | PRN

## 2022-06-02 MED ORDER — PHENYLEPHRINE HCL (PRESSORS) 10 MG/ML IV SOLN
INTRAVENOUS | Status: DC | PRN
  Administered 2022-06-02 (×3): 160 ug via INTRAVENOUS
  Administered 2022-06-02: 80 ug via INTRAVENOUS

## 2022-06-02 MED ORDER — DEXAMETHASONE SODIUM PHOSPHATE 10 MG/ML IJ SOLN
INTRAMUSCULAR | Status: AC
  Filled 2022-06-02: qty 1

## 2022-06-02 MED ORDER — CHLORHEXIDINE GLUCONATE CLOTH 2 % EX PADS
6.0000 | MEDICATED_PAD | Freq: Once | CUTANEOUS | Status: AC
  Administered 2022-06-02: 6 via TOPICAL

## 2022-06-02 MED ORDER — CEFAZOLIN SODIUM-DEXTROSE 2-4 GM/100ML-% IV SOLN
2.0000 g | INTRAVENOUS | Status: AC
  Administered 2022-06-02: 2 g via INTRAVENOUS

## 2022-06-02 MED ORDER — DEXAMETHASONE SODIUM PHOSPHATE 10 MG/ML IJ SOLN
INTRAMUSCULAR | Status: DC | PRN
  Administered 2022-06-02: 10 mg via INTRAVENOUS

## 2022-06-02 MED ORDER — ACETAMINOPHEN 500 MG PO TABS
ORAL_TABLET | ORAL | Status: AC
  Filled 2022-06-02: qty 2

## 2022-06-02 MED ORDER — MIDAZOLAM HCL 2 MG/2ML IJ SOLN
INTRAMUSCULAR | Status: AC
  Filled 2022-06-02: qty 2

## 2022-06-02 MED ORDER — 0.9 % SODIUM CHLORIDE (POUR BTL) OPTIME
TOPICAL | Status: DC | PRN
  Administered 2022-06-02: 500 mL

## 2022-06-02 MED ORDER — CHLORHEXIDINE GLUCONATE 0.12 % MT SOLN
OROMUCOSAL | Status: AC
  Filled 2022-06-02: qty 15

## 2022-06-02 MED ORDER — FAMOTIDINE 20 MG PO TABS
ORAL_TABLET | ORAL | Status: AC
  Filled 2022-06-02: qty 1

## 2022-06-02 MED ORDER — CHLORHEXIDINE GLUCONATE 0.12 % MT SOLN
15.0000 mL | Freq: Once | OROMUCOSAL | Status: AC
  Administered 2022-06-02: 15 mL via OROMUCOSAL

## 2022-06-02 MED ORDER — MIDAZOLAM HCL 2 MG/2ML IJ SOLN
INTRAMUSCULAR | Status: DC | PRN
  Administered 2022-06-02 (×2): 1 mg via INTRAVENOUS

## 2022-06-02 MED ORDER — ONDANSETRON HCL 4 MG/2ML IJ SOLN
INTRAMUSCULAR | Status: AC
  Filled 2022-06-02: qty 2

## 2022-06-02 MED ORDER — EPHEDRINE 5 MG/ML INJ
INTRAVENOUS | Status: AC
  Filled 2022-06-02: qty 5

## 2022-06-02 SURGICAL SUPPLY — 48 items
BLADE SURG MINI STRL (BLADE) ×1 IMPLANT
BNDG CMPR 5X2 KNTD ELC UNQ LF (GAUZE/BANDAGES/DRESSINGS) ×1
BNDG CMPR STD VLCR NS LF 5.8X4 (GAUZE/BANDAGES/DRESSINGS) ×2
BNDG ELASTIC 2INX 5YD STR LF (GAUZE/BANDAGES/DRESSINGS) IMPLANT
BNDG ELASTIC 4X5.8 VLCR NS LF (GAUZE/BANDAGES/DRESSINGS) ×2 IMPLANT
BNDG ESMARCH 4 X 12 STRL LF (GAUZE/BANDAGES/DRESSINGS) ×1
BNDG ESMARCH 4X12 STRL LF (GAUZE/BANDAGES/DRESSINGS) ×1 IMPLANT
CORD BIP STRL DISP 12FT (MISCELLANEOUS) ×1 IMPLANT
CUFF TOURN SGL QUICK 18X4 (TOURNIQUET CUFF) IMPLANT
DRAPE ORTHO SPLIT 77X108 STRL (DRAPES) ×1
DRAPE SURG 17X11 SM STRL (DRAPES) ×1 IMPLANT
DRAPE SURG ORHT 6 SPLT 77X108 (DRAPES) ×1 IMPLANT
DRSG GAUZE FLUFF 36X18 (GAUZE/BANDAGES/DRESSINGS) ×1 IMPLANT
DURAPREP 26ML APPLICATOR (WOUND CARE) ×2 IMPLANT
ELECT REM PT RETURN 9FT ADLT (ELECTROSURGICAL) ×1
ELECTRODE REM PT RTRN 9FT ADLT (ELECTROSURGICAL) ×1 IMPLANT
FORCEPS JEWEL BIP 4-3/4 STR (INSTRUMENTS) ×1 IMPLANT
GAUZE SPONGE 4X4 12PLY STRL (GAUZE/BANDAGES/DRESSINGS) ×1 IMPLANT
GAUZE XEROFORM 1X8 LF (GAUZE/BANDAGES/DRESSINGS) ×1 IMPLANT
GLOVE BIOGEL PI IND STRL 9 (GLOVE) ×1 IMPLANT
GLOVE BIOGEL PI ORTHO SZ9 (GLOVE) ×4 IMPLANT
GOWN STRL REUS TWL 2XL XL LVL4 (GOWN DISPOSABLE) ×1 IMPLANT
GOWN STRL REUS W/ TWL LRG LVL3 (GOWN DISPOSABLE) ×1 IMPLANT
GOWN STRL REUS W/TWL LRG LVL3 (GOWN DISPOSABLE) ×1
KIT TURNOVER KIT A (KITS) ×1 IMPLANT
MANIFOLD NEPTUNE II (INSTRUMENTS) ×1 IMPLANT
NDL FILTER BLUNT 18X1 1/2 (NEEDLE) ×1 IMPLANT
NEEDLE FILTER BLUNT 18X1 1/2 (NEEDLE) ×1 IMPLANT
NS IRRIG 500ML POUR BTL (IV SOLUTION) ×1 IMPLANT
PACK EXTREMITY ARMC (MISCELLANEOUS) ×1 IMPLANT
PAD CAST 4YDX4 CTTN HI CHSV (CAST SUPPLIES) ×2 IMPLANT
PADDING CAST BLEND 4X4 STRL (MISCELLANEOUS) ×3 IMPLANT
PADDING CAST COTTON 4X4 STRL (CAST SUPPLIES) ×2
SLING ARM LRG DEEP (SOFTGOODS) ×1 IMPLANT
SLING ARM M TX990204 (SOFTGOODS) ×1 IMPLANT
SPLINT CAST 1 STEP 3X12 (MISCELLANEOUS) ×1 IMPLANT
STOCKINETTE 48X4 2 PLY STRL (GAUZE/BANDAGES/DRESSINGS) ×1 IMPLANT
STOCKINETTE STRL 4IN 9604848 (GAUZE/BANDAGES/DRESSINGS) ×1 IMPLANT
STRIP CLOSURE SKIN 1/2X4 (GAUZE/BANDAGES/DRESSINGS) ×1 IMPLANT
SUT ETHILON 3-0 FS-10 30 BLK (SUTURE) ×1
SUT ETHILON 4-0 (SUTURE) ×1
SUT ETHILON 4-0 FS2 18XMFL BLK (SUTURE) ×1
SUT ETHILON 5-0 FS-2 18 BLK (SUTURE) ×1 IMPLANT
SUTURE EHLN 3-0 FS-10 30 BLK (SUTURE) ×1 IMPLANT
SUTURE ETHLN 4-0 FS2 18XMF BLK (SUTURE) ×1 IMPLANT
SYR 3ML LL SCALE MARK (SYRINGE) ×1 IMPLANT
TRAP FLUID SMOKE EVACUATOR (MISCELLANEOUS) ×1 IMPLANT
WATER STERILE IRR 500ML POUR (IV SOLUTION) ×1 IMPLANT

## 2022-06-02 NOTE — Transfer of Care (Signed)
  Immediate Anesthesia Transfer of Care Note  Patient: Philip Ryan.  Procedure(s) Performed: CARPAL TUNNEL RELEASE (Left)  Patient Location: PACU  Anesthesia Type:General  Level of Consciousness: sedated  Airway & Oxygen Therapy: Patient Spontanous Breathing and Patient connected to nasal cannula oxygen  Post-op Assessment: Report given to RN and Post -op Vital signs reviewed and stable  Post vital signs: Reviewed and stable  Last Vitals:  Vitals Value Taken Time  BP 93/74 06/02/22 1335  Temp    Pulse 86 06/02/22 1338  Resp 14 06/02/22 1338  SpO2 93 % 06/02/22 1338  Vitals shown include unvalidated device data.  Last Pain:  Vitals:   06/02/22 0932  TempSrc: Tympanic  PainSc: 4          Complications: No notable events documented.

## 2022-06-02 NOTE — H&P (Signed)
PREOPERATIVE H&P  Chief Complaint: Left Carpal Tunnel Syndrome  HPI: Philip Ryan. is a 67 y.o. male who presents for preoperative history and physical with a diagnosis of Left Carpal Tunnel Syndrome. Symptoms of left wrist pain, paresthesias and weakness are significantly impairing activities of daily living and ability to perform duties at work.  He has failed nonoperative management wished to proceed with a left open carpal tunnel release.  A nerve conduction study and EMG have been performed which showed moderate to advanced carpal tunnel syndrome in the left wrist.   Past Medical History:  Diagnosis Date   Allergy    Carpal tunnel syndrome, left 04/2022   Diabetes mellitus type 2 with complications (HCC)    Hyperlipidemia    Hypertension    Sleep apnea    no CPAP   Past Surgical History:  Procedure Laterality Date   COLONOSCOPY  2022   KNEE ARTHROSCOPY Right 1987   cartilege torn   SHOULDER ARTHROSCOPY WITH BICEPS TENDON REPAIR Left 08/29/2016   Procedure: SHOULDER ARTHROSCOPY WITH BICEPS TENDON REPAIR;  Surgeon: Juanell Fairly, MD;  Location: [facility];  Service: Orthopedics;  Laterality: Left;   SHOULDER ARTHROSCOPY WITH OPEN ROTATOR CUFF REPAIR AND DISTAL CLAVICLE ACROMINECTOMY Left 08/29/2016   Procedure: SHOULDER ARTHROSCOPY WITH OPEN ROTATOR CUFF REPAIR AND DISTAL CLAVICLE ACROMINECTOMY;  Surgeon: Juanell Fairly, MD;  Location: [facility];  Service: Orthopedics;  Laterality: Left;  Sleep Apnea - No CPAP   Social History   Socioeconomic History   Marital status: Married    Spouse name: Junious Dresser   Number of children: 3   Years of education: Not on file   Highest education level: Not on file  Occupational History   Not on file  Tobacco Use   Smoking status: Former    Types: Cigarettes    Quit date: 07/28/1974    Years since quitting: 47.8   Smokeless tobacco: Never  Vaping Use   Vaping Use: Never used  Substance and Sexual Activity    Alcohol use: Yes    Alcohol/week: 2.0 standard drinks of alcohol    Types: 2 Cans of beer per week    Comment: bi weekly use per pt.   Drug use: No   Sexual activity: Not on file  Other Topics Concern   Not on file  Social History Narrative   Not on file   Social Determinants of Health   Financial Resource Strain: Not on file  Food Insecurity: Not on file  Transportation Needs: Not on file  Physical Activity: Not on file  Stress: Not on file  Social Connections: Not on file   Family History  Problem Relation Age of Onset   Diabetes Mother    Colon cancer Mother    CAD Maternal Grandfather    Heart attack Maternal Grandfather    Cancer Maternal Grandfather    Colon cancer Maternal Grandfather    Esophageal cancer Neg Hx    Rectal cancer Neg Hx    Stomach cancer Neg Hx    No Known Allergies Prior to Admission medications   Medication Sig Start Date End Date Taking? Authorizing Provider  amLODipine (NORVASC) 10 MG tablet Take 1 tablet (10 mg total) by mouth daily. 09/07/21  Yes Karamalegos, Netta Neat, DO  Multiple Vitamin (MULTIVITAMIN) tablet Take 1 tablet by mouth daily.   Yes [provider]  telmisartan (MICARDIS) 80 MG tablet Take 1 tablet (80 mg total) by mouth daily. 09/07/21  Yes Karamalegos, Netta Neat,  DO  XIGDUO XR 10-998 MG TB24 TAKE 1 TABLET BY MOUTH ONCE DAILY 04/06/22  Yes Karamalegos, Netta Neat, DO  MOUNJARO 7.5 MG/0.5ML Pen Inject 7.5 mg into the skin once a week. Patient taking differently: Inject 7.5 mg into the skin once a week. Monday 02/25/22   Smitty Cords, DO  sildenafil (REVATIO) 20 MG tablet Take 1-5 pills about 30 min prior to sex. Start with 1 and increase as needed. Patient not taking: Reported on 06/02/2022 12/14/21   Smitty Cords, DO     Positive ROS: All other systems have been reviewed and were otherwise negative with the exception of those mentioned in the HPI and as above.  Physical Exam: General: Alert,  no acute distress Cardiovascular: Regular rate and rhythm, no murmurs rubs or gallops.  No pedal edema Respiratory: Clear to auscultation bilaterally, no wheezes rales or rhonchi. No cyanosis, no use of accessory musculature GI: No organomegaly, abdomen is soft and non-tender nondistended with positive bowel sounds. Skin: Skin intact, no lesions within the operative field. Neurologic: Sensation intact distally Psychiatric: Patient is competent for consent with normal mood and affect Lymphatic: No cervical lymphadenopathy  MUSCULOSKELETAL: Left hand: Patient skin is intact.  There is no erythema ecchymosis swelling or atrophy.  Patient has intact sensation to light touch in all 5 fingers of the left hand.  His fingers are well-perfused.  Patient has full digital range of motion.  Assessment: Left Carpal Tunnel Syndrome  Plan: Plan for Procedure(s): LEFT OPEN CARPAL TUNNEL RELEASE  I reviewed the details of the operation as well as the postoperative course with the patient and his wife who is with him this morning in the preoperative area.  A preop history and physical was performed at the bedside.  I marked the left wrist according to the hospital's correct site of surgery protocol.  I discussed the risks and benefits of surgery. The risks include but are not limited to infection, bleeding,  nerve or blood vessel injury, joint stiffness or loss of motion, persistent pain, weakness or instability, persistent or recurrent carpal tunnel symptoms and the need for further surgery. Patient understood these risks and wished to proceed.     Juanell Fairly, MD   06/02/2022 12:34 PM

## 2022-06-02 NOTE — Discharge Instructions (Signed)

## 2022-06-02 NOTE — Op Note (Signed)
06/02/2022  1:37 PM  PATIENT:  Philip Ryan.    PRE-OPERATIVE DIAGNOSIS:  Left Carpal Tunnel Syndrome  POST-OPERATIVE DIAGNOSIS:  Same  PROCEDURE:  LEFT OPEN CARPAL TUNNEL RELEASE  SURGEON:  Juanell Fairly, MD  ANESTHESIA:   General  PREOPERATIVE INDICATIONS:  Philip Ryan. is a  67 y.o. male with a diagnosis of Left Carpal Tunnel Syndrome who failed conservative measures and elected for surgical management.    I discussed the risks and benefits of surgery. The risks include but are not limited to infection, bleeding, nerve or blood vessel injury, joint stiffness or loss of motion, persistent pain, weakness, recurrence of symptoms and the need for further surgery. Patient understood these risks and wished to proceed.   OPERATIVE FINDINGS: Significant median nerve compression at the carpal tunnel, left upper extremity  OPERATIVE PROCEDURE: Patient was met in the preoperative area with his wife at the bedside. I signed the left wrist with my initials and the word yes according the hospital's correct site of surgery protocol. A pre-op H&P was performed at the bedside. I answered all the patient's questions.   He was then brought to the operating room where he underwent general with 1 gram of ancef IV.  He was positioned supine on the operative table. The left arm was placed on a hand table. A tourniquet was applied to the left upper extremity. The left upper extremity  was prepped and draped in a sterile fashion. A timeout was performed to verify the patient's name, date of birth, medical record number, correct site of surgery correct procedure to be performed. The time out was also used to confirm the patient received antibiotics that all necessary instruments were available in the room. The left upper extremity was then exsanguinated with an Esmarch and the tourniquet inflated to 250 mmHg for 30 minutes.   An incision following the palmar crease was made. This was made in  line with the web space between the middle and ring fingers and the distal extent of the incision was where it intersected Kaplan's cardinal line. Bleeding vessels were cauterized with a bipolar.  The subcutaneous tissue was carefully dissected out with a Metzenbaum scissor and pickup until the palmar fascia was encountered. The distal extent of the transverse carpal ligament was then identified. A Freer elevator was placed under the transverse carpal ligament running distally to proximally. A micro-Beaver blade was then used to incise the transverse carpal ligament taking care to avoid injury to any neurovascular structures. The carpal tunnel was found to be extremely constricted. There was significant compression on the median nerve. The transverse carpal ligament was completely released. The nerve was visualized in its entirety within the carpal tunnel.   The wound was copiously irrigated. The skin was then approximated with 5-0 nylon. Xeroform was placed over the incision. A dry sterile dressing was applied along with a volar fiberglass splint. Patient was overwrapped with an Ace wrap. The tourniquet was deflated at 30 minutes.  A sling was placed on the left upper extremity. The patient was extubated and brought to the PACU in stable condition. I was scrubbed and present the entire case and all sharp and instrument counts were correct at the conclusion the case.  I spoke with the patient's wife in the post-op consultation room to let her know the case had been performed without complication and the patient was stable in the recovery room.  I reviewed with her the post-op instructions and answered all her  questions.     Kathreen Devoid, MD

## 2022-06-02 NOTE — Anesthesia Procedure Notes (Signed)
Procedure Name: LMA Insertion Date/Time: 06/02/2022 12:32 PM  Performed by: Ginger Carne, CRNAPre-anesthesia Checklist: Patient identified, Emergency Drugs available, Suction available, Patient being monitored and Timeout performed Patient Re-evaluated:Patient Re-evaluated prior to induction Oxygen Delivery Method: Circle system utilized Preoxygenation: Pre-oxygenation with 100% oxygen Induction Type: IV induction LMA: LMA inserted LMA Size: 4.0 Tube type: Oral Number of attempts: 1 Placement Confirmation: positive ETCO2 and breath sounds checked- equal and bilateral Tube secured with: Tape Dental Injury: Teeth and Oropharynx as per pre-operative assessment

## 2022-06-02 NOTE — Anesthesia Postprocedure Evaluation (Signed)
Anesthesia Post Note  Patient: Philip Ryan.  Procedure(s) Performed: CARPAL TUNNEL RELEASE (Left)  Patient location during evaluation: PACU Anesthesia Type: General Level of consciousness: awake and alert Pain management: pain level controlled Vital Signs Assessment: post-procedure vital signs reviewed and stable Respiratory status: spontaneous breathing, nonlabored ventilation, respiratory function stable and patient connected to nasal cannula oxygen Cardiovascular status: blood pressure returned to baseline and stable Postop Assessment: no apparent nausea or vomiting Anesthetic complications: no   No notable events documented.   Last Vitals:  Vitals:   06/02/22 1400 06/02/22 1415  BP: (!) 112/96 (!) 120/91  Pulse: 87 82  Resp: 13 14  Temp:    SpO2: 97% 94%    Last Pain:  Vitals:   06/02/22 1400  TempSrc:   PainSc: 6                  Corinda Gubler

## 2022-06-02 NOTE — Anesthesia Preprocedure Evaluation (Signed)
Anesthesia Evaluation  Patient identified by MRN, date of birth, ID band Patient awake    Reviewed: Allergy & Precautions, NPO status , Patient's Chart, lab work & pertinent test results  History of Anesthesia Complications Negative for: history of anesthetic complications  Airway Mallampati: III  TM Distance: >3 FB Neck ROM: Full    Dental no notable dental hx. (+) Teeth Intact   Pulmonary sleep apnea , neg COPD, Patient abstained from smoking.Not current smoker, former smoker   Pulmonary exam normal breath sounds clear to auscultation       Cardiovascular Exercise Tolerance: Good METShypertension, (-) CAD and (-) Past MI (-) dysrhythmias  Rhythm:Regular Rate:Normal - Systolic murmurs    Neuro/Psych  Neuromuscular disease  negative psych ROS   GI/Hepatic ,neg GERD  ,,(+)     (-) substance abuse    Endo/Other  diabetes, Well Controlled  On weekly GLP1, not taken in 10 days  Renal/GU negative Renal ROS     Musculoskeletal  (+) Arthritis ,    Abdominal   Peds  Hematology   Anesthesia Other Findings Past Medical History: No date: Allergy 04/2022: Carpal tunnel syndrome, left No date: Diabetes mellitus type 2 with complications (HCC) No date: Hyperlipidemia No date: Hypertension No date: Sleep apnea     Comment:  no CPAP  Reproductive/Obstetrics                              Anesthesia Physical Anesthesia Plan  ASA: 2  Anesthesia Plan: General   Post-op Pain Management: Tylenol PO (pre-op)* and Toradol IV (intra-op)*   Induction: Intravenous  PONV Risk Score and Plan: 2 and Ondansetron, Dexamethasone and Midazolam  Airway Management Planned: LMA  Additional Equipment: None  Intra-op Plan:   Post-operative Plan: Extubation in OR  Informed Consent: I have reviewed the patients History and Physical, chart, labs and discussed the procedure including the risks, benefits and  alternatives for the proposed anesthesia with the patient or authorized representative who has indicated his/her understanding and acceptance.     Dental advisory given  Plan Discussed with: CRNA and Surgeon  Anesthesia Plan Comments: (Discussed risks of anesthesia with patient, including PONV, sore throat, lip/dental/eye damage. Rare risks discussed as well, such as cardiorespiratory and neurological sequelae, and allergic reactions. Discussed the role of CRNA in patient's perioperative care. Patient understands.)         Anesthesia Quick Evaluation

## 2022-06-03 ENCOUNTER — Encounter: Payer: Self-pay | Admitting: Orthopedic Surgery

## 2022-06-10 DIAGNOSIS — G5602 Carpal tunnel syndrome, left upper limb: Secondary | ICD-10-CM | POA: Diagnosis not present

## 2022-06-14 ENCOUNTER — Other Ambulatory Visit: Payer: Self-pay | Admitting: Family Medicine

## 2022-06-14 ENCOUNTER — Ambulatory Visit (INDEPENDENT_AMBULATORY_CARE_PROVIDER_SITE_OTHER): Payer: PPO | Admitting: Family Medicine

## 2022-06-14 ENCOUNTER — Encounter: Payer: Self-pay | Admitting: Family Medicine

## 2022-06-14 VITALS — BP 106/62 | HR 103 | Ht 70.0 in | Wt 197.0 lb

## 2022-06-14 DIAGNOSIS — E785 Hyperlipidemia, unspecified: Secondary | ICD-10-CM

## 2022-06-14 DIAGNOSIS — E1169 Type 2 diabetes mellitus with other specified complication: Secondary | ICD-10-CM | POA: Diagnosis not present

## 2022-06-14 DIAGNOSIS — Z23 Encounter for immunization: Secondary | ICD-10-CM

## 2022-06-14 DIAGNOSIS — I10 Essential (primary) hypertension: Secondary | ICD-10-CM | POA: Diagnosis not present

## 2022-06-14 DIAGNOSIS — Z0000 Encounter for general adult medical examination without abnormal findings: Secondary | ICD-10-CM

## 2022-06-14 DIAGNOSIS — R351 Nocturia: Secondary | ICD-10-CM

## 2022-06-14 MED ORDER — XIGDUO XR 10-1000 MG PO TB24
1.0000 | ORAL_TABLET | Freq: Every day | ORAL | 3 refills | Status: DC
Start: 2022-06-14 — End: 2022-08-01

## 2022-06-14 NOTE — Assessment & Plan Note (Signed)
Significant improvement on GIP/GLP Mounjaro, prior A1c to 6.9 Improved weight loss Complications - obesity, hyperlipidemia HTN  Plan:  Keep on Mounjaro 7.5mg  weekly inj Eventually we can discontinue or reduce Xigduo or switch to Metformin  3. Encourage improved lifestyle - low carb, low sugar diet, reduce portion size, continue improving regular exercise 4. Continue ASA, ARB, not on Statin can review again

## 2022-06-14 NOTE — Progress Notes (Signed)
Subjective:    Patient ID: Philip Poet., male    DOB: 18-Mar-1955, 67 y.o.   MRN: [id number]  Philip Pitones. is a 67 y.o. male presenting on 06/14/2022 for Medical Management of Chronic Issues   HPI  S/p Carpal Tunnel Surgery, Left Dr Martha Clan [facility], surgery on 06/02/22 with good results, still healing, wearing brace  CHRONIC DM, Type 2: Previously A1c 9.9, on oral medication Xigduo, he was changed to [employer] previously with improved A1c to 8.5 Now recent labs showed even further improved A1c to 7.5 CBGs: Improving Meds: Mounjaro 5mg  weekly inj, issue with high cost $200 for monthly since out of stock for 3 month - CONTINUES on Xigduo XR 10-1000mg  24HR Reports good compliance. Tolerating well w/o side-effects Currently on ARB Due for DM Eye exam - [facility] 12/2021 Denies hypoglycemia, polyuria, visual changes, numbness or tingling.   CHRONIC HTN: Reports controlled BP Current Meds - Amlodipine 10mg  daily, Telmisartan 80mg  daily   Reports good compliance, took meds today. Tolerating well, w/o complaints. Denies CP, dyspnea, HA, edema, dizziness / lightheadedness    HYPERLIPIDEMIA: - Reports no concerns. Last lipid panel 2023, reviewed with major improved cholesterol down from 166 to 118 LDL Not on statin therapy, has declined On ASA 81   Osteoarthritis Right Knee He has significant bone spurs. Prior Ortho, Dr Deeann Saint - Arthroscopic knee surgery 30-40 years ago.    Additional concerns. Left hand numbness paresthesia  Prior history of L Shoulder surgery   Health Maintenance:   Tdap today   Colonoscopy completed 04/2020, next repeat 5 yr, has fam history Colon CA         12/14/2021    2:23 PM 09/07/2021    8:06 AM 09/17/2020    8:04 AM  Depression screen PHQ 2/9  Decreased Interest 0 0 0  Down, Depressed, Hopeless 0 0 0  PHQ - 2 Score 0 0 0  Altered sleeping 0 0 0  Tired, decreased energy 0 0 0  Change in appetite 0 0 0   Feeling bad or failure about yourself  0 0 0  Trouble concentrating 0 0 0  Moving slowly or fidgety/restless 0 0 0  Suicidal thoughts 0 0 0  PHQ-9 Score 0 0 0  Difficult doing work/chores Not difficult at all Not difficult at all     Social History   Tobacco Use   Smoking status: Former    Types: Cigarettes    Quit date: 07/28/1974    Years since quitting: 47.9   Smokeless tobacco: Never  Vaping Use   Vaping Use: Never used  Substance Use Topics   Alcohol use: Yes    Alcohol/week: 2.0 standard drinks of alcohol    Types: 2 Cans of beer per week    Comment: bi weekly use per pt.   Drug use: No    Review of Systems Per HPI unless specifically indicated above     Objective:    BP 106/62   Pulse (!) 103   Ht 5\' 10"  (1.778 m)   Wt 197 lb (89.4 kg)   SpO2 100%   BMI 28.27 kg/m   Wt Readings from Last 3 Encounters:  06/14/22 197 lb (89.4 kg)  06/02/22 200 lb (90.7 kg)  05/25/22 200 lb (90.7 kg)    Physical Exam Vitals and nursing note reviewed.  Constitutional:      General: He is not in acute distress.    Appearance: Normal appearance. He  is well-developed. He is not diaphoretic.     Comments: Well-appearing, comfortable, cooperative  HENT:     Head: Normocephalic and atraumatic.  Eyes:     General:        Right eye: No discharge.        Left eye: No discharge.     Conjunctiva/sclera: Conjunctivae normal.  Cardiovascular:     Rate and Rhythm: Normal rate.  Pulmonary:     Effort: Pulmonary effort is normal.  Musculoskeletal:     Right lower leg: No edema.     Left lower leg: No edema.     Comments: L wrist brace  Skin:    General: Skin is warm and dry.     Findings: No erythema or rash.  Neurological:     Mental Status: He is alert and oriented to person, place, and time.  Psychiatric:        Mood and Affect: Mood normal.        Behavior: Behavior normal.        Thought Content: Thought content normal.     Comments: Well groomed, good eye contact,  normal speech and thoughts       Results for orders placed or performed during the hospital encounter of 06/02/22  Glucose, capillary  Result Value Ref Range   Glucose-Capillary 166 (H) 70 - 99 mg/dL      Assessment & Plan:   Problem List Items Addressed This Visit     Hyperlipidemia due to type 2 diabetes mellitus (HCC)    Check lipid panel in 6 months to re-assess Reconsider Statin if indicated      Relevant Medications   XIGDUO XR 10-998 MG TB24   Hypertension    Well-controlled HTN - Home BP readings reviewed  No known complications     Plan:  1. Continue current BP regimen Amlodipine 10mg  and Telmisartan 80mg  daily 2. Encourage improved lifestyle - low sodium diet, regular exercise 3. Continue monitor BP outside office, bring readings to next visit, if persistently >140/90 or new symptoms notify office sooner      Type 2 diabetes mellitus with other specified complication (HCC) - Primary    Significant improvement on GIP/GLP Mounjaro, prior A1c to 6.9 Improved weight loss Complications - obesity, hyperlipidemia HTN  Plan:  Keep on Mounjaro 7.5mg  weekly inj Eventually we can discontinue or reduce Xigduo or switch to Metformin  3. Encourage improved lifestyle - low carb, low sugar diet, reduce portion size, continue improving regular exercise 4. Continue ASA, ARB, not on Statin can review again      Relevant Medications   XIGDUO XR 10-998 MG TB24   Other Visit Diagnoses     Need for diphtheria-tetanus-pertussis (Tdap) vaccine       Relevant Orders   Tdap vaccine greater than or equal to 7yo IM (Completed)       Orders Placed This Encounter  Procedures   Tdap vaccine greater than or equal to 7yo IM     Meds ordered this encounter  Medications   XIGDUO XR 10-998 MG TB24    Sig: Take 1 tablet by mouth daily.    Dispense:  90 tablet    Refill:  3    Add refills     Follow up plan: Return in about 6 months (around 12/15/2022) for 6 month  fasting lab only then 1 week later Annual Physical (AM apt, not Mon/Fri).  Future labs ordered for 12/22/22   Saralyn Pilar, DO [facility] 06/14/2022, 8:26 AM

## 2022-06-14 NOTE — Patient Instructions (Addendum)
Thank you for coming to the office today.  Refilled Xiduo medication  BP meds run out at end of August 2024, let us know for refills  Greggory Keen seems to be working very well, still have refills.   DUE for FASTING BLOOD WORK (no food or drink after midnight before the lab appointment, only water or coffee without cream/sugar on the morning of)  SCHEDULE "Lab Only" visit in the morning at the clinic for lab draw in 6 MONTHS   - Make sure Lab Only appointment is at about 1 week before your next appointment, so that results will be available  For Lab Results, once available within 2-3 days of blood draw, you can can log in to MyChart online to view your results and a brief explanation. Also, we can discuss results at next follow-up visit.   Please schedule a Follow-up Appointment to: Return in about 6 months (around 12/15/2022) for 6 month fasting lab only then 1 week later Annual Physical (AM apt, not Mon/Fri).  If you have any other questions or concerns, please feel free to call the office or send a message through MyChart. You may also schedule an earlier appointment if necessary.  Additionally, you may be receiving a survey about your experience at our office within a few days to 1 week by e-mail or mail. We value your feedback.  Saralyn Pilar, DO [facility], [place]

## 2022-06-14 NOTE — Assessment & Plan Note (Signed)
Well-controlled HTN - Home BP readings reviewed  No known complications    Plan:  1. Continue current BP regimen Amlodipine 10mg and Telmisartan 80mg daily 2. Encourage improved lifestyle - low sodium diet, regular exercise 3. Continue monitor BP outside office, bring readings to next visit, if persistently >140/90 or new symptoms notify office sooner 

## 2022-06-14 NOTE — Assessment & Plan Note (Signed)
Check lipid panel in 6 months to re-assess Reconsider Statin if indicated

## 2022-06-22 DIAGNOSIS — M9903 Segmental and somatic dysfunction of lumbar region: Secondary | ICD-10-CM | POA: Diagnosis not present

## 2022-06-22 DIAGNOSIS — M545 Low back pain, unspecified: Secondary | ICD-10-CM | POA: Diagnosis not present

## 2022-06-22 DIAGNOSIS — M9905 Segmental and somatic dysfunction of pelvic region: Secondary | ICD-10-CM | POA: Diagnosis not present

## 2022-06-22 DIAGNOSIS — M9904 Segmental and somatic dysfunction of sacral region: Secondary | ICD-10-CM | POA: Diagnosis not present

## 2022-07-01 ENCOUNTER — Encounter: Payer: Self-pay | Admitting: Orthopedic Surgery

## 2022-07-07 ENCOUNTER — Telehealth: Payer: Self-pay

## 2022-07-07 NOTE — Telephone Encounter (Signed)
(  Key: [id number])  Your information has been sent to [employer].

## 2022-07-11 ENCOUNTER — Encounter: Payer: Self-pay | Admitting: Family Medicine

## 2022-07-15 ENCOUNTER — Other Ambulatory Visit: Payer: Self-pay | Admitting: Pharmacist

## 2022-07-15 ENCOUNTER — Other Ambulatory Visit: Payer: Self-pay | Admitting: Family Medicine

## 2022-07-15 DIAGNOSIS — E1169 Type 2 diabetes mellitus with other specified complication: Secondary | ICD-10-CM

## 2022-07-15 NOTE — Patient Instructions (Signed)
Goals Addressed             This Visit's Progress    Pharmacy Goals       I look forward to speaking with you again by telephone on 07/18/2022 at 1:45 PM.  Thank you!  Estelle Grumbles, PharmD, [facility] Clinical Pharmacist [facility] [phone]

## 2022-07-15 NOTE — Progress Notes (Signed)
   07/15/2022  Patient ID: Philip Poet., male   DOB: 02-Nov-1955, 67 y.o.   MRN: [id number]  Receive message from [facility] CPhT Noreene Larsson Simcox advising patient appearing on report for diabetes medication adherence. Per notes with PCP from 6/24, patient having difficulty with obtaining from pharmacy, but also with the cost of his Xigduo XR 10-998 mg prescription. Based on reported copayment, appears patient in the coverage gap of his [employer] Medicare prescription coverage for calendar year.  Outreach to patient by telephone today. Discuss with patient the requirements for patient assistance programs through manufacturer, such as patient assistance for Xigduo from [employer].  - Patient advises that he will need to review his financial information in order to determine if eligible. Schedule appointment to follow up  Will collaborate with PCP  Plan: Clinical Pharmacist will outreach to patient again regarding medication on 07/18/2022 at 1:45 PM   Estelle Grumbles, PharmD, [facility] Clinical Pharmacist [facility] [phone]

## 2022-07-18 ENCOUNTER — Telehealth: Payer: Self-pay | Admitting: Pharmacist

## 2022-07-18 ENCOUNTER — Telehealth: Payer: PPO

## 2022-07-18 NOTE — Telephone Encounter (Signed)
   Outreach Note  07/18/2022 Name: Philip Ryan. MRN: [id number] DOB: 18-Sep-1955  Referred by: Smitty Cords, DO Reason for referral : No chief complaint on file.  Was unable to reach patient via telephone during our appointment time.  Reach patient by phone later in the afternoon, but he requests to reschedule as it is not a good time. Rescheduled as requested   Follow Up Plan: Clinical Pharmacist will outreach to patient by telephone again on 08/01/2022 at 1:00 PM   Estelle Grumbles, PharmD, [facility] Clinical Pharmacist [facility] [phone]

## 2022-07-27 DIAGNOSIS — M545 Low back pain, unspecified: Secondary | ICD-10-CM | POA: Diagnosis not present

## 2022-07-27 DIAGNOSIS — M9905 Segmental and somatic dysfunction of pelvic region: Secondary | ICD-10-CM | POA: Diagnosis not present

## 2022-07-27 DIAGNOSIS — M9903 Segmental and somatic dysfunction of lumbar region: Secondary | ICD-10-CM | POA: Diagnosis not present

## 2022-07-27 DIAGNOSIS — M9904 Segmental and somatic dysfunction of sacral region: Secondary | ICD-10-CM | POA: Diagnosis not present

## 2022-08-01 ENCOUNTER — Ambulatory Visit: Payer: PPO | Admitting: Pharmacist

## 2022-08-01 ENCOUNTER — Telehealth: Payer: Self-pay | Admitting: Pharmacist

## 2022-08-01 ENCOUNTER — Other Ambulatory Visit: Payer: Self-pay | Admitting: Internal Medicine

## 2022-08-01 DIAGNOSIS — E1169 Type 2 diabetes mellitus with other specified complication: Secondary | ICD-10-CM

## 2022-08-01 DIAGNOSIS — I10 Essential (primary) hypertension: Secondary | ICD-10-CM

## 2022-08-01 MED ORDER — METFORMIN HCL 500 MG PO TABS: ORAL_TABLET | ORAL | 0 refills | Status: DC

## 2022-08-01 MED ORDER — ONETOUCH DELICA LANCETS 33G MISC: 1.0000 | Freq: Three times a day (TID) | 0 refills | Status: AC | PRN

## 2022-08-01 MED ORDER — ONETOUCH VERIO VI STRP: ORAL_STRIP | 0 refills | Status: AC

## 2022-08-01 MED ORDER — ONETOUCH VERIO W/DEVICE KIT: 1.0000 | PACK | Freq: Three times a day (TID) | 0 refills | Status: AC

## 2022-08-01 MED ORDER — METFORMIN HCL ER 500 MG PO TB24: ORAL_TABLET | ORAL | 0 refills | Status: DC

## 2022-08-01 NOTE — Telephone Encounter (Signed)
I have changed to to ER version

## 2022-08-01 NOTE — Telephone Encounter (Signed)
Medication, meter, strips and lancets sent to pharmacy per request

## 2022-08-01 NOTE — Telephone Encounter (Signed)
Philip Ryan,  Patient is currently unable to afford Xigduo XR as he is in the coverage gap of his Part D plan. He does not meet criteria for Xigduo PAP.   Would you mind sending Rx to [facility] for patient to start metformin ER 500 mg - 1 tab daily with breakfast for 1 week, then 1 tab twice daily with meals for 1 week, then 2 tab with breakfast and 1 with supper for 1 week and then 2 tablets twice daily with meals?  Also, patient does not currently have a glucometer. Would you please send Rxs for One Touch Verio meter, One Touch Verio test strips and One Touch Delica lancets to pharmacy for patient?  Thank you!  Gentry Fitz

## 2022-08-01 NOTE — Patient Instructions (Signed)
Goals Addressed             This Visit's Progress    Pharmacy Goals       Our goal A1c is less than 7%. This corresponds with fasting sugars less than 130 and 2 hour after meal sugars less than 180. Please keep a log of your results when checking your blood sugar   Please watch the mail for an envelope from [facility] containing the patient assistance program application. Please complete this application and mail back to [facility] Pharmacy Technician Noreene Larsson Simcox along with a copy of your Medicare Part D prescription card and a copy of your proof of income document.  Thank you!  Estelle Grumbles, PharmD, [facility] Clinical Pharmacist [facility] [phone]

## 2022-08-01 NOTE — Progress Notes (Signed)
08/01/2022 Name: Philip Ryan. MRN: [id number] DOB: Feb 12, 1955  Chief Complaint  Patient presents with   Medication Assistance   Medication Management    Philip Ryan. is a 67 y.o. year old male who presented for a telephone visit.   They were referred to the pharmacist by their PCP for assistance in managing diabetes and medication access.    Subjective:  Care Team: Primary Care Provider: Smitty Cords, DO ; Next Scheduled Visit: 12/29/2022  Medication Access/Adherence  Current Pharmacy:  [facility] - [place], [place] - [address] [place] [address] Phone: [phone] Fax: [phone]  [facility] [place] [facility] [address], [place] - [address] [place] [address] Phone: [phone] Fax: [phone]   Patient reports affordability concerns with their medications: Yes  Patient reports access/transportation concerns to their pharmacy: No  Patient reports adherence concerns with their medications:  No    Patient currently in coverage gap of Medicare prescription coverage  Diabetes:  Current medications:  - Mounjaro 7.5 mg weekly  Reports currently has >3 week supply remaining - Reports ran out of Xigduo XR (10-998 mg daily) ~2 weeks ago  Medications previously tried: metformin IR  Denies currently monitoring home blood sugar  Patient denies hypoglycemic s/sx including dizziness, shakiness, sweating.   Statin therapy: none  Current medication access support: none Based on reported income, patient does not qualify for patient assistance for Xigduo through manufacturer Currently there is not a patient assistance program for [facility] through the manufacturer - Based on reported income, patient would meet criteria for patient assistance for therapeutic alternative Ozempic from [employer]   Hypertension:  Current medications:  - amlodipine 10 mg daily - telmisartan 80 mg  daily   Patient does not have a validated, automated, upper arm home BP cuff  Patient denies hypotensive s/sx including dizziness, lightheadedness.     Objective:  Lab Results  Component Value Date   HGBA1C 6.9 (H) 05/25/2022    Lab Results  Component Value Date   CREATININE 0.84 05/25/2022   BUN 14 05/25/2022   NA 137 05/25/2022   K 3.8 05/25/2022   CL 104 05/25/2022   CO2 24 05/25/2022    Lab Results  Component Value Date   CHOL 197 12/07/2021   HDL 51 12/07/2021   LDLCALC 118 (H) 12/07/2021   TRIG 163 (H) 12/07/2021   CHOLHDL 3.9 12/07/2021   BP Readings from Last 3 Encounters:  06/14/22 106/62  06/02/22 (!) 126/90  12/14/21 124/82   Pulse Readings from Last 3 Encounters:  06/14/22 (!) 103  06/02/22 85  12/14/21 (!) 102     Medications Reviewed Today     Reviewed by Manuela Neptune, RPH-CPP (Pharmacist) on 08/01/22 at 1322  Med List Status: <None>   Medication Order Taking? Sig Documenting Provider Last Dose Status Informant  amLODipine (NORVASC) 10 MG tablet [id number] Yes Take 1 tablet (10 mg total) by mouth daily. Smitty Cords, DO Taking Active   aspirin EC 81 MG tablet [id number] Yes Take 81 mg by mouth every other day. Swallow whole. [provider] Taking Active   MOUNJARO 7.5 MG/0.5ML Pen [id number] Yes Inject 7.5 mg into the skin once a week.  Patient taking differently: Inject 7.5 mg into the skin once a week. Monday   Smitty Cords, DO Taking Active   Multiple Vitamin (MULTIVITAMIN) tablet [id number] Yes Take 1 tablet by mouth daily. [provider] Taking  Active   telmisartan (MICARDIS) 80 MG tablet [id number] Yes Take 1 tablet (80 mg total) by mouth daily. Smitty Cords, DO Taking Active   XIGDUO XR 10-998 MG TB24 [id number] No Take 1 tablet by mouth daily.  Patient not taking: Reported on 08/01/2022   Smitty Cords, DO Not Taking Active               Assessment/Plan:    Comprehensive medication review performed; medication list updated in electronic medical record   Diabetes: - Currently controlled - Reviewed long term cardiovascular and renal outcomes of uncontrolled blood sugar - Collaborate with NP Nicki Reaper, covering for PCP today, to request provider send prescriptions to pharmacy for patient for: metformin ER 500 mg (1 tab daily with breakfast for 1 week, then 1 tab twice daily with meals for 1 week, then 2 tab with breakfast and 1 with supper for 1 week and then 2 tablets twice daily with meals), to replace Xigduo XR One Touch Verio meter, One Touch Verio test strips and One Touch Delica lancets  - Counsel patient on strategies to aid with tolerability of metformin and advise patient to take dose titration more slowly/hold on increasing if needed if having GI side effects - Recommend to pick up glucometer from pharmacy and start to check glucose, keep log of results and have this record to review during upcoming appointments - Have collaborated with PCP regarding option to switch patient from [place] to [employer] for affordability (patient assistance program availability) - Meets financial criteria for [employer] patient assistance program through [employer]. Will collaborate with provider, CPhT, and patient to pursue assistance.  - Counsel on statin therapy for LDL control/ASCVD risk reduction Patient declines to try a statin at this time as known friends to have side effects with cholesterol medications, but agreeable to discuss further during next call    Follow Up Plan: Clinical Pharmacist will follow up with patient by telephone on 08/22/2022 at 9:45 AM   Estelle Grumbles, PharmD, [facility] [place], CPP Clinical Pharmacist [facility] [phone]

## 2022-08-01 NOTE — Addendum Note (Signed)
Addended by: Lorre Munroe on: 08/01/2022 04:19 PM   Modules accepted: Orders

## 2022-08-04 ENCOUNTER — Other Ambulatory Visit: Payer: Self-pay | Admitting: Family Medicine

## 2022-08-04 ENCOUNTER — Telehealth: Payer: Self-pay | Admitting: Pharmacy Technician

## 2022-08-04 DIAGNOSIS — I10 Essential (primary) hypertension: Secondary | ICD-10-CM

## 2022-08-04 DIAGNOSIS — Z5986 Financial insecurity: Secondary | ICD-10-CM

## 2022-08-04 NOTE — Progress Notes (Signed)
[facility] Customer service manager [facility])                                            [facility] Quality Pharmacy Team    08/04/2022  Philip Richard Jr. 1955/02/29 [id number] .                                    Medication Assistance Referral  Referral From:  [facility] PharmD Philip Ryan  Medication/Company: Philip Ryan / Novo Nordisk Patient application portion:  Mining engineer portion: Faxed  to Dr. Saralyn Pilar Provider address/fax verified via: Office website  Philip Ryan, CPhT Philip Ryan  [facility] Office: [phone] Fax: [phone] Email: [email or web address]

## 2022-08-05 NOTE — Telephone Encounter (Signed)
Requested Prescriptions  Refused Prescriptions Disp Refills   telmisartan (MICARDIS) 80 MG tablet [Pharmacy Med Name: TELMISARTAN 80 MG TAB] 90 tablet 3    Sig: TAKE ONE TABLET BY MOUTH DAILY     Cardiovascular:  Angiotensin Receptor Blockers Passed - 08/04/2022  9:52 AM      Passed - Cr in normal range and within 180 days    Creat  Date Value Ref Range Status  12/07/2021 0.63 (L) 0.70 - 1.35 mg/dL Final   Creatinine, Ser  Date Value Ref Range Status  05/25/2022 0.84 0.61 - 1.24 mg/dL Final   Creatinine, Urine  Date Value Ref Range Status  12/14/2021 52 20 - 320 mg/dL Final         Passed - K in normal range and within 180 days    Potassium  Date Value Ref Range Status  05/25/2022 3.8 3.5 - 5.1 mmol/L Final         Passed - Patient is not pregnant      Passed - Last BP in normal range    BP Readings from Last 1 Encounters:  06/14/22 106/62         Passed - Valid encounter within last 6 months    Recent Outpatient Visits           4 days ago Type 2 diabetes mellitus with other specified complication, without long-term current use of insulin (HCC)   [facility] Delles, Gentry Fitz A, RPH-CPP   1 month ago Type 2 diabetes mellitus with other specified complication, without long-term current use of insulin (HCC)   [facility] [place], Netta Neat, DO   7 months ago Annual physical exam   [place] [facility] Smitty Cords, DO   11 months ago Type 2 diabetes mellitus with other specified complication, without long-term current use of insulin [facility])   [place] [facility] [place], Netta Neat, DO   1 year ago Type 2 diabetes mellitus with other specified complication, without long-term current use of insulin (HCC)   [place] [facility] [place], Netta Neat, DO       Future Appointments             In 4 months Althea Charon, Netta Neat, DO  [place] [facility]             amLODipine (NORVASC) 10 MG tablet [Pharmacy Med Name: AMLODIPINE BESYLATE 10 MG TAB] 90 tablet 3    Sig: TAKE ONE TABLET BY MOUTH DAILY     Cardiovascular: Calcium Channel Blockers 2 Passed - 08/04/2022  9:52 AM      Passed - Last BP in normal range    BP Readings from Last 1 Encounters:  06/14/22 106/62         Passed - Last Heart Rate in normal range    Pulse Readings from Last 1 Encounters:  06/14/22 (!) 103         Passed - Valid encounter within last 6 months    Recent Outpatient Visits           4 days ago Type 2 diabetes mellitus with other specified complication, without long-term current use of insulin (HCC)   [place] [facility] Delles, Gentry Fitz A, RPH-CPP   1 month ago Type 2 diabetes mellitus with other specified complication, without long-term current use of insulin (HCC)    [address]  [facility] Smitty Cords, DO   7 months ago Annual physical exam   [place] [facility] Smitty Cords, DO   11 months ago Type 2 diabetes mellitus with other specified complication, without long-term current use of insulin [facility])   [facility] [place], Netta Neat, DO   1 year ago Type 2 diabetes mellitus with other specified complication, without long-term current use of insulin [facility])   [facility] Althea Charon, Netta Neat, DO       Future Appointments             In 4 months Althea Charon, Netta Neat, DO [place] [facility]

## 2022-08-18 ENCOUNTER — Other Ambulatory Visit: Payer: Self-pay | Admitting: Family Medicine

## 2022-08-18 DIAGNOSIS — E1169 Type 2 diabetes mellitus with other specified complication: Secondary | ICD-10-CM

## 2022-08-19 ENCOUNTER — Other Ambulatory Visit: Payer: Self-pay | Admitting: Family Medicine

## 2022-08-19 NOTE — Telephone Encounter (Signed)
Requested medication (s) are due for refill today - no  Requested medication (s) are on the active medication list -no  Future visit scheduled -yes  Last refill: 07/16/22  Notes to clinic: off protocol- provider review - medication no longer listed on current medication list  Requested Prescriptions  Pending Prescriptions Disp Refills   MOUNJARO 7.5 MG/0.5ML Pen [Pharmacy Med Name: MOUNJARO 7.5 MG/0.5 ML PEN]  5    Sig: INJECT 7.5MG  UNDER THE SKIN ONCE A WEEK     Off-Protocol Failed - 08/19/2022  2:41 AM      Failed - Medication not assigned to a protocol, review manually.      Passed - Valid encounter within last 12 months    Recent Outpatient Visits           2 weeks ago Type 2 diabetes mellitus with other specified complication, without long-term current use of insulin (HCC)   [facility] Delles, Gentry Fitz A, RPH-CPP   2 months ago Type 2 diabetes mellitus with other specified complication, without long-term current use of insulin [facility])   [facility] [place], Netta Neat, DO   8 months ago Annual physical exam   [facility] Smitty Cords, DO   11 months ago Type 2 diabetes mellitus with other specified complication, without long-term current use of insulin [facility])   [facility] [place], Netta Neat, DO   1 year ago Type 2 diabetes mellitus with other specified complication, without long-term current use of insulin (HCC)   [place] Althea Charon, Netta Neat, DO       Future Appointments             In 4 months Althea Charon, Netta Neat, DO [place] [facility], [facility]               Requested Prescriptions  Pending Prescriptions Disp Refills   MOUNJARO 7.5 MG/0.5ML Pen [Pharmacy Med Name: MOUNJARO 7.5 MG/0.5 ML PEN]  5    Sig: INJECT 7.5MG  UNDER THE SKIN ONCE A WEEK     Off-Protocol Failed -  08/19/2022  2:41 AM      Failed - Medication not assigned to a protocol, review manually.      Passed - Valid encounter within last 12 months    Recent Outpatient Visits           2 weeks ago Type 2 diabetes mellitus with other specified complication, without long-term current use of insulin (HCC)   [place] Delles, Gentry Fitz A, RPH-CPP   2 months ago Type 2 diabetes mellitus with other specified complication, without long-term current use of insulin [facility])   [place] [facility] [place], Netta Neat, DO   8 months ago Annual physical exam   [place] [facility] Smitty Cords, DO   11 months ago Type 2 diabetes mellitus with other specified complication, without long-term current use of insulin [facility])    [facility] [place], Netta Neat, DO   1 year ago Type 2 diabetes mellitus with other specified complication, without long-term current use of insulin [facility])    [facility] Smitty Cords, DO       Future Appointments             In 4 months Althea Charon, Netta Neat, DO [facility]

## 2022-08-22 ENCOUNTER — Encounter: Payer: Self-pay | Admitting: Pharmacist

## 2022-08-22 ENCOUNTER — Ambulatory Visit: Payer: PPO | Admitting: Pharmacist

## 2022-08-22 DIAGNOSIS — E1169 Type 2 diabetes mellitus with other specified complication: Secondary | ICD-10-CM

## 2022-08-22 NOTE — Patient Instructions (Addendum)
Goals Addressed             This Visit's Progress    Pharmacy Goals       Our goal A1c is less than 7%. This corresponds with fasting sugars less than 130 and 2 hour after meal sugars less than 180. Please keep a log of your results when checking your blood sugar   Thank you!  Estelle Grumbles, PharmD, [facility] Clinical Pharmacist [facility] [phone]

## 2022-08-22 NOTE — Progress Notes (Signed)
08/22/2022 Name: Philip Ryan. MRN: [id number] DOB: 05/09/1955  Chief Complaint  Patient presents with   Medication Assistance   Medication Management    Philip Ryan. is a 67 y.o. year old male who presented for a telephone visit.   They were referred to the pharmacist by their PCP for assistance in managing diabetes and medication access.      Subjective:   Care Team: Primary Care Provider: Smitty Cords, DO ; Next Scheduled Visit: 12/29/2022  Medication Access/Adherence  Current Pharmacy:  [facility] - [place], [place] - [address] [place] [address] Phone: [phone] Fax: [phone]  [facility] [address] [place] - [address] [place] [address] Phone: [phone] Fax: [phone]  [id number] - [place] - [address] [place] [address] Phone: [phone] Fax: [phone]   Patient reports affordability concerns with their medications: Yes  Patient reports access/transportation concerns to their pharmacy: No  Patient reports adherence concerns with their medications:  No     Patient currently in coverage gap of Medicare prescription coverage   Diabetes:   Current medications:  - Mounjaro 7.5 mg weekly on Mondays (using up last dose today) Note plan to switch patient to Ozempic 0.5 mg weekly (once able to receive from assistance program) - metformin ER 500 mg - 2 tablets (1000 mg) twice daily   Reports has followed instructions provided for tapering up to current dose and tolerating well   Medications previously tried: metformin IR, Xigduo (cost)   Denies currently monitoring home blood sugar, but confirms received testing supplies   Patient denies hypoglycemic s/sx including dizziness, shakiness, sweating.    Statin therapy: none   Current medication access support:  - Collaborating with [facility] CPhT and PCP to aid patient with  applying for patient assistance for Ozempic from [employer] received application from [facility] CPhT and plans to complete this and mail back to her tomorrow  Objective:  Lab Results  Component Value Date   HGBA1C 6.9 (H) 05/25/2022    Lab Results  Component Value Date   CREATININE 0.84 05/25/2022   BUN 14 05/25/2022   NA 137 05/25/2022   K 3.8 05/25/2022   CL 104 05/25/2022   CO2 24 05/25/2022    Lab Results  Component Value Date   CHOL 197 12/07/2021   HDL 51 12/07/2021   LDLCALC 118 (H) 12/07/2021   TRIG 163 (H) 12/07/2021   CHOLHDL 3.9 12/07/2021    Medications Reviewed Today     Reviewed by Manuela Neptune, RPH-CPP (Pharmacist) on 08/22/22 at 1244  Med List Status: <None>   Medication Order Taking? Sig Documenting Provider Last Dose Status Informant  amLODipine (NORVASC) 10 MG tablet [id number]  Take 1 tablet (10 mg total) by mouth daily. Smitty Cords, DO  Active   aspirin EC 81 MG tablet [id number]  Take 81 mg by mouth every other day. Swallow whole. [provider]  Active   Blood Glucose Monitoring Suppl (ONETOUCH VERIO) w/Device KIT [id number]  1 Device by Does not apply route 3 (three) times daily. Lorre Munroe, NP  Active   glucose blood [facility] VERIO) test strip [id number]  Check blood sugar 6 x daily Lorre Munroe, NP  Active   metFORMIN (GLUCOPHAGE-XR) 500 MG 24 hr tablet [id number] Yes Take 1 tab p.o. daily in a.m. x 7 days then 1 tab p.o.  twice daily x 7 days then 2 tabs p.o. in a.m. and 1 tab p.o. in p.m. x 7 days then 2 tabs twice daily thereafter  Patient taking differently: 1,000 mg 2 (two) times daily with a meal. Take 1 tab p.o. daily in a.m. x 7 days then 1 tab p.o. twice daily x 7 days then 2 tabs p.o. in a.m. and 1 tab p.o. in p.m. x 7 days then 2 tabs twice daily thereafter   Lorre Munroe, NP Taking Active   Multiple Vitamin (MULTIVITAMIN) tablet [id number]  Take 1 tablet by mouth daily. [provider]   Active   OneTouch Delica Lancets 33G MISC [id number]  1 Device by Does not apply route 3 (three) times daily as needed. Lorre Munroe, NP  Active   OZEMPIC, 0.25 OR 0.5 MG/DOSE, 2 MG/3ML SOPN [id number]  Inject 0.5 mg into the skin once a week. Sent to [employer] PAP [place], Netta Neat, DO  Active   telmisartan (MICARDIS) 80 MG tablet [id number]  Take 1 tablet (80 mg total) by mouth daily. Smitty Cords, DO  Active               Assessment/Plan:   Diabetes: - Currently controlled - Reviewed long term cardiovascular and renal outcomes of uncontrolled blood sugar - Collaborate with NP Nicki Reaper, covering for PCP today. Provider confirms sample of Ozempic 0.5 mg is available for patient in office Follow up with patient to let him know Patient verbalizes understanding of plan to start Ozempic 0.5 mg weekly next Monday, 8/12 (as will take last dose of Mounjaro today) Provide counseling on Ozempic and send patient MyChart message with "how to use" video for Ozempic Patient agrees to review this video this week and then let pharmacist or office know if he has any questions prior to starting Ozempic - Recommend to start to check glucose, keep log of results and have this record to review during upcoming appointments - Collaborating with [facility] CPhT and PCP to aid patient with applying for patient assistance for Ozempic from [employer] on statin therapy for LDL control/ASCVD risk reduction Patient declines to try a statin at this time, but agreeable to discuss further if LDL remains uncontrolled at time of next lab work in December        Follow Up Plan: Clinical Pharmacist will follow up with patient by telephone next month   Estelle Grumbles, PharmD, [employer], CPP Clinical Pharmacist [facility] [phone]

## 2022-08-22 NOTE — Progress Notes (Signed)
Are you able to see if we have any Ozempic 0.5 mg samples for this patient?

## 2022-08-24 DIAGNOSIS — M9903 Segmental and somatic dysfunction of lumbar region: Secondary | ICD-10-CM | POA: Diagnosis not present

## 2022-08-24 DIAGNOSIS — M545 Low back pain, unspecified: Secondary | ICD-10-CM | POA: Diagnosis not present

## 2022-08-24 DIAGNOSIS — M9904 Segmental and somatic dysfunction of sacral region: Secondary | ICD-10-CM | POA: Diagnosis not present

## 2022-08-24 DIAGNOSIS — M9905 Segmental and somatic dysfunction of pelvic region: Secondary | ICD-10-CM | POA: Diagnosis not present

## 2022-08-28 ENCOUNTER — Encounter: Payer: Self-pay | Admitting: Family Medicine

## 2022-08-30 NOTE — Telephone Encounter (Signed)
We do have triamcinolone here.  Do you want to schedule with you or is a nurse visit okay?   Thanks,   -Vernona Rieger

## 2022-08-30 NOTE — Telephone Encounter (Signed)
He will need Triamcinolone 40mg  injection IM x 1 dose.  It can be scheduled as nurse visit, I would not need to see him for this.  Please notify him that we have it and can schedule.  Thank you!

## 2022-08-31 ENCOUNTER — Telehealth: Payer: Self-pay

## 2022-08-31 NOTE — Telephone Encounter (Signed)
Pt scheduled @8 :15am on 09/01/22. Pt notified   Copied from CRM [id number]. Topic: Appointment Scheduling - Scheduling Inquiry for Clinic >> Aug 31, 2022  1:30 PM Marlow Baars wrote: Reason for CRM: The patient called back to schedule a nurse visit for a Triamcinolone 40mg  injection IM x 1 dose injection. Please call patient back to schedule.

## 2022-09-01 ENCOUNTER — Ambulatory Visit (INDEPENDENT_AMBULATORY_CARE_PROVIDER_SITE_OTHER): Payer: PPO | Admitting: Family Medicine

## 2022-09-01 DIAGNOSIS — J309 Allergic rhinitis, unspecified: Secondary | ICD-10-CM | POA: Diagnosis not present

## 2022-09-01 MED ORDER — TRIAMCINOLONE ACETONIDE 40 MG/ML IJ SUSP
40.0000 mg | Freq: Once | INTRAMUSCULAR | Status: AC
Start: 2022-09-01 — End: 2022-09-01
  Administered 2022-09-01: 40 mg via INTRAMUSCULAR

## 2022-09-01 NOTE — Progress Notes (Signed)
Nurse visit today for Triamcinolone 40mg  IM injection only. Not seen by provider.  Signing chart to close encounter.

## 2022-09-02 ENCOUNTER — Other Ambulatory Visit: Payer: Self-pay | Admitting: Family Medicine

## 2022-09-02 DIAGNOSIS — I10 Essential (primary) hypertension: Secondary | ICD-10-CM

## 2022-09-05 NOTE — Telephone Encounter (Signed)
Requested Prescriptions  Pending Prescriptions Disp Refills   telmisartan (MICARDIS) 80 MG tablet [Pharmacy Med Name: TELMISARTAN 80 MG TAB] 90 tablet 3    Sig: TAKE ONE TABLET BY MOUTH DAILY     Cardiovascular:  Angiotensin Receptor Blockers Passed - 09/02/2022  2:15 PM      Passed - Cr in normal range and within 180 days    Creat  Date Value Ref Range Status  12/07/2021 0.63 (L) 0.70 - 1.35 mg/dL Final   Creatinine, Ser  Date Value Ref Range Status  05/25/2022 0.84 0.61 - 1.24 mg/dL Final   Creatinine, Urine  Date Value Ref Range Status  12/14/2021 52 20 - 320 mg/dL Final         Passed - K in normal range and within 180 days    Potassium  Date Value Ref Range Status  05/25/2022 3.8 3.5 - 5.1 mmol/L Final         Passed - Patient is not pregnant      Passed - Last BP in normal range    BP Readings from Last 1 Encounters:  06/14/22 106/62         Passed - Valid encounter within last 6 months    Recent Outpatient Visits           4 days ago Allergic sinusitis   [facility] Smitty Cords, DO   2 weeks ago Type 2 diabetes mellitus with other specified complication, without long-term current use of insulin (HCC)   [facility] Delles, Gentry Fitz A, RPH-CPP   1 month ago Type 2 diabetes mellitus with other specified complication, without long-term current use of insulin (HCC)   [facility] Delles, Gentry Fitz A, RPH-CPP   2 months ago Type 2 diabetes mellitus with other specified complication, without long-term current use of insulin [facility])   [place] [facility] [place], Netta Neat, DO   8 months ago Annual physical exam   [facility] Althea Charon, Netta Neat, DO       Future Appointments             In 3 months Althea Charon, Netta Neat, DO [facility]             amLODipine (NORVASC) 10  MG tablet [Pharmacy Med Name: AMLODIPINE BESYLATE 10 MG TAB] 90 tablet 3    Sig: TAKE ONE TABLET BY MOUTH DAILY     Cardiovascular: Calcium Channel Blockers 2 Passed - 09/02/2022  2:15 PM      Passed - Last BP in normal range    BP Readings from Last 1 Encounters:  06/14/22 106/62         Passed - Last Heart Rate in normal range    Pulse Readings from Last 1 Encounters:  06/14/22 (!) 103         Passed - Valid encounter within last 6 months    Recent Outpatient Visits           4 days ago Allergic sinusitis   [facility] [place], Netta Neat, DO   2 weeks ago Type 2 diabetes mellitus with other specified complication, without long-term current use of insulin [facility])   [place] [facility] Delles, Gentry Fitz A, RPH-CPP   1 month ago Type 2 diabetes mellitus with other specified complication, without long-term current use of insulin (HCC)  [facility] Delles, Gentry Fitz A, RPH-CPP   2 months ago Type 2 diabetes mellitus with other specified complication, without long-term current use of insulin [facility])   [place] [facility] [place], Netta Neat, DO   8 months ago Annual physical exam   [facility] Smitty Cords, DO       Future Appointments             In 3 months Althea Charon, Netta Neat, DO [place] [facility], [facility]

## 2022-09-21 DIAGNOSIS — M9903 Segmental and somatic dysfunction of lumbar region: Secondary | ICD-10-CM | POA: Diagnosis not present

## 2022-09-21 DIAGNOSIS — M9904 Segmental and somatic dysfunction of sacral region: Secondary | ICD-10-CM | POA: Diagnosis not present

## 2022-09-21 DIAGNOSIS — M9905 Segmental and somatic dysfunction of pelvic region: Secondary | ICD-10-CM | POA: Diagnosis not present

## 2022-09-21 DIAGNOSIS — M545 Low back pain, unspecified: Secondary | ICD-10-CM | POA: Diagnosis not present

## 2022-09-23 ENCOUNTER — Ambulatory Visit: Payer: PPO | Admitting: Pharmacist

## 2022-09-23 DIAGNOSIS — E1169 Type 2 diabetes mellitus with other specified complication: Secondary | ICD-10-CM

## 2022-09-23 MED ORDER — METFORMIN HCL ER 500 MG PO TB24
1000.0000 mg | ORAL_TABLET | Freq: Two times a day (BID) | ORAL | 0 refills | Status: DC
Start: 2022-09-23 — End: 2022-12-08

## 2022-09-23 NOTE — Progress Notes (Signed)
09/23/2022 Name: Philip Ryan. MRN: [id number] DOB: 07/03/1955  Chief Complaint  Patient presents with   Medication Management   Medication Assistance    Philip Ryan. is a 67 y.o. year old male who presented for a telephone visit.   They were referred to the pharmacist by their PCP for assistance in managing diabetes and medication access.      Subjective:   Care Team: Primary Care Provider: Smitty Cords, DO ; Next Scheduled Visit: 12/29/2022  Medication Access/Adherence  Current Pharmacy:  [facility] - [place], [place] - [address] [place] [address] Phone: [phone] Fax: [phone]  [facility] [address] [place] - [address] [place] [address] Phone: [phone] Fax: [phone]   Patient reports affordability concerns with their medications: Yes  Patient reports access/transportation concerns to their pharmacy: No  Patient reports adherence concerns with their medications:  No     Patient currently in coverage gap of Medicare prescription coverage   Diabetes:   Current medications:  - Ozempic 0.5 mg weekly on Mondays - taking from sample  Reports tolerating well  Reports used up last dose this past Monday - metformin ER 500 mg - 2 tablets (1000 mg) twice daily with meals             Reports tolerating well  Reports has only a few tablets remaining and requests renewal of this prescription   Medications previously tried: metformin IR, Xigduo (cost); Mounjaro (cost)   Recalls when last checked home blood sugar before supper, reading was 127   Patient denies hypoglycemic s/sx including dizziness, shakiness, sweating.    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  States plans to mail income document back to [facility] CPhT on Monday or Tuesday   Objective:  Lab Results  Component Value Date   HGBA1C 6.9 (H) 05/25/2022    Lab Results  Component Value Date   CREATININE 0.84 05/25/2022   BUN 14 05/25/2022   NA 137 05/25/2022   K 3.8 05/25/2022   CL 104 05/25/2022   CO2 24 05/25/2022    Lab Results  Component Value Date   CHOL 197 12/07/2021   HDL 51 12/07/2021   LDLCALC 118 (H) 12/07/2021   TRIG 163 (H) 12/07/2021   CHOLHDL 3.9 12/07/2021    Medications Reviewed Today     Reviewed by Manuela Neptune, RPH-CPP (Pharmacist) on 09/23/22 at 1019  Med List Status: <None>   Medication Order Taking? Sig Documenting Provider Last Dose Status Informant  amLODipine (NORVASC) 10 MG tablet [id number]  TAKE ONE TABLET BY MOUTH DAILY Smitty Cords, DO  Active   aspirin EC 81 MG tablet [id number]  Take 81 mg by mouth every other day. Swallow whole. [provider]  Active   Blood Glucose Monitoring Suppl (ONETOUCH VERIO) w/Device KIT [id number]  1 Device by Does not apply route 3 (three) times daily. Lorre Munroe, NP  Active   glucose blood [facility] VERIO) test strip [id number]  Check blood sugar 6 x daily Lorre Munroe, NP  Active   metFORMIN (GLUCOPHAGE-XR) 500 MG 24 hr tablet  [id number] Yes Take 1 tab p.o. daily in a.m. x 7 days then 1 tab p.o. twice daily x 7 days then 2 tabs p.o. in a.m. and 1 tab p.o. in p.m. x 7 days then 2 tabs twice daily thereafter  Patient taking differently: 1,000 mg 2 (two) times daily with a meal. Take 1 tab p.o. daily in a.m. x 7 days then 1 tab p.o. twice daily x 7 days then 2 tabs p.o. in a.m. and 1 tab p.o. in p.m. x 7 days then 2 tabs twice daily thereafter   Lorre Munroe, NP Taking Active   Multiple Vitamin (MULTIVITAMIN) tablet [id number]  Take 1 tablet by mouth daily.  [provider]  Active   OneTouch Delica Lancets 33G MISC [id number]  1 Device by Does not apply route 3 (three) times daily as needed. Lorre Munroe, NP  Active   OZEMPIC, 0.25 OR 0.5 MG/DOSE, 2 MG/3ML SOPN [id number] Yes Inject 0.5 mg into the skin once a week. Sent to [employer] PAP [place], Netta Neat, DO Taking Active   telmisartan (MICARDIS) 80 MG tablet [id number]  TAKE ONE TABLET BY MOUTH DAILY Althea Charon, Netta Neat, DO  Active               Assessment/Plan:   Diabetes: - Currently controlled - Reviewed long term cardiovascular and renal outcomes of uncontrolled blood sugar - Recommend to continue to check glucose, keep log of results and have this record to review during upcoming appointments - Collaborating with [facility] CPhT and PCP to aid patient with applying for patient assistance for Ozempic from [employer] with PCP. Office will provide patient with another sample of the Ozempic 0.5 mg pen while working with patient on applying for Ozempic patient assistance  Patient states that he will come by office on Monday to pick this up - Will send renewal of metformin ER prescription to pharmacy for patient as requested  - Have counseled on statin therapy for LDL control/ASCVD risk reduction Patient declined to try a statin at this time, but agreeable to discuss further if LDL remains uncontrolled at time of next lab work in December        Follow Up Plan: Clinical Pharmacist will follow up with patient by telephone on 10/24/2022 at 10:30 AM    Estelle Grumbles, PharmD, [place], CPP Clinical Pharmacist [facility] [phone]

## 2022-09-23 NOTE — Patient Instructions (Signed)
Goals Addressed             This Visit's Progress    Pharmacy Goals       Our goal A1c is less than 7%. This corresponds with fasting sugars less than 130 and 2 hour after meal sugars less than 180. Please keep a log of your results when checking your blood sugar   Thank you!  Estelle Grumbles, PharmD, [facility] Clinical Pharmacist [facility] [phone]

## 2022-09-27 ENCOUNTER — Other Ambulatory Visit: Payer: Self-pay | Admitting: Family Medicine

## 2022-09-27 DIAGNOSIS — E1169 Type 2 diabetes mellitus with other specified complication: Secondary | ICD-10-CM

## 2022-09-28 NOTE — Telephone Encounter (Signed)
Refilled 09/23/22, duplicate request. E-Prescribing Status: Receipt confirmed by pharmacy (09/23/2022  2:02 PM EDT).  Requested Prescriptions  Pending Prescriptions Disp Refills   metFORMIN (GLUCOPHAGE-XR) 500 MG 24 hr tablet [Pharmacy Med Name: METFORMIN HCL ER 500 MG TAB] 360 tablet 0    Sig: TAKE TWO TABLETS BY MOUTH TWICE DAILY WITH A MEAL     Endocrinology:  Diabetes - Biguanides Failed - 09/27/2022 10:21 AM      Failed - B12 Level in normal range and within 720 days    No results found for: "VITAMINB12"       Passed - Cr in normal range and within 360 days    Creat  Date Value Ref Range Status  12/07/2021 0.63 (L) 0.70 - 1.35 mg/dL Final   Creatinine, Ser  Date Value Ref Range Status  05/25/2022 0.84 0.61 - 1.24 mg/dL Final   Creatinine, Urine  Date Value Ref Range Status  12/14/2021 52 20 - 320 mg/dL Final         Passed - HBA1C is between 0 and 7.9 and within 180 days    Hemoglobin A1C  Date Value Ref Range Status  09/08/2015 7.8  Final   HB A1C (BAYER DCA - WAIVED)  Date Value Ref Range Status  10/09/2019 8.7 (H) <7.0 % Final    Comment:                                          Diabetic Adult            <7.0                                       Healthy Adult        4.3 - 5.7                                                           (DCCT/NGSP) American Diabetes Association's Summary of Glycemic Recommendations for Adults with Diabetes: Hemoglobin A1c <7.0%. More stringent glycemic goals (A1c <6.0%) may further reduce complications at the cost of increased risk of hypoglycemia.    Hgb A1c MFr Bld  Date Value Ref Range Status  05/25/2022 6.9 (H) 4.8 - 5.6 % Final    Comment:    (NOTE) Pre diabetes:          5.7%-6.4%  Diabetes:              >6.4%  Glycemic control for   <7.0% adults with diabetes          Passed - eGFR in normal range and within 360 days    GFR calc Af Amer  Date Value Ref Range Status  10/09/2019 114 >59 mL/min/1.73 Final    Comment:     **[facility] currently reports eGFR in compliance with the current**   recommendations of the [employer]. [facility] will   update reporting as new guidelines are published from the NKF-ASN   Task force.    GFR, Estimated  Date Value Ref Range Status  05/25/2022 >60 >60 mL/min Final    Comment:    (NOTE) Calculated using  the CKD-EPI Creatinine Equation (2021)    eGFR  Date Value Ref Range Status  12/07/2021 106 > OR = 60 mL/min/1.81m2 Final         Passed - Valid encounter within last 6 months    Recent Outpatient Visits           5 days ago Type 2 diabetes mellitus with other specified complication, without long-term current use of insulin (HCC)   [place] [facility] Delles, Gentry Fitz A, RPH-CPP   3 weeks ago Allergic sinusitis   [place] [facility], Netta Neat, DO   1 month ago Type 2 diabetes mellitus with other specified complication, without long-term current use of insulin (HCC)   [place] [facility] Delles, Gentry Fitz A, RPH-CPP   1 month ago Type 2 diabetes mellitus with other specified complication, without long-term current use of insulin (HCC)   [place] [facility] Delles, Gentry Fitz A, RPH-CPP   3 months ago Type 2 diabetes mellitus with other specified complication, without long-term current use of insulin (HCC)   [place] [facility], Netta Neat, DO       Future Appointments             In 3 months Althea Charon, Netta Neat, DO [facility], [facility]            Passed - CBC within normal limits and completed in the last 12 months    WBC  Date Value Ref Range Status  05/25/2022 6.2 4.0 - 10.5 K/uL Final   RBC  Date Value Ref Range Status  05/25/2022 5.63 4.22 - 5.81 MIL/uL Final   Hemoglobin  Date Value Ref Range Status  05/25/2022 16.4 13.0 - 17.0 g/dL Final  21/30/8657 84.6 13.0 - 17.7 g/dL  Final   HCT  Date Value Ref Range Status  05/25/2022 49.1 39.0 - 52.0 % Final   Hematocrit  Date Value Ref Range Status  10/09/2019 49.7 37.5 - 51.0 % Final   MCHC  Date Value Ref Range Status  05/25/2022 33.4 30.0 - 36.0 g/dL Final   [facility]  Date Value Ref Range Status  05/25/2022 29.1 26.0 - 34.0 pg Final   MCV  Date Value Ref Range Status  05/25/2022 87.2 80.0 - 100.0 fL Final  10/09/2019 90 79 - 97 fL Final   No results found for: "PLTCOUNTKUC", "LABPLAT", "POCPLA" RDW  Date Value Ref Range Status  05/25/2022 12.0 11.5 - 15.5 % Final  10/09/2019 13.0 11.6 - 15.4 % Final

## 2022-09-29 ENCOUNTER — Telehealth: Payer: Self-pay | Admitting: Pharmacy Technician

## 2022-09-29 DIAGNOSIS — Z5986 Financial insecurity: Secondary | ICD-10-CM

## 2022-09-29 NOTE — Progress Notes (Signed)
[facility])                                            [facility] Quality Pharmacy Team    09/29/2022  Winston Reischl. 04-17-1955 [id number]  Received both patient and provider portion(s) of patient assistance application(s) for Ozempic. Faxed completed application and required documents into [employer].   Pattricia Boss, CPhT [place]  Office: [phone] Fax: [phone] Email: [email or web address]

## 2022-10-05 ENCOUNTER — Telehealth: Payer: Self-pay | Admitting: Pharmacy Technician

## 2022-10-05 DIAGNOSIS — Z5986 Financial insecurity: Secondary | ICD-10-CM

## 2022-10-05 NOTE — Progress Notes (Signed)
[facility])                                            [facility] Quality Pharmacy Team    10/05/2022  Philip Ryan. [birth year] [id number]  Care coordination call placed to [employer] in regard to Ozempic application.  Spoke to [name] who informs patient is DENIED as patient's income is greater than 400% [employer]PL and therefore does not meet program guidelines.  Will route note to [facility] PharmD to discuss with patient at next office visit as was not able to reach patient to inform today. HIPAA complaint v/m was left for patient.  Pattricia Boss, CPhT [place]  Office: [phone] Fax: [phone] Email: [email or web address]

## 2022-10-19 DIAGNOSIS — M9905 Segmental and somatic dysfunction of pelvic region: Secondary | ICD-10-CM | POA: Diagnosis not present

## 2022-10-19 DIAGNOSIS — M545 Low back pain, unspecified: Secondary | ICD-10-CM | POA: Diagnosis not present

## 2022-10-19 DIAGNOSIS — M9904 Segmental and somatic dysfunction of sacral region: Secondary | ICD-10-CM | POA: Diagnosis not present

## 2022-10-19 DIAGNOSIS — M9903 Segmental and somatic dysfunction of lumbar region: Secondary | ICD-10-CM | POA: Diagnosis not present

## 2022-10-24 ENCOUNTER — Ambulatory Visit: Payer: PPO | Admitting: Pharmacist

## 2022-10-24 DIAGNOSIS — E1169 Type 2 diabetes mellitus with other specified complication: Secondary | ICD-10-CM

## 2022-10-24 NOTE — Patient Instructions (Signed)
Goals Addressed             This Visit's Progress    Pharmacy Goals       Our goal A1c is less than 7%. This corresponds with fasting sugars less than 130 and 2 hour after meal sugars less than 180. Please keep a log of your results when checking your blood sugar   Thank you!  Estelle Grumbles, PharmD, [facility] Clinical Pharmacist [facility] [phone]

## 2022-10-24 NOTE — Progress Notes (Signed)
10/24/2022 Name: Philip Ryan. MRN: [id number] DOB: 08-07-1955  Chief Complaint  Patient presents with   Medication Management   Medication Assistance   Medication Adherence    Philip Ryan. is a 67 y.o. year old male who presented for a telephone visit.   They were referred to the pharmacist by their PCP for assistance in managing diabetes and medication access.      Subjective:   Care Team: Primary Care Provider: Smitty Cords, DO ; Next Scheduled Visit: 12/29/2022  Medication Access/Adherence  Current Pharmacy:  [facility] - [place], [place] - [address] [place] [address] Phone: [phone] Fax: [phone]  [facility] [address] [place] - [address] [place] [address] Phone: [phone] Fax: [phone]   Patient reports affordability concerns with their medications: Yes  Patient reports access/transportation concerns to their pharmacy: No  Patient reports adherence concerns with their medications:  No     Patient currently in coverage gap of Medicare prescription coverage   Diabetes:   Current medications:  metformin ER 500 mg - 2 tablets (1000 mg) twice daily with meals             Reports tolerating well   Medications previously tried: metformin IR, Xigduo (cost); Mounjaro (cost); Ozempic (cost)   Denies checking home blood sugar recently   Patient denies hypoglycemic s/sx including dizziness, shakiness, sweating.    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   Current medication access support:  - None; denied for patient assistance for Ozempic from [employer] based on income requirements   Objective:  Lab Results  Component Value Date   HGBA1C 6.9 (H) 05/25/2022    Lab Results  Component Value Date   CREATININE 0.84 05/25/2022   BUN 14 05/25/2022   NA 137 05/25/2022   K 3.8  05/25/2022   CL 104 05/25/2022   CO2 24 05/25/2022    Lab Results  Component Value Date   CHOL 197 12/07/2021   HDL 51 12/07/2021   LDLCALC 118 (H) 12/07/2021   TRIG 163 (H) 12/07/2021   CHOLHDL 3.9 12/07/2021    Medications Reviewed Today     Reviewed by Manuela Neptune, RPH-CPP (Pharmacist) on 10/24/22 at 1113  Med List Status: <None>   Medication Order Taking? Sig Documenting Provider Last Dose Status Informant  amLODipine (NORVASC) 10 MG tablet [id number]  TAKE ONE TABLET BY MOUTH DAILY Smitty Cords, DO  Active   aspirin EC 81 MG tablet [id number]  Take 81 mg by mouth every other day. Swallow whole. [provider]  Active   Blood Glucose Monitoring Suppl (ONETOUCH VERIO) w/Device KIT [id number]  1 Device by Does not apply route 3 (three) times daily. Lorre Munroe, NP  Active   glucose blood [facility] VERIO) test strip [id number]  Check blood sugar 6 x daily Lorre Munroe, NP  Active   metFORMIN (GLUCOPHAGE-XR) 500 MG 24 hr tablet [id number] Yes Take 2 tablets (1,000 mg total) by mouth 2 (two) times daily with a meal. Smitty Cords, DO Taking Active   Multiple Vitamin (MULTIVITAMIN) tablet [id number]  Take 1 tablet by mouth daily. [provider]  Active   OneTouch Delica Lancets 33G MISC [id number]  1 Device by Does not apply route 3 (three) times daily as needed. Lorre Munroe, NP  Active   telmisartan (MICARDIS) 80  MG tablet [id number]  TAKE ONE TABLET BY MOUTH DAILY Althea Charon, Netta Neat, DO  Active               Assessment/Plan:   Diabetes: - Currently controlled - Reviewed long term cardiovascular and renal outcomes of uncontrolled blood sugar - Recommend to check glucose, keep log of results and have this record to review during upcoming appointments - GLP-1 RA therapy currently unaffordable for patient as he is in coverage gap for his Medicare prescription coverage/ not eligible for patient assistance Patient denies  interest in considering alterative therapy options at this time, rather prefers to wait until next calendar year to restart Mounjaro or Ozempic. States will discuss this with PCP at upcoming appointment   - Have counseled on statin therapy for LDL control/ASCVD risk reduction Patient declined to try a statin at this time, but agreeable to discuss further if LDL remains uncontrolled at time of next lab work in December        Follow Up Plan:   Patient denies further medication questions or concerns today Provide patient with contact information for clinic pharmacist to contact if needed in future for medication questions/concerns    Estelle Grumbles, PharmD, P[place]y [place], CPP Clinical Pharmacist [facility] [phone]

## 2022-11-04 ENCOUNTER — Other Ambulatory Visit: Payer: Self-pay | Admitting: Family Medicine

## 2022-11-04 DIAGNOSIS — E1169 Type 2 diabetes mellitus with other specified complication: Secondary | ICD-10-CM

## 2022-11-04 NOTE — Telephone Encounter (Signed)
Rx 09/23/22 #360- too soon Requested Prescriptions  Pending Prescriptions Disp Refills   metFORMIN (GLUCOPHAGE-XR) 500 MG 24 hr tablet [Pharmacy Med Name: METFORMIN HCL ER 500 MG TAB] 360 tablet 0    Sig: TAKE TWO TABLETS BY MOUTH TWICE DAILY WITH A MEAL     Endocrinology:  Diabetes - Biguanides Failed - 11/04/2022  2:29 PM      Failed - B12 Level in normal range and within 720 days    No results found for: "VITAMINB12"       Passed - Cr in normal range and within 360 days    Creat  Date Value Ref Range Status  12/07/2021 0.63 (L) 0.70 - 1.35 mg/dL Final   Creatinine, Ser  Date Value Ref Range Status  05/25/2022 0.84 0.61 - 1.24 mg/dL Final   Creatinine, Urine  Date Value Ref Range Status  12/14/2021 52 20 - 320 mg/dL Final         Passed - HBA1C is between 0 and 7.9 and within 180 days    Hemoglobin A1C  Date Value Ref Range Status  09/08/2015 7.8  Final   HB A1C (BAYER DCA - WAIVED)  Date Value Ref Range Status  10/09/2019 8.7 (H) <7.0 % Final    Comment:                                          Diabetic Adult            <7.0                                       Healthy Adult        4.3 - 5.7                                                           (DCCT/NGSP) American Diabetes Association's Summary of Glycemic Recommendations for Adults with Diabetes: Hemoglobin A1c <7.0%. More stringent glycemic goals (A1c <6.0%) may further reduce complications at the cost of increased risk of hypoglycemia.    Hgb A1c MFr Bld  Date Value Ref Range Status  05/25/2022 6.9 (H) 4.8 - 5.6 % Final    Comment:    (NOTE) Pre diabetes:          5.7%-6.4%  Diabetes:              >6.4%  Glycemic control for   <7.0% adults with diabetes          Passed - eGFR in normal range and within 360 days    GFR calc Af Amer  Date Value Ref Range Status  10/09/2019 114 >59 mL/min/1.73 Final    Comment:    **[facility] currently reports eGFR in compliance with the current**   recommendations  of the [employer]. [facility] will   update reporting as new guidelines are published from the NKF-ASN   Task force.    GFR, Estimated  Date Value Ref Range Status  05/25/2022 >60 >60 mL/min Final    Comment:    (NOTE) Calculated using the CKD-EPI Creatinine Equation (2021)    eGFR  Date Value Ref Range Status  12/07/2021 106 > OR = 60 mL/min/1.72m2 Final         Passed - Valid encounter within last 6 months    Recent Outpatient Visits           1 week ago Type 2 diabetes mellitus with other specified complication, without long-term current use of insulin (HCC)   [place] [facility] Delles, Gentry Fitz A, RPH-CPP   1 month ago Type 2 diabetes mellitus with other specified complication, without long-term current use of insulin (HCC)   [place] [facility] Delles, Gentry Fitz A, RPH-CPP   2 months ago Allergic sinusitis   [place] [facility] [place], Netta Neat, DO   2 months ago Type 2 diabetes mellitus with other specified complication, without long-term current use of insulin (HCC)   [place] [facility] Delles, Gentry Fitz A, RPH-CPP   3 months ago Type 2 diabetes mellitus with other specified complication, without long-term current use of insulin (HCC)   [place] [facility] Delles, Jackelyn Poling, RPH-CPP       Future Appointments             In 1 month Althea Charon, Netta Neat, DO [facility], [facility]            Passed - CBC within normal limits and completed in the last 12 months    WBC  Date Value Ref Range Status  05/25/2022 6.2 4.0 - 10.5 K/uL Final   RBC  Date Value Ref Range Status  05/25/2022 5.63 4.22 - 5.81 MIL/uL Final   Hemoglobin  Date Value Ref Range Status  05/25/2022 16.4 13.0 - 17.0 g/dL Final  16/10/9602 54.0 13.0 - 17.7 g/dL Final   HCT  Date Value Ref Range Status  05/25/2022 49.1 39.0 - 52.0 % Final    Hematocrit  Date Value Ref Range Status  10/09/2019 49.7 37.5 - 51.0 % Final   MCHC  Date Value Ref Range Status  05/25/2022 33.4 30.0 - 36.0 g/dL Final   [facility]  Date Value Ref Range Status  05/25/2022 29.1 26.0 - 34.0 pg Final   MCV  Date Value Ref Range Status  05/25/2022 87.2 80.0 - 100.0 fL Final  10/09/2019 90 79 - 97 fL Final   No results found for: "PLTCOUNTKUC", "LABPLAT", "POCPLA" RDW  Date Value Ref Range Status  05/25/2022 12.0 11.5 - 15.5 % Final  10/09/2019 13.0 11.6 - 15.4 % Final

## 2022-11-23 DIAGNOSIS — M9905 Segmental and somatic dysfunction of pelvic region: Secondary | ICD-10-CM | POA: Diagnosis not present

## 2022-11-23 DIAGNOSIS — M9903 Segmental and somatic dysfunction of lumbar region: Secondary | ICD-10-CM | POA: Diagnosis not present

## 2022-11-23 DIAGNOSIS — M545 Low back pain, unspecified: Secondary | ICD-10-CM | POA: Diagnosis not present

## 2022-11-23 DIAGNOSIS — M9904 Segmental and somatic dysfunction of sacral region: Secondary | ICD-10-CM | POA: Diagnosis not present

## 2022-12-07 ENCOUNTER — Other Ambulatory Visit: Payer: Self-pay | Admitting: Family Medicine

## 2022-12-07 DIAGNOSIS — E1169 Type 2 diabetes mellitus with other specified complication: Secondary | ICD-10-CM

## 2022-12-08 NOTE — Telephone Encounter (Signed)
Requested Prescriptions  Pending Prescriptions Disp Refills   metFORMIN (GLUCOPHAGE-XR) 500 MG 24 hr tablet [Pharmacy Med Name: METFORMIN HCL ER 500 MG TAB] 360 tablet 0    Sig: TAKE TWO TABLETS BY MOUTH TWICE DAILY WITH A MEAL     Endocrinology:  Diabetes - Biguanides Failed - 12/07/2022  2:10 PM      Failed - HBA1C is between 0 and 7.9 and within 180 days    Hemoglobin A1C  Date Value Ref Range Status  09/08/2015 7.8  Final   HB A1C (BAYER DCA - WAIVED)  Date Value Ref Range Status  10/09/2019 8.7 (H) <7.0 % Final    Comment:                                          Diabetic Adult            <7.0                                       Healthy Adult        4.3 - 5.7                                                           (DCCT/NGSP) American Diabetes Association's Summary of Glycemic Recommendations for Adults with Diabetes: Hemoglobin A1c <7.0%. More stringent glycemic goals (A1c <6.0%) may further reduce complications at the cost of increased risk of hypoglycemia.    Hgb A1c MFr Bld  Date Value Ref Range Status  05/25/2022 6.9 (H) 4.8 - 5.6 % Final    Comment:    (NOTE) Pre diabetes:          5.7%-6.4%  Diabetes:              >6.4%  Glycemic control for   <7.0% adults with diabetes          Failed - B12 Level in normal range and within 720 days    No results found for: "VITAMINB12"       Failed - CBC within normal limits and completed in the last 12 months    WBC  Date Value Ref Range Status  05/25/2022 6.2 4.0 - 10.5 K/uL Final   RBC  Date Value Ref Range Status  05/25/2022 5.63 4.22 - 5.81 MIL/uL Final   Hemoglobin  Date Value Ref Range Status  05/25/2022 16.4 13.0 - 17.0 g/dL Final  16/10/9602 54.0 13.0 - 17.7 g/dL Final   HCT  Date Value Ref Range Status  05/25/2022 49.1 39.0 - 52.0 % Final   Hematocrit  Date Value Ref Range Status  10/09/2019 49.7 37.5 - 51.0 % Final   MCHC  Date Value Ref Range Status  05/25/2022 33.4 30.0 - 36.0 g/dL Final    [facility]  Date Value Ref Range Status  05/25/2022 29.1 26.0 - 34.0 pg Final   MCV  Date Value Ref Range Status  05/25/2022 87.2 80.0 - 100.0 fL Final  10/09/2019 90 79 - 97 fL Final   No results found for: "PLTCOUNTKUC", "LABPLAT", "POCPLA" RDW  Date Value Ref Range Status  05/25/2022 12.0 11.5 -  15.5 % Final  10/09/2019 13.0 11.6 - 15.4 % Final         Passed - Cr in normal range and within 360 days    Creat  Date Value Ref Range Status  12/07/2021 0.63 (L) 0.70 - 1.35 mg/dL Final   Creatinine, Ser  Date Value Ref Range Status  05/25/2022 0.84 0.61 - 1.24 mg/dL Final   Creatinine, Urine  Date Value Ref Range Status  12/14/2021 52 20 - 320 mg/dL Final         Passed - eGFR in normal range and within 360 days    GFR calc Af Amer  Date Value Ref Range Status  10/09/2019 114 >59 mL/min/1.73 Final    Comment:    **[facility] currently reports eGFR in compliance with the current**   recommendations of the [employer]. [facility] will   update reporting as new guidelines are published from the NKF-ASN   Task force.    GFR, Estimated  Date Value Ref Range Status  05/25/2022 >60 >60 mL/min Final    Comment:    (NOTE) Calculated using the CKD-EPI Creatinine Equation (2021)    eGFR  Date Value Ref Range Status  12/07/2021 106 > OR = 60 mL/min/1.72m2 Final         Passed - Valid encounter within last 6 months    Recent Outpatient Visits           1 month ago Type 2 diabetes mellitus with other specified complication, without long-term current use of insulin (HCC)   [facility] Delles, Gentry Fitz A, RPH-CPP   2 months ago Type 2 diabetes mellitus with other specified complication, without long-term current use of insulin [facility])   [place] [facility] Delles, Gentry Fitz A, RPH-CPP   3 months ago Allergic sinusitis   [facility], Netta Neat, DO   3 months ago Type 2 diabetes  mellitus with other specified complication, without long-term current use of insulin [facility])   [place] [facility] Delles, Gentry Fitz A, RPH-CPP   4 months ago Type 2 diabetes mellitus with other specified complication, without long-term current use of insulin (HCC)   [place] [facility] Delles, Jackelyn Poling, RPH-CPP       Future Appointments             In 3 weeks Althea Charon, Netta Neat, DO [place] [facility]

## 2022-12-21 DIAGNOSIS — M545 Low back pain, unspecified: Secondary | ICD-10-CM | POA: Diagnosis not present

## 2022-12-21 DIAGNOSIS — M9903 Segmental and somatic dysfunction of lumbar region: Secondary | ICD-10-CM | POA: Diagnosis not present

## 2022-12-21 DIAGNOSIS — M9904 Segmental and somatic dysfunction of sacral region: Secondary | ICD-10-CM | POA: Diagnosis not present

## 2022-12-21 DIAGNOSIS — M9905 Segmental and somatic dysfunction of pelvic region: Secondary | ICD-10-CM | POA: Diagnosis not present

## 2022-12-22 ENCOUNTER — Other Ambulatory Visit: Payer: PPO

## 2022-12-22 DIAGNOSIS — E1169 Type 2 diabetes mellitus with other specified complication: Secondary | ICD-10-CM

## 2022-12-22 DIAGNOSIS — R351 Nocturia: Secondary | ICD-10-CM

## 2022-12-22 DIAGNOSIS — I10 Essential (primary) hypertension: Secondary | ICD-10-CM

## 2022-12-22 DIAGNOSIS — Z0000 Encounter for general adult medical examination without abnormal findings: Secondary | ICD-10-CM | POA: Diagnosis not present

## 2022-12-22 DIAGNOSIS — E785 Hyperlipidemia, unspecified: Secondary | ICD-10-CM | POA: Diagnosis not present

## 2022-12-23 LAB — COMPLETE METABOLIC PANEL WITH GFR
AG Ratio: 1.6 (calc) (ref 1.0–2.5)
ALT: 21 U/L (ref 9–46)
AST: 15 U/L (ref 10–35)
Albumin: 4.4 g/dL (ref 3.6–5.1)
Alkaline phosphatase (APISO): 68 U/L (ref 35–144)
BUN: 14 mg/dL (ref 7–25)
CO2: 27 mmol/L (ref 20–32)
Calcium: 9.4 mg/dL (ref 8.6–10.3)
Chloride: 102 mmol/L (ref 98–110)
Creat: 0.72 mg/dL (ref 0.70–1.35)
Globulin: 2.8 g/dL (ref 1.9–3.7)
Glucose, Bld: 223 mg/dL — ABNORMAL HIGH (ref 65–99)
Potassium: 4.6 mmol/L (ref 3.5–5.3)
Sodium: 139 mmol/L (ref 135–146)
Total Bilirubin: 1 mg/dL (ref 0.2–1.2)
Total Protein: 7.2 g/dL (ref 6.1–8.1)
eGFR: 101 mL/min/{1.73_m2} (ref >=60)

## 2022-12-23 LAB — PSA: PSA: 0.6 ng/mL (ref <=4.00)

## 2022-12-23 LAB — LIPID PANEL
Cholesterol: 214 mg/dL — ABNORMAL HIGH (ref <200)
HDL: 55 mg/dL (ref >=40)
LDL Cholesterol (Calc): 134 mg/dL — ABNORMAL HIGH
Non-HDL Cholesterol (Calc): 159 mg/dL — ABNORMAL HIGH (ref <130)
Total CHOL/HDL Ratio: 3.9 (calc) (ref <5.0)
Triglycerides: 141 mg/dL (ref <150)

## 2022-12-23 LAB — CBC WITH DIFFERENTIAL/PLATELET
Absolute Lymphocytes: 1245 {cells}/uL (ref 850–3900)
Absolute Monocytes: 240 {cells}/uL (ref 200–950)
Basophils Absolute: 69 {cells}/uL (ref 0–200)
Basophils Relative: 1.4 %
Eosinophils Absolute: 299 {cells}/uL (ref 15–500)
Eosinophils Relative: 6.1 %
HCT: 48.8 % (ref 38.5–50.0)
Hemoglobin: 16.2 g/dL (ref 13.2–17.1)
MCH: 29.6 pg (ref 27.0–33.0)
MCHC: 33.2 g/dL (ref 32.0–36.0)
MCV: 89.2 fL (ref 80.0–100.0)
MPV: 10.8 fL (ref 7.5–12.5)
Monocytes Relative: 4.9 %
Neutro Abs: 3048 {cells}/uL (ref 1500–7800)
Neutrophils Relative %: 62.2 %
Platelets: 195 10*3/uL (ref 140–400)
RBC: 5.47 10*6/uL (ref 4.20–5.80)
RDW: 11.8 % (ref 11.0–15.0)
Total Lymphocyte: 25.4 %
WBC: 4.9 10*3/uL (ref 3.8–10.8)

## 2022-12-23 LAB — HEMOGLOBIN A1C
Hgb A1c MFr Bld: 9.8 %{Hb} — ABNORMAL HIGH (ref <5.7)
Mean Plasma Glucose: 235 mg/dL
eAG (mmol/L): 13 mmol/L

## 2022-12-23 LAB — TSH: TSH: 3.9 m[IU]/L (ref 0.40–4.50)

## 2022-12-26 DIAGNOSIS — H52223 Regular astigmatism, bilateral: Secondary | ICD-10-CM | POA: Diagnosis not present

## 2022-12-26 DIAGNOSIS — H35412 Lattice degeneration of retina, left eye: Secondary | ICD-10-CM | POA: Diagnosis not present

## 2022-12-26 DIAGNOSIS — H40023 Open angle with borderline findings, high risk, bilateral: Secondary | ICD-10-CM | POA: Diagnosis not present

## 2022-12-26 DIAGNOSIS — E119 Type 2 diabetes mellitus without complications: Secondary | ICD-10-CM | POA: Diagnosis not present

## 2022-12-26 DIAGNOSIS — H524 Presbyopia: Secondary | ICD-10-CM | POA: Diagnosis not present

## 2022-12-26 DIAGNOSIS — H11052 Peripheral pterygium, progressive, left eye: Secondary | ICD-10-CM | POA: Diagnosis not present

## 2022-12-26 LAB — HM DIABETES EYE EXAM

## 2022-12-28 ENCOUNTER — Encounter: Payer: Self-pay | Admitting: Family Medicine

## 2022-12-29 ENCOUNTER — Ambulatory Visit (INDEPENDENT_AMBULATORY_CARE_PROVIDER_SITE_OTHER): Payer: PPO | Admitting: Family Medicine

## 2022-12-29 ENCOUNTER — Encounter: Payer: Self-pay | Admitting: Family Medicine

## 2022-12-29 VITALS — BP 120/74 | HR 88 | Ht 70.0 in | Wt 199.0 lb

## 2022-12-29 DIAGNOSIS — Z23 Encounter for immunization: Secondary | ICD-10-CM | POA: Diagnosis not present

## 2022-12-29 DIAGNOSIS — I10 Essential (primary) hypertension: Secondary | ICD-10-CM | POA: Diagnosis not present

## 2022-12-29 DIAGNOSIS — Z0000 Encounter for general adult medical examination without abnormal findings: Secondary | ICD-10-CM | POA: Diagnosis not present

## 2022-12-29 DIAGNOSIS — E1169 Type 2 diabetes mellitus with other specified complication: Secondary | ICD-10-CM

## 2022-12-29 DIAGNOSIS — E785 Hyperlipidemia, unspecified: Secondary | ICD-10-CM

## 2022-12-29 MED ORDER — METFORMIN HCL ER 500 MG PO TB24: 1000.0000 mg | ORAL_TABLET | Freq: Two times a day (BID) | ORAL | 1 refills | Status: DC

## 2022-12-29 NOTE — Progress Notes (Signed)
Subjective:    Patient ID: Philip Poet., male    DOB: [birth year], 67 y.o.   MRN: [id number]  Philip Hueston. is a 67 y.o. male presenting on 12/29/2022 for Annual Exam   HPI  Discussed the use of AI scribe software for clinical note transcription with the patient, who gave verbal consent to proceed.         S/p Carpal Tunnel Surgery, Left Dr Martha Clan [facility], surgery on 06/02/22 with good results, still healing, wearing brace   CHRONIC DM, Type 2: Elevated A1c 9.8 Admits increased intake diet not intact Metformin XR 500mg  x 2 = 1000mg  TWICE A DAY He is off GLP1, mounjaro, farxiga, xigduo Unable to continue GLP due to coverage gap w medicare DM [facility] at [facility] Denies hypoglycemia, polyuria, visual changes, numbness or tingling.   CHRONIC HTN: Reports controlled BP Current Meds - Amlodipine 10mg  daily, Telmisartan 80mg  daily   Reports good compliance, took meds today. Tolerating well, w/o complaints. Denies CP, dyspnea, HA, edema, dizziness / lightheadedness    HYPERLIPIDEMIA: - Reports no concerns. Last lipid panel 2024, reviewed with major improved cholesterol down from 166 to 118 LDL Not on statin therapy, has declined On ASA 81   Osteoarthritis Right Knee He has significant bone spurs. Prior Ortho, Dr Deeann Saint - Arthroscopic knee surgery 30-40 years ago.    Additional concerns. Left hand numbness paresthesia  Prior history of L Shoulder surgery    Health Maintenance:  Colonoscopy 04/29/20 negative, repeat 5 years or sooner.     12/29/2022    9:38 AM 12/14/2021    2:23 PM 09/07/2021    8:06 AM  Depression screen PHQ 2/9  Decreased Interest 0 0 0  Down, Depressed, Hopeless 0 0 0  PHQ - 2 Score 0 0 0  Altered sleeping  0 0  Tired, decreased energy  0 0  Change in appetite  0 0  Feeling bad or failure about yourself   0 0  Trouble concentrating  0 0  Moving slowly or fidgety/restless  0 0  Suicidal thoughts  0 0  PHQ-9 Score  0  0  Difficult doing work/chores  Not difficult at all Not difficult at all       12/29/2022    9:38 AM 12/14/2021    2:23 PM 09/07/2021    8:07 AM 10/09/2019    9:23 AM  GAD 7 : Generalized Anxiety Score  Nervous, Anxious, on Edge 0 0 0 0  Control/stop worrying 0 0 0 0  Worry too much - different things 0 0 0 0  Trouble relaxing 0 0 0 0  Restless 0 0 0 0  Easily annoyed or irritable 0 0 0 0  Afraid - awful might happen 0 0 0 0  Total GAD 7 Score 0 0 0 0  Anxiety Difficulty  Not difficult at all Not difficult at all      Past Medical History:  Diagnosis Date   Allergy    Carpal tunnel syndrome, left 04/2022   Diabetes mellitus type 2 with complications (HCC)    Hyperlipidemia    Hypertension    Sleep apnea    no CPAP   Past Surgical History:  Procedure Laterality Date   CARPAL TUNNEL RELEASE Left 06/02/2022   Procedure: CARPAL TUNNEL RELEASE;  Surgeon: Juanell Fairly, MD;  Location: [facility];  Service: Orthopedics;  Laterality: Left;   COLONOSCOPY  2022   KNEE ARTHROSCOPY Right 1987   cartilege  torn   SHOULDER ARTHROSCOPY WITH BICEPS TENDON REPAIR Left 08/29/2016   Procedure: SHOULDER ARTHROSCOPY WITH BICEPS TENDON REPAIR;  Surgeon: Juanell Fairly, MD;  Location: [facility];  Service: Orthopedics;  Laterality: Left;   SHOULDER ARTHROSCOPY WITH OPEN ROTATOR CUFF REPAIR AND DISTAL CLAVICLE ACROMINECTOMY Left 08/29/2016   Procedure: SHOULDER ARTHROSCOPY WITH OPEN ROTATOR CUFF REPAIR AND DISTAL CLAVICLE ACROMINECTOMY;  Surgeon: Juanell Fairly, MD;  Location: [facility];  Service: Orthopedics;  Laterality: Left;  Sleep Apnea - No CPAP   Social History   Socioeconomic History   Marital status: Married    Spouse name: Philip Ryan   Number of children: 3   Years of education: Not on file   Highest education level: Some college, no degree  Occupational History   Not on file  Tobacco Use   Smoking status: Former    Current packs/day: 0.00    Types:  Cigarettes    Quit date: 07/28/1974    Years since quitting: 48.4   Smokeless tobacco: Never  Vaping Use   Vaping status: Never Used  Substance and Sexual Activity   Alcohol use: Yes    Alcohol/week: 2.0 standard drinks of alcohol    Types: 2 Cans of beer per week    Comment: bi weekly use per pt.   Drug use: No   Sexual activity: Not on file  Other Topics Concern   Not on file  Social History Narrative   Not on file   Social Drivers of Health   Financial Resource Strain: Low Risk  (06/10/2022)   Overall Financial Resource Strain (CARDIA)    Difficulty of Paying Living Expenses: Not hard at all  Food Insecurity: Unknown (06/10/2022)   Hunger Vital Sign    Worried About Running Out of Food in the Last Year: Not on file    Ran Out of Food in the Last Year: Never true  Transportation Needs: No Transportation Needs (06/10/2022)   PRAPARE - Administrator, Civil Service (Medical): No    Lack of Transportation (Non-Medical): No  Physical Activity: Insufficiently Active (06/10/2022)   Exercise Vital Sign    Days of Exercise per Week: 3 days    Minutes of Exercise per Session: 30 min  Stress: No Stress Concern Present (06/10/2022)   [employer] of Occupational Health - Occupational Stress Questionnaire    Feeling of Stress : Not at all  Social Connections: Moderately Integrated (06/10/2022)   Social Connection and Isolation Panel [NHANES]    Frequency of Communication with Friends and Family: Three times a week    Frequency of Social Gatherings with Friends and Family: Three times a week    Attends Religious Services: More than 4 times per year    Active Member of Clubs or Organizations: No    Attends Engineer, structural: Not on file    Marital Status: Married  Catering manager Violence: Not on file   Family History  Problem Relation Age of Onset   Diabetes Mother    Colon cancer Mother    CAD Maternal Grandfather    Heart attack Maternal Grandfather     Cancer Maternal Grandfather    Colon cancer Maternal Grandfather    Esophageal cancer Neg Hx    Rectal cancer Neg Hx    Stomach cancer Neg Hx    Current Outpatient Medications on File Prior to Visit  Medication Sig   amLODipine (NORVASC) 10 MG tablet TAKE ONE TABLET BY MOUTH DAILY   aspirin EC  81 MG tablet Take 81 mg by mouth every other day. Swallow whole.   Blood Glucose Monitoring Suppl (ONETOUCH VERIO) w/Device KIT 1 Device by Does not apply route 3 (three) times daily.   glucose blood (ONETOUCH VERIO) test strip Check blood sugar 6 x daily   Multiple Vitamin (MULTIVITAMIN) tablet Take 1 tablet by mouth daily.   OneTouch Delica Lancets 33G MISC 1 Device by Does not apply route 3 (three) times daily as needed.   telmisartan (MICARDIS) 80 MG tablet TAKE ONE TABLET BY MOUTH DAILY   No current facility-administered medications on file prior to visit.    Review of Systems  Constitutional:  Negative for activity change, appetite change, chills, diaphoresis, fatigue and fever.  HENT:  Negative for congestion and hearing loss.   Eyes:  Negative for visual disturbance.  Respiratory:  Negative for cough, chest tightness, shortness of breath and wheezing.   Cardiovascular:  Negative for chest pain, palpitations and leg swelling.  Gastrointestinal:  Negative for abdominal pain, constipation, diarrhea, nausea and vomiting.  Genitourinary:  Negative for dysuria, frequency and hematuria.  Musculoskeletal:  Negative for arthralgias and neck pain.  Skin:  Negative for rash.  Neurological:  Negative for dizziness, weakness, light-headedness, numbness and headaches.  Hematological:  Negative for adenopathy.  Psychiatric/Behavioral:  Negative for behavioral problems, dysphoric mood and sleep disturbance.    Per HPI unless specifically indicated above     Objective:    BP 120/74   Pulse 88   Ht 5\' 10"  (1.778 m)   Wt 199 lb (90.3 kg)   BMI 28.55 kg/m   Wt Readings from Last 3 Encounters:   12/29/22 199 lb (90.3 kg)  06/14/22 197 lb (89.4 kg)  06/02/22 200 lb (90.7 kg)    Physical Exam Vitals and nursing note reviewed.  Constitutional:      General: He is not in acute distress.    Appearance: He is well-developed. He is not diaphoretic.     Comments: Well-appearing, comfortable, cooperative  HENT:     Head: Normocephalic and atraumatic.  Eyes:     General:        Right eye: No discharge.        Left eye: No discharge.     Conjunctiva/sclera: Conjunctivae normal.     Pupils: Pupils are equal, round, and reactive to light.  Neck:     Thyroid: No thyromegaly.     Vascular: No carotid bruit.  Cardiovascular:     Rate and Rhythm: Normal rate and regular rhythm.     Pulses: Normal pulses.     Heart sounds: Normal heart sounds. No murmur heard. Pulmonary:     Effort: Pulmonary effort is normal. No respiratory distress.     Breath sounds: Normal breath sounds. No wheezing or rales.  Abdominal:     General: Bowel sounds are normal. There is no distension.     Palpations: Abdomen is soft. There is no mass.     Tenderness: There is no abdominal tenderness.  Musculoskeletal:        General: No tenderness. Normal range of motion.     Cervical back: Normal range of motion and neck supple.     Right lower leg: No edema.     Left lower leg: No edema.     Comments: Upper / Lower Extremities: - Normal muscle tone, strength bilateral upper extremities 5/5, lower extremities 5/5  Lymphadenopathy:     Cervical: No cervical adenopathy.  Skin:    General: Skin  is warm and dry.     Findings: No erythema or rash.  Neurological:     Mental Status: He is alert and oriented to person, place, and time.     Comments: Distal sensation intact to light touch all extremities  Psychiatric:        Mood and Affect: Mood normal.        Behavior: Behavior normal.        Thought Content: Thought content normal.     Comments: Well groomed, good eye contact, normal speech and thoughts     Diabetic Foot Exam - Simple   Simple Foot Form Diabetic Foot exam was performed with the following findings: Yes 12/29/2022 10:10 AM  Visual Inspection See comments: Yes Sensation Testing Intact to touch and monofilament testing bilaterally: Yes Pulse Check Posterior Tibialis and Dorsalis pulse intact bilaterally: Yes Comments Left 2nd toe with joint deformity bulkiness. No significant callus or ulceration. Intact monofilament.      Results for orders placed or performed in visit on 12/28/22  HM DIABETES EYE EXAM   Collection Time: 12/26/22 12:00 AM  Result Value Ref Range   HM Diabetic Eye Exam No Retinopathy No Retinopathy      Assessment & Plan:   Problem List Items Addressed This Visit     Hyperlipidemia due to type 2 diabetes mellitus (HCC)   Relevant Medications   metFORMIN (GLUCOPHAGE-XR) 500 MG 24 hr tablet   Other Relevant Orders   CT CARDIAC SCORING (SELF PAY ONLY)   Hypertension   Type 2 diabetes mellitus with other specified complication (HCC)   Relevant Medications   metFORMIN (GLUCOPHAGE-XR) 500 MG 24 hr tablet   Other Relevant Orders   Urine Microalbumin w/creat. ratio   CT CARDIAC SCORING (SELF PAY ONLY)   Other Visit Diagnoses       Annual physical exam    -  Primary     Need for Streptococcus pneumoniae vaccination       Relevant Orders   Pneumococcal conjugate vaccine 20-valent (Completed)     Flu vaccine need       Relevant Orders   Flu Vaccine Trivalent High Dose (Fluad) (Completed)        Updated Health Maintenance information Reviewed recent lab results with patient Encouraged improvement to lifestyle with diet and exercise Goal of weight loss   Diabetes Mellitus, Type 2, uncontrolled Hyperglycemia A1c increased to 9.8 from 7.5. Patient reports increased food intake and attempts to reduce soda consumption. Currently on Metformin 500mg  twice daily. Patient expressed concerns about Metformin's potential kidney effects. Discussed  the benefits and risks of Metformin, and the possibility of adding other medications when insurance coverage is available.  -Continue Metformin XR 500mg  x2 = 1000mg  twice daily. -Consider Sulfonylurea, Insulin or other cost effective options, possibly Soliqua next -Plan to discuss with clinical pharmacist Gentry Fitz in the new year for potential medication adjustments given coverage gap / cost barriers  Hyperlipidemia Slight increase in cholesterol levels. LDL 134 Discussed the benefits and risks of statin therapy. Patient hesitant about starting statin therapy. The 10-year ASCVD risk score (Arnett DK, et al., 2019) is: 25.5%   Values used to calculate the score:     Age: 65 years     Sex: Male     Is Non-Hispanic African American: No     Diabetic: Yes     Tobacco smoker: No     Systolic Blood Pressure: 120 mmHg     Is BP treated: Yes  HDL Cholesterol: 55 mg/dL     Total Cholesterol: 214 mg/dL  -Order heart CT scan to assess coronary artery disease risk and to guide decision on statin therapy. -If scan indicates high risk, consider starting statin therapy. - he prefers Rosuvastatin.  General Health Maintenance -Administer influenza and pneumococcal vaccines today. -Order urine test to check kidney function. -Continue regular foot checks for diabetic neuropathy. -Continue regular colon cancer screenings (next due 2022 or sooner).  Postnasal Drip Patient reports a dry cough and throat drainage for the past few weeks. No other symptoms reported. -Recommend over-the-counter nasal sprays like Flonase or Nasacort to help dry up the drainage.      Route to [place] for scheduling in 2025 for DM med coverage   Orders Placed This Encounter  Procedures   CT CARDIAC SCORING (SELF PAY ONLY)    Standing Status:   Future    Expiration Date:   12/29/2023    Preferred imaging location?:   [facility]   Pneumococcal conjugate vaccine 20-valent   Flu Vaccine Trivalent High Dose  (Fluad)   Urine Microalbumin w/creat. ratio    Meds ordered this encounter  Medications   metFORMIN (GLUCOPHAGE-XR) 500 MG 24 hr tablet    Sig: Take 2 tablets (1,000 mg total) by mouth 2 (two) times daily with a meal.    Dispense:  360 tablet    Refill:  1     Follow up plan: Return in about 3 months (around 03/29/2023) for 3 month DM A1c, HLD, prefer AM.  Saralyn Pilar, DO [facility] 12/29/2022, 10:06 AM

## 2022-12-29 NOTE — Patient Instructions (Addendum)
Thank you for coming to the office today.  Recent Labs    05/25/22 1256 12/22/22 0751  HGBA1C 6.9* 9.8*   Gentry Fitz will call in 2025 to discuss coverage on med options!  You have been referred for a Coronary Calcium Score Cardiac CT Scan. This is a screening test for patients aged 66-50+ with cardiovascular risk factors or who are healthy but would be interested in Cardiovascular Screening for heart disease. Even if there is a family history of heart disease, this imaging can be useful. Typically it can be done every 5+ years or at a different timeline we agree on  The scan will look at the chest and mainly focus on the heart and identify early signs of calcium build up or blockages within the heart arteries. It is not 100% accurate for identifying blockages or heart disease, but it is useful to help [place] predict who may have some early changes or be at risk in the future for a heart attack or cardiovascular problem.  The results are reviewed by a Cardiologist and they will document the results. It should become available on MyChart. Typically the results are divided into percentiles based on other patients of the same demographic and age. So it will compare your risk to others similar to you. If you have a higher score >99 or higher percentile >75%tile, it is recommended to consider Statin cholesterol therapy and or referral to Cardiologist. I will try to help explain your results and if we have questions we can contact the Cardiologist.  You will be contacted for scheduling. Usually it is done at any imaging facility through [facility], [facility] or [facility].  The cost is $99 flat fee total and it does not go through insurance, so no authorization is required.   Please schedule a Follow-up Appointment to: Return in about 3 months (around 03/29/2023) for 3 month DM A1c, HLD, prefer AM.  If you have any other questions or concerns, please feel free to call  the office or send a message through MyChart. You may also schedule an earlier appointment if necessary.  Additionally, you may be receiving a survey about your experience at our office within a few days to 1 week by e-mail or mail. We value your feedback.  Saralyn Pilar, DO [facility], [place]

## 2022-12-30 ENCOUNTER — Telehealth: Payer: Self-pay | Admitting: Pharmacist

## 2022-12-30 NOTE — Telephone Encounter (Signed)
   Outreach Note  12/30/2022 Name: Philip Ryan. MRN: [id number] DOB: September 28, 1955  Referred by: Smitty Cords, DO  Receive a message from PCP requesting outreach to patient to schedule an appointment in January to discuss diabetes medication management/access  Was unable to reach patient via telephone today and have left HIPAA compliant voicemail asking patient to return my call.    Follow Up Plan: Will collaborate with Care Guide to outreach to schedule follow up with me  Estelle Grumbles, PharmD, P[place]y [place], CPP Clinical Pharmacist [facility] [phone]

## 2023-01-03 ENCOUNTER — Ambulatory Visit
Admission: RE | Admit: 2023-01-03 | Discharge: 2023-01-03 | Disposition: A | Discharge status: Home / Self Care | Payer: Self-pay | Source: Ambulatory Visit | Attending: Family Medicine | Admitting: Family Medicine

## 2023-01-03 ENCOUNTER — Other Ambulatory Visit: Payer: Self-pay | Admitting: Family Medicine

## 2023-01-03 DIAGNOSIS — E1169 Type 2 diabetes mellitus with other specified complication: Secondary | ICD-10-CM

## 2023-01-03 DIAGNOSIS — E785 Hyperlipidemia, unspecified: Secondary | ICD-10-CM | POA: Insufficient documentation

## 2023-01-04 LAB — MICROALBUMIN / CREATININE URINE RATIO
Creatinine, Urine: 57 mg/dL (ref 20–320)
Microalb Creat Ratio: 9 mg/g{creat} (ref <30)
Microalb, Ur: 0.5 mg/dL

## 2023-01-25 DIAGNOSIS — M9905 Segmental and somatic dysfunction of pelvic region: Secondary | ICD-10-CM | POA: Diagnosis not present

## 2023-01-25 DIAGNOSIS — M545 Low back pain, unspecified: Secondary | ICD-10-CM | POA: Diagnosis not present

## 2023-01-25 DIAGNOSIS — M9903 Segmental and somatic dysfunction of lumbar region: Secondary | ICD-10-CM | POA: Diagnosis not present

## 2023-01-25 DIAGNOSIS — M9904 Segmental and somatic dysfunction of sacral region: Secondary | ICD-10-CM | POA: Diagnosis not present

## 2023-01-30 ENCOUNTER — Other Ambulatory Visit: Payer: PPO | Admitting: Pharmacist

## 2023-01-30 DIAGNOSIS — E1169 Type 2 diabetes mellitus with other specified complication: Secondary | ICD-10-CM

## 2023-01-30 MED ORDER — ROSUVASTATIN CALCIUM 5 MG PO TABS: 5.0000 mg | ORAL_TABLET | Freq: Every day | ORAL | 1 refills | Status: DC

## 2023-01-30 NOTE — Progress Notes (Signed)
 01/30/2023 Name: Philip Ryan. MRN: [id number] DOB: 1955-07-06  Chief Complaint  Patient presents with   Medication Management   Medication Assistance    Philip Ryan. is a 68 y.o. year old male who presented for a telephone visit.   They were referred to the pharmacist by their PCP for assistance in managing diabetes and medication access.      Subjective:   Care Team: Primary Care Provider: [name], DO ; Next Scheduled Visit: 03/30/2023  Medication Access/Adherence  Current Pharmacy:  [facility] - [place], [place] - [address] [place] [address] Phone: [phone] Fax: [phone]  [facility] [address], [place] - [address] [place] [address] Phone: [phone] Fax: [phone]   Patient reports affordability concerns with their medications: Yes  Patient reports access/transportation concerns to their pharmacy: No  Patient reports adherence concerns with their medications:  No        Diabetes:   Current medications:  metformin  ER 500 mg - 2 tablets (1000 mg) twice daily with meals             Reports tolerating well   Medications previously tried: metformin  IR, Xigduo  (cost); Mounjaro  (cost); Ozempic  (cost)   Denies checking home blood sugar recently   Patient denies hypoglycemic s/sx including dizziness, shakiness, sweating.    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, but planning to start stretching/light exercise in afternoons   Current medication access support: none   Objective:  Lab Results  Component Value Date   HGBA1C 9.8 (H) 12/22/2022    Lab Results  Component Value Date   CREATININE 0.72 12/22/2022   BUN 14 12/22/2022   NA 139 12/22/2022   K 4.6 12/22/2022   CL 102 12/22/2022   CO2 27 12/22/2022    Lab Results  Component Value Date   CHOL 214 (H) 12/22/2022    HDL 55 12/22/2022   LDLCALC 134 (H) 12/22/2022   TRIG 141 12/22/2022   CHOLHDL 3.9 12/22/2022    Medications Reviewed Today     Reviewed by Alana Sharyle LABOR, RPH-CPP (Pharmacist) on 01/30/23 at 1113  Med List Status: <None>   Medication Order Taking? Sig Documenting Provider Last Dose Status Informant  amLODipine  (NORVASC ) 10 MG tablet [id number]  TAKE ONE TABLET BY MOUTH DAILY [name], DO  Active   aspirin EC 81 MG tablet [id number] Yes Take 81 mg by mouth daily. Swallow whole. [provider] Taking Active   Blood Glucose Monitoring Suppl (ONETOUCH VERIO) w/Device KIT [id number]  1 Device by Does not apply route 3 (three) times daily. [name], NP  Active   glucose blood [facility] VERIO) test strip [id number]  Check blood sugar 6 x daily [name], NP  Active   metFORMIN  (GLUCOPHAGE -XR) 500 MG 24 hr tablet [id number] Yes Take 2 tablets (1,000 mg total) by mouth 2 (two) times daily with a meal. [name], DO Taking Active   Multiple Vitamin (MULTIVITAMIN) tablet [id number]  Take 1 tablet by mouth daily. [provider]  Active   OneTouch Delica Lancets 33G MISC [id number]  1 Device by Does not apply route 3 (three) times daily as needed. [name], NP  Active   rosuvastatin  (CRESTOR ) 5 MG tablet [id number] Yes Take 1 tablet (5 mg total) by mouth daily. [name], DO  Active  telmisartan  (MICARDIS ) 80 MG tablet [id number]  TAKE ONE TABLET BY MOUTH DAILY Edman, Marsa PARAS, DO  Active               Assessment/Plan:   Diabetes: - Currently uncontrolled - Reviewed long term cardiovascular and renal outcomes of uncontrolled blood sugar - Reviewed dietary modifications including importance of having regular well-balanced meals and snacks throughout the day, while controlling carbohydrate portion sizes  Encourage patient to increase consumption of non-starchy vegetables - Patient plans to work on  gradually increasing his exercise - Recommend to check glucose, keep log of results and have this record to review during upcoming appointments - Discuss medication management options. Reports his income has changed and thinks that he may now qualify for patient assistance for Ozempic  from [employer] As requested, schedule follow up appointment to discuss once patient will have a chance to review his latest household income documents  - Counsel on statin therapy for LDL control/ASCVD risk reduction Patient agreeable to try rosuvastatin  CPP sends prescription for rosuvastatin  5 mg daily to pharmacy for patient     Follow Up Plan: Clinical Pharmacist will follow up with patient by telephone on 02/17/2023 at 8:00 AM   Sharyle Sia, PharmD, JAQUELINE, CPP Clinical Pharmacist [facility] [phone]

## 2023-01-30 NOTE — Patient Instructions (Signed)
 Goals Addressed             This Visit's Progress    Pharmacy Goals       Our goal A1c is less than 7%. This corresponds with fasting sugars less than 130 and 2 hour after meal sugars less than 180. Please keep a log of your results when checking your blood sugar   Our goal bad cholesterol, or LDL, is less than 70 . This is why it is important to take your rosuvastatin daily  Thank you!  Estelle Grumbles, PharmD, [facility] Clinical Pharmacist [facility] [phone]

## 2023-02-17 ENCOUNTER — Other Ambulatory Visit: Payer: PPO | Admitting: Pharmacist

## 2023-02-17 DIAGNOSIS — E1169 Type 2 diabetes mellitus with other specified complication: Secondary | ICD-10-CM

## 2023-02-17 MED ORDER — OZEMPIC (0.25 OR 0.5 MG/DOSE) 2 MG/3ML ~~LOC~~ SOPN: 0.5000 mg | PEN_INJECTOR | SUBCUTANEOUS | 1 refills | Status: DC

## 2023-02-17 MED ORDER — OZEMPIC (0.25 OR 0.5 MG/DOSE) 2 MG/3ML ~~LOC~~ SOPN: PEN_INJECTOR | SUBCUTANEOUS | 0 refills | Status: DC

## 2023-02-17 NOTE — Progress Notes (Unsigned)
02/18/2023 Name: Philip Ryan. MRN: [id number] DOB: 18-Oct-1955  Chief Complaint  Patient presents with   Medication Assistance   Medication Management    Philip Ellen. is a 68 y.o. year old male who presented for a telephone visit.   They were referred to the pharmacist by their PCP for assistance in managing diabetes and medication access.      Subjective:   Care Team: Primary Care Provider: Smitty Cords, DO ; Next Scheduled Visit: 03/30/2023  Medication Access/Adherence  Current Pharmacy:  [facility] - [place], [place] - [address] [place] [address] Phone: [phone] Fax: [phone]  [facility] [address], [place] - [address] [place] [address] Phone: [phone] Fax: [phone]   Patient reports affordability concerns with their medications: Yes  Patient reports access/transportation concerns to their pharmacy: No  Patient reports adherence concerns with their medications:  No       Diabetes:   Current medications:  - metformin ER 500 mg - 2 tablets (1000 mg) twice daily with meals             Reports tolerating well   Medications previously tried: metformin IR, Xigduo (cost); Mounjaro (cost); Ozempic (cost)   Denies checking home blood sugar recently   Patient denies hypoglycemic s/sx including dizziness, shakiness, sweating.    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 Sig Documenting Provider Last Dose Status Informant  amLODipine (NORVASC) 10 MG tablet [id number]  TAKE ONE TABLET BY MOUTH DAILY Smitty Cords, DO  Active   aspirin EC 81 MG tablet [id number]  Take 81 mg by mouth daily. Swallow whole. [provider]  Active   Blood Glucose Monitoring Suppl (ONETOUCH VERIO) w/Device KIT [id number]  1 Device by Does not apply route 3 (three) times daily. Lorre Munroe, NP  Active   glucose blood [facility] VERIO) test strip [id number]  Check blood sugar 6 x daily Lorre Munroe, NP  Active   metFORMIN (GLUCOPHAGE-XR) 500 MG 24 hr tablet [id number] Yes Take 2 tablets (1,000 mg total) by mouth 2 (two) times daily with a meal. Smitty Cords, DO Taking Active   Multiple Vitamin (MULTIVITAMIN) tablet [id number]  Take 1 tablet by mouth daily. [provider]  Active   OneTouch Delica Lancets 33G MISC [id number]  1 Device by Does not apply route 3 (three) times daily as needed. Lorre Munroe, NP  Active   rosuvastatin (CRESTOR) 5 MG tablet [id number]  Take 1 tablet (5 mg total) by mouth daily. Karamalegos, Netta Neat, DO  Active   Semaglutide,0.25 or 0.5MG /DOS, (OZEMPIC, 0.25 OR 0.5 MG/DOSE,) 2  MG/3ML SOPN [id number]  Inject 0.5 mg into the skin once a week. (Start with 0.25 mg weekly for the first 4 weeks) Althea Charon, Netta Neat, DO  Active   telmisartan (MICARDIS) 80 MG tablet [id number]  TAKE ONE TABLET BY MOUTH DAILY Althea Charon Netta Neat, DO  Active               Assessment/Plan:   Diabetes: - Currently uncontrolled - Reviewed long term cardiovascular and renal outcomes of uncontrolled blood sugar - Reviewed dietary modifications including importance of having regular well-balanced meals and snacks throughout the day,  while controlling carbohydrate portion sizes             Encourage patient to increase consumption of non-starchy vegetables - Patient plans to work on gradually increasing his exercise - Recommend to check glucose, keep log of results and have this record to review during upcoming appointments - Discuss medication management options. Reports has reviewed his income and is not yet sure if he meets the income requirement for [employer] patient assistance, but will let me know if he finds that he meets this in the future - Review coverage information for Ozempic for patient's prescription plan per [employer] website. Note in 2025 Medicare no longer has a coverage gap. Ozempic is covered as a tier 3 option for patient and his tier 3 copayment is $47/month or $117.50 for up to 100 day supply Patient states the monthly copayment is affordable to him and would like to restart Ozempic - Submit PA request for Ozempic for patient via covermymeds. Request approved through 02/17/2024  CPP will send prescription to pharmacy Patient aware of plan to restart Ozempic with dose of 0.25 mg under the skin once weekly for 4 weeks and then increase to Ozempic 0.5 mg weekly     Follow Up Plan: Clinical Pharmacist will follow up with patient by telephone on  03/24/2023 at 8:00 AM    Estelle Grumbles, PharmD, P[place]y [place], CPP Clinical Pharmacist [facility] [phone]

## 2023-02-18 NOTE — Patient Instructions (Signed)
Goals Addressed             This Visit's Progress    Pharmacy Goals       Our goal A1c is less than 7%. This corresponds with fasting sugars less than 130 and 2 hour after meal sugars less than 180. Please keep a log of your results when checking your blood sugar   Our goal bad cholesterol, or LDL, is less than 70 . This is why it is important to take your rosuvastatin daily  Thank you!  Estelle Grumbles, PharmD, [facility] Clinical Pharmacist [facility] [phone]

## 2023-02-22 DIAGNOSIS — M9903 Segmental and somatic dysfunction of lumbar region: Secondary | ICD-10-CM | POA: Diagnosis not present

## 2023-02-22 DIAGNOSIS — M9904 Segmental and somatic dysfunction of sacral region: Secondary | ICD-10-CM | POA: Diagnosis not present

## 2023-02-22 DIAGNOSIS — M545 Low back pain, unspecified: Secondary | ICD-10-CM | POA: Diagnosis not present

## 2023-02-22 DIAGNOSIS — M9905 Segmental and somatic dysfunction of pelvic region: Secondary | ICD-10-CM | POA: Diagnosis not present

## 2023-03-22 ENCOUNTER — Telehealth: Payer: Self-pay | Admitting: Pharmacist

## 2023-03-22 ENCOUNTER — Other Ambulatory Visit: Payer: PPO | Admitting: Pharmacist

## 2023-03-22 DIAGNOSIS — M9905 Segmental and somatic dysfunction of pelvic region: Secondary | ICD-10-CM | POA: Diagnosis not present

## 2023-03-22 DIAGNOSIS — M9904 Segmental and somatic dysfunction of sacral region: Secondary | ICD-10-CM | POA: Diagnosis not present

## 2023-03-22 DIAGNOSIS — E119 Type 2 diabetes mellitus without complications: Secondary | ICD-10-CM

## 2023-03-22 DIAGNOSIS — E1169 Type 2 diabetes mellitus with other specified complication: Secondary | ICD-10-CM

## 2023-03-22 DIAGNOSIS — M9903 Segmental and somatic dysfunction of lumbar region: Secondary | ICD-10-CM | POA: Diagnosis not present

## 2023-03-22 DIAGNOSIS — M545 Low back pain, unspecified: Secondary | ICD-10-CM | POA: Diagnosis not present

## 2023-03-22 NOTE — Progress Notes (Signed)
 03/22/2023 Name: Philip Ryan. MRN: [id number] DOB: 12-Jan-1956  Chief Complaint  Patient presents with   Medication Management    Philip Ryan. is a 68 y.o. year old male who presented for a telephone visit.   They were referred to the pharmacist by their PCP for assistance in managing diabetes and medication access.      Subjective:   Care Team: Primary Care Provider: Smitty Cords, DO ; Next Scheduled Visit: 03/30/2023  Medication Access/Adherence  Current Pharmacy:  [facility] - [place], [place] - [address] [place] [address] Phone: [phone] Fax: [phone]  [facility] [address], [place] - [address] [place] [address] Phone: [phone] Fax: [phone]   Patient reports affordability concerns with their medications: No Patient reports access/transportation concerns to their pharmacy: No  Patient reports adherence concerns with their medications:  No       Diabetes:   Current medications:  - metformin ER 500 mg - 2 tablets (1000 mg) twice daily with meals - Ozempic 0.5 mg weekly on Mondays (increased to current dose on 3/3)  Reports tolerating well   Medications previously tried: metformin IR, Xigduo (cost); Mounjaro (cost); Ozempic (cost)   Denies checking home blood sugar recently as misplaced his glucometer. - Patient declines need for new glucometer prescription as states he thinks that he knows where current one is   Patient denies hypoglycemic s/sx including dizziness, shakiness, sweating.    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 Sig Documenting Provider Last Dose Status Informant  amLODipine (NORVASC) 10 MG tablet [id number]  TAKE ONE TABLET BY MOUTH DAILY Smitty Cords, DO  Active   aspirin EC 81 MG tablet [id number]  Take 81 mg by mouth daily. Swallow whole. [provider]  Active   Blood Glucose Monitoring Suppl (ONETOUCH VERIO) w/Device KIT [id number]  1 Device by Does not apply route 3 (three) times daily. Lorre Munroe, NP  Active   glucose blood [facility] VERIO) test strip [id number]  Check blood sugar 6 x daily Lorre Munroe, NP  Active   metFORMIN (GLUCOPHAGE-XR) 500 MG 24 hr tablet [id number] Yes Take 2 tablets (1,000 mg total) by mouth 2 (two) times daily with a meal. Smitty Cords, DO Taking Active   Multiple Vitamin (MULTIVITAMIN) tablet [id number]  Take 1 tablet by mouth daily. [provider]  Active   OneTouch Delica Lancets 33G MISC [id number]  1 Device by Does not apply route 3 (three) times daily as needed. Lorre Munroe, NP  Active   rosuvastatin (CRESTOR) 5 MG tablet [id number] Yes Take 1 tablet (5 mg total)  by mouth daily. Smitty Cords, DO Taking Active   Semaglutide,0.25 or 0.5MG /DOS, (OZEMPIC, 0.25 OR 0.5 MG/DOSE,) 2 MG/3ML SOPN [id number] Yes Inject 0.5 mg into the skin once a week. (Start with 0.25 mg weekly for the first 4 weeks) Althea Charon, Netta Neat, DO Taking Active   telmisartan (MICARDIS) 80 MG tablet [id number]  TAKE ONE TABLET BY MOUTH DAILY Althea Charon Netta Neat, DO  Active               Assessment/Plan:   Diabetes: - Currently uncontrolled - Reviewed long term  cardiovascular and renal outcomes of uncontrolled blood sugar - Reviewed dietary modifications including importance of having regular well-balanced meals and snacks throughout the day, while controlling carbohydrate portion sizes - Recommend to restart checking glucose, keep log of results and have this record to review during upcoming appointments     Follow Up Plan: Clinical Pharmacist will follow up with patient by telephone on  05/03/2023 at 10:00 AM     Estelle Grumbles, PharmD, P[place]y [place], CPP Clinical Pharmacist [facility] [phone]

## 2023-03-22 NOTE — Telephone Encounter (Signed)
   Outreach Note  03/22/2023 Name: Robbi Scurlock. MRN: [id number] DOB: [birth year]  Referred by: Smitty Cords, DO Reason for referral : Medication Management   Was unable to reach patient via telephone today and have left HIPAA compliant voicemail asking patient to return my call.   Follow Up Plan: Will collaborate with Care Guide to outreach to schedule follow up with me  Estelle Grumbles, PharmD, [place]y [place], CPP Clinical Pharmacist [facility] [phone]

## 2023-03-22 NOTE — Patient Instructions (Addendum)
 Goals Addressed             This Visit's Progress    Pharmacy Goals       Our goal A1c is less than 7%. This corresponds with fasting sugars less than 130 and 2 hour after meal sugars less than 180. Please keep a log of your results when checking your blood sugar   Our goal bad cholesterol, or LDL, is less than 70 . This is why it is important to take your rosuvastatin daily  Thank you!  Estelle Grumbles, PharmD, [facility] Clinical Pharmacist [facility] [phone]

## 2023-03-24 ENCOUNTER — Other Ambulatory Visit: Payer: PPO

## 2023-03-30 ENCOUNTER — Encounter: Payer: Self-pay | Admitting: Family Medicine

## 2023-03-30 ENCOUNTER — Ambulatory Visit (INDEPENDENT_AMBULATORY_CARE_PROVIDER_SITE_OTHER): Payer: PPO | Admitting: Family Medicine

## 2023-03-30 VITALS — BP 120/70 | HR 96 | Ht 70.0 in | Wt 196.0 lb

## 2023-03-30 DIAGNOSIS — Z7985 Long-term (current) use of injectable non-insulin antidiabetic drugs: Secondary | ICD-10-CM

## 2023-03-30 DIAGNOSIS — E1169 Type 2 diabetes mellitus with other specified complication: Secondary | ICD-10-CM | POA: Diagnosis not present

## 2023-03-30 LAB — POCT GLYCOSYLATED HEMOGLOBIN (HGB A1C): Hemoglobin A1C: 8.9 % — AB (ref 4.0–5.6)

## 2023-03-30 NOTE — Patient Instructions (Addendum)
 Thank you for coming to the office today.  Recent Labs    05/25/22 1256 12/22/22 0751 03/30/23 0905  HGBA1C 6.9* 9.8* 8.9*   Gentry Fitz will call on 4/16  Keep going on Ozempic 0.5mg  weekly  If we have it, free sample today, should last 4 weeks.   Please schedule a Follow-up Appointment to: Return in about 5 months (around 08/30/2023) for 5+ months for DM A1c, and Triamcinolone 40mg  IM injection for allergies.  If you have any other questions or concerns, please feel free to call the office or send a message through MyChart. You may also schedule an earlier appointment if necessary.  Additionally, you may be receiving a survey about your experience at our office within a few days to 1 week by e-mail or mail. We value your feedback.  Saralyn Pilar, DO [facility], [place]

## 2023-03-30 NOTE — Progress Notes (Signed)
 Subjective:    Patient ID: Philip Poet., male    DOB: [birth year], 68 y.o.   MRN: [id number]  Philip Domagala. is a 68 y.o. male presenting on 03/30/2023 for Diabetes   HPI  Discussed the use of AI scribe software for clinical note transcription with the patient, who gave verbal consent to proceed.  History of Present Illness   The patient presents for follow-up on diabetes management and medication review.      CHRONIC DM, Type 2: Improved A1c today Improved lifestyle His last A1c was 9.8% three months ago, and it has now decreased to 8.9%. He attributes this improvement to a change in his eating habits and an increase in his Ozempic dosage from 0.25 mg to 0.5 mg weekly. He is currently using Ozempic, which is partially covered by insurance, costing him about sixty dollars. He has enough medication for the current month. Has Ozempic 0.5mg  weekly x 4 doses, finishing through March 2025 Metformin XR 500mg  x 2 = 1000mg  TWICE A DAY [facility] Denies hypoglycemia, polyuria, visual changes, numbness or tingling.    Osteoarthritis Right Knee He has significant bone spurs. Prior Ortho, Dr Deeann Saint - Arthroscopic knee surgery 30-40 years ago.  considering knee replacement in future  Asking advice on providers         03/30/2023    8:58 AM 12/29/2022    9:38 AM 12/14/2021    2:23 PM  Depression screen PHQ 2/9  Decreased Interest 0 0 0  Down, Depressed, Hopeless 0 0 0  PHQ - 2 Score 0 0 0  Altered sleeping 0  0  Tired, decreased energy 0  0  Change in appetite 0  0  Feeling bad or failure about yourself  0  0  Trouble concentrating 0  0  Moving slowly or fidgety/restless 0  0  Suicidal thoughts 0  0  PHQ-9 Score 0  0  Difficult doing work/chores   Not difficult at all       03/30/2023    8:58 AM 12/29/2022    9:38 AM 12/14/2021    2:23 PM 09/07/2021    8:07 AM  GAD 7 : Generalized Anxiety Score  Nervous, Anxious, on Edge 0 0 0 0  Control/stop  worrying 0 0 0 0  Worry too much - different things 0 0 0 0  Trouble relaxing 0 0 0 0  Restless 0 0 0 0  Easily annoyed or irritable 0 0 0 0  Afraid - awful might happen 0 0 0 0  Total GAD 7 Score 0 0 0 0  Anxiety Difficulty   Not difficult at all Not difficult at all    Social History   Tobacco Use   Smoking status: Former    Current packs/day: 0.00    Types: Cigarettes    Quit date: 07/28/1974    Years since quitting: 48.7   Smokeless tobacco: Never  Vaping Use   Vaping status: Never Used  Substance Use Topics   Alcohol use: Yes    Alcohol/week: 2.0 standard drinks of alcohol    Types: 2 Cans of beer per week    Comment: bi weekly use per pt.   Drug use: No    Review of Systems Per HPI unless specifically indicated above     Objective:    BP 120/70   Pulse 96   Ht 5\' 10"  (1.778 m)   Wt 196 lb (88.9 kg)   SpO2  95%   BMI 28.12 kg/m   Wt Readings from Last 3 Encounters:  03/30/23 196 lb (88.9 kg)  12/29/22 199 lb (90.3 kg)  06/14/22 197 lb (89.4 kg)    Physical Exam Vitals and nursing note reviewed.  Constitutional:      General: He is not in acute distress.    Appearance: Normal appearance. He is well-developed. He is not diaphoretic.     Comments: Well-appearing, comfortable, cooperative  HENT:     Head: Normocephalic and atraumatic.  Eyes:     General:        Right eye: No discharge.        Left eye: No discharge.     Conjunctiva/sclera: Conjunctivae normal.  Cardiovascular:     Rate and Rhythm: Normal rate.  Pulmonary:     Effort: Pulmonary effort is normal.  Skin:    General: Skin is warm and dry.     Findings: No erythema or rash.  Neurological:     Mental Status: He is alert and oriented to person, place, and time.  Psychiatric:        Mood and Affect: Mood normal.        Behavior: Behavior normal.        Thought Content: Thought content normal.     Comments: Well groomed, good eye contact, normal speech and thoughts     Results for  orders placed or performed in visit on 03/30/23  POCT HgB A1C   Collection Time: 03/30/23  9:05 AM  Result Value Ref Range   Hemoglobin A1C 8.9 (A) 4.0 - 5.6 %   HbA1c POC (<> result, manual entry)     HbA1c, POC (prediabetic range)     HbA1c, POC (controlled diabetic range)        Assessment & Plan:   Problem List Items Addressed This Visit     Type 2 diabetes mellitus with other specified complication (HCC) - Primary   Relevant Orders   POCT HgB A1C (Completed)   Other Visit Diagnoses       Long-term current use of injectable noninsulin antidiabetic medication           Type 2 Diabetes Mellitus HbA1c improved from 9.8% to 8.9%. Current Ozempic dose expected to enhance glycemic control. - Continue Ozempic 0.5 mg weekly. - Provide a sample of Ozempic for one month. It is affordable for him as well and he will continue on Ozempic - Schedule follow-up call with Gentry Fitz on April 16th to discuss medication coverage and dosage. He may benefit from dose inc to 1mg  Ozempic in near future - Encourage continued dietary modifications.  Chronic Knee Pain Intermittent knee pain. Considering knee replacement surgery. Referred to orthopedic surgeons for evaluation. - Provide information about orthopedic surgeons Dr. Odis Luster and Dr. Joice Lofts for knee evaluation and potential surgery.  General Health Maintenance Recent screenings satisfactory. Liver function and heart scan normal. Blood pressure controlled at 120/70 mmHg. - Schedule follow-up appointment in late August, avoiding Mondays and Fridays, to coincide with allergy shot. - Document appointment details in MyChart for his access.         Orders Placed This Encounter  Procedures   POCT HgB A1C    No orders of the defined types were placed in this encounter.  Route to Estelle Grumbles [facility] CPP for Clinical Pharmacy  updates  Follow up plan: Return in about 5 months (around 08/30/2023) for 5+ months for DM A1c, and  Triamcinolone 40mg  IM injection for allergies.  Saralyn Pilar, DO [facility] 03/30/2023, 9:01 AM

## 2023-04-19 DIAGNOSIS — M9903 Segmental and somatic dysfunction of lumbar region: Secondary | ICD-10-CM | POA: Diagnosis not present

## 2023-04-19 DIAGNOSIS — M9904 Segmental and somatic dysfunction of sacral region: Secondary | ICD-10-CM | POA: Diagnosis not present

## 2023-04-19 DIAGNOSIS — M9905 Segmental and somatic dysfunction of pelvic region: Secondary | ICD-10-CM | POA: Diagnosis not present

## 2023-04-19 DIAGNOSIS — M545 Low back pain, unspecified: Secondary | ICD-10-CM | POA: Diagnosis not present

## 2023-05-03 ENCOUNTER — Other Ambulatory Visit: Admitting: Pharmacist

## 2023-05-03 DIAGNOSIS — Z7985 Long-term (current) use of injectable non-insulin antidiabetic drugs: Secondary | ICD-10-CM

## 2023-05-03 DIAGNOSIS — E1169 Type 2 diabetes mellitus with other specified complication: Secondary | ICD-10-CM

## 2023-05-03 MED ORDER — OZEMPIC (0.25 OR 0.5 MG/DOSE) 2 MG/3ML ~~LOC~~ SOPN: 0.5000 mg | PEN_INJECTOR | SUBCUTANEOUS | 1 refills | Status: DC

## 2023-05-03 NOTE — Patient Instructions (Signed)
 Goals Addressed             This Visit's Progress    Pharmacy Goals       Our goal A1c is less than 7%. This corresponds with fasting sugars less than 130 and 2 hour after meal sugars less than 180. Please keep a log of your results when checking your blood sugar   Our goal bad cholesterol, or LDL, is less than 70 . This is why it is important to take your rosuvastatin daily  Thank you!  Estelle Grumbles, PharmD, [facility] Clinical Pharmacist [facility] [phone]

## 2023-05-03 NOTE — Progress Notes (Signed)
 05/03/2023 Name: Philip Ryan. MRN: [id number] DOB: Jan 31, 1955  Chief Complaint  Patient presents with   Medication Management    Philip Ryan. is a 68 y.o. year old male who presented for a telephone visit.   They were referred to the pharmacist by their PCP for assistance in managing diabetes and medication access.    Subjective:   Care Team: Primary Care Provider: Smitty Cords, DO ; Next Scheduled Visit: 08/31/2023  Medication Access/Adherence  Current Pharmacy:  [facility] - [place], [place] - [address] [place] [address] Phone: [phone] Fax: [phone]  [facility] [address] [place] - [address] [place] [address] Phone: [phone] Fax: [phone]   Patient reports affordability concerns with their medications: No Patient reports access/transportation concerns to their pharmacy: No  Patient reports adherence concerns with their medications:  No       Diabetes:   Current medications:  - metformin ER 500 mg - 2 tablets (1000 mg) twice daily with meals - Ozempic 0.5 mg weekly on Mondays (increased to current dose on 3/3)             Reports tolerating well; denies side effects   Medications previously tried: metformin IR, Xigduo (cost); Mounjaro (cost); Ozempic (cost)    Current home blood sugar readings: Last checked 4/14: ~1 hour after supper (steak, potatoes and salad), reading: 189   Patient denies hypoglycemic s/sx including dizziness, shakiness, sweating.   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 Sig Documenting Provider Last Dose Status Informant  amLODipine (NORVASC) 10 MG tablet [id number]  TAKE ONE TABLET BY MOUTH DAILY Smitty Cords, DO  Active   aspirin EC 81 MG tablet [id number]  Take 81 mg by mouth daily. Swallow whole. [provider]  Active   Blood Glucose Monitoring Suppl (ONETOUCH VERIO) w/Device KIT [id number]  1 Device by Does not apply route 3 (three) times daily. Lorre Munroe, NP  Active   glucose blood [facility] VERIO) test strip [id number]  Check blood sugar 6 x daily Lorre Munroe, NP  Active   metFORMIN (GLUCOPHAGE-XR) 500 MG 24 hr tablet [id number] Yes Take 2 tablets (1,000 mg total) by mouth 2 (two) times daily with a meal. Smitty Cords, DO Taking Active   Multiple Vitamin (MULTIVITAMIN) tablet [id number]  Take 1 tablet by mouth daily. [provider]  Active   OneTouch Delica Lancets 33G MISC [id number]  1 Device by Does not apply route 3 (three) times daily as needed. Lorre Munroe, NP  Active   rosuvastatin (CRESTOR) 5 MG tablet [id number]  Take 1 tablet (5 mg total) by mouth daily. [name], DO  Active   Semaglutide,0.25 or 0.5MG /DOS, (OZEMPIC, 0.25 OR 0.5 MG/DOSE,) 2 MG/3ML SOPN [id number]  Inject 0.5 mg into the skin once a week. [name], [name], DO  Active   telmisartan (MICARDIS) 80 MG tablet [id number]  TAKE ONE TABLET BY MOUTH DAILY [name], [name], DO  Active               Assessment/Plan:   Diabetes: - Currently uncontrolled, but improved based on latest A1C - Reviewed  long term cardiovascular and renal outcomes of uncontrolled blood sugar - Reviewed goal A1c, goal fasting, and goal 2 hour post prandial glucose - Reviewed dietary modifications including importance of having regular well-balanced meals and snacks throughout the day, while controlling carbohydrate portion sizes - Counsel patient on use of continuous glucose monitoring; patient denies interest in using CGM - Patient denies interest in adjusting Ozempic dose today, but request to discuss again next month  CPP sends renewal of Ozempic 0.5 mg weekly prescription to pharmacy for patient - Recommend to check glucose, keep log of results and have this record to review during upcoming appointments  Encourage patient to use blood sugar readings as feedback on dietary choices     Follow Up Plan: Clinical Pharmacist will follow up with patient by telephone on 05/29/2023 at 8:30 AM     [name], PharmD, Becky Bowels, CPP Clinical Pharmacist [facility] [phone]

## 2023-05-08 ENCOUNTER — Other Ambulatory Visit: Payer: Self-pay | Admitting: Family Medicine

## 2023-05-08 DIAGNOSIS — E1169 Type 2 diabetes mellitus with other specified complication: Secondary | ICD-10-CM

## 2023-05-09 NOTE — Telephone Encounter (Signed)
 Requested by interface surescritps. Last labs 12/22/22.  Requested Prescriptions  Pending Prescriptions Disp Refills   rosuvastatin  (CRESTOR ) 5 MG tablet [Pharmacy Med Name: ROSUVASTATIN  CALCIUM  5 MG TAB] 90 tablet 1    Sig: TAKE 1 TABLET BY MOUTH DAILY     Cardiovascular:  Antilipid - Statins 2 Failed - 05/09/2023 12:25 PM      Failed - Lipid Panel in normal range within the last 12 months    Cholesterol, Total  Date Value Ref Range Status  10/09/2019 239 (H) 100 - 199 mg/dL Final   Cholesterol  Date Value Ref Range Status  12/22/2022 214 (H) <200 mg/dL Final   Cholesterol Piccolo, Waived  Date Value Ref Range Status  02/05/2018 218 (H) <200 mg/dL Final    Comment:                            Desirable                <200                         Borderline High      200- 239                         High                     >239    LDL Cholesterol (Calc)  Date Value Ref Range Status  12/22/2022 134 (H) mg/dL (calc) Final    Comment:    Reference range: <100 . Desirable range <100 mg/dL for primary prevention;   <70 mg/dL for patients with CHD or diabetic patients  with > or = 2 CHD risk factors. Aaron Aas LDL-C is now calculated using the Martin-Hopkins  calculation, which is a validated novel method providing  better accuracy than the Friedewald equation in the  estimation of LDL-C.  Melinda Sprawls et al. Erroll Heard. 4098;119(14): 2061-2068  (http://education.[facility].com/faq/FAQ164)    HDL  Date Value Ref Range Status  12/22/2022 55 > OR = 40 mg/dL Final  78/29/5621 52 >30 mg/dL Final   Triglycerides  Date Value Ref Range Status  12/22/2022 141 <150 mg/dL Final   Triglycerides Piccolo,Waived  Date Value Ref Range Status  02/05/2018 150 (H) <150 mg/dL Final    Comment:                            Normal                   <150                         Borderline High     150 - 199                         High                200 - 499                         Very High                 >499          Passed - Cr in normal range and within 360 days  Creat  Date Value Ref Range Status  12/22/2022 0.72 0.70 - 1.35 mg/dL Final   Creatinine, Urine  Date Value Ref Range Status  01/03/2023 57 20 - 320 mg/dL Final         Passed - Patient is not pregnant      Passed - Valid encounter within last 12 months    Recent Outpatient Visits           1 month ago Type 2 diabetes mellitus with other specified complication, without long-term current use of insulin [facility])   [place] [facility] [place], [name], [place]

## 2023-05-22 ENCOUNTER — Ambulatory Visit: Payer: Self-pay

## 2023-05-22 NOTE — Telephone Encounter (Signed)
 Chief Complaint: tick bite Symptoms: tenderness, redness Frequency: since Friday, removed yesterday afternoon Pertinent Negatives: Patient denies all other symptoms Disposition: [] ED /[] Urgent Care (no appt availability in office) / [x] Appointment(In office/virtual)/ []  [place] [facility]/ [] Home Care/ [] Refused Recommended Disposition /[]  Mobile Bus/ []  Follow-up with PCP Additional Notes: Pt reports he removed two ticks from his body yesterday afternoon. On Friday pt noticed what he thought may have been a tick on his L shoulder. His wife thought it was a scab. Then, yesterday afternoon pt found what was clearly a tick on his R hip/groin area. Pt removed it. Pt then realized the "scab" on his L shoulder was a ticket and he removed it. Pt states he has 1/10 tenderness to his R groin and a "little red bump." Denies heat. Denies bulls eye rash or widespread rash. Denies H/A, fever, chills, N/V, neck stiffness, muscle aches or joint pain. Pt concerned about contracting Lyme disease. RN scheduled pt for 0840 tomorrow AM due to the area being red and tender. RN advised pt if he develops fever, chills, H/A, N/V, neck stiffness, body aches etc to go to the ED. Pt verbalized understanding.    Copied from CRM [id number]. Topic: Clinical - Red Word Triage >> May 22, 2023  3:35 PM Alysia Jumbo S wrote: [facility] that prompted transfer to Nurse Triage: tick bite-back lt shoulder, rt hip. Ticks were on for at least 36 hours before patient removed them. Reason for Disposition  [1] Red or very tender (to touch) area AND [2] started over 24 hours after the bite  Answer Assessment - Initial Assessment Questions 1. ATTACHED:  "Is the tick still on the skin?"  (e.g., yes, no, unsure)     No  2. ONSET - TICK STILL ATTACHED:  "How long do you think the tick has been on your skin?" (e.g., hours, days, unsure)  Note:  Is there a recent activity (camping, hiking) where the caller may have been exposed?      N/A 3. ONSET - TICK NOT STILL ATTACHED: "If the tick has been removed, how long do you think the tick was attached before you removed it?" (e.g., 5 hours, 2 days). "When was this?"     Removed them both yesterday afternoon - first tick noticed on Friday 4. LOCATION: "Where is the tick bite located?" (e.g., arm, leg)     L shoulder and R hip  5. TYPE of TICK: "Is it a wood tick or a deer tick?" (e.g., deer tick, wood tick; unsure)     "They small" "one is a whole lot smaller than the other" 6. SIZE of TICK: "How big is the tick?" (e.g., size of poppy seed, apple seed, watermelon seed; unsure) Note: Deer ticks can be the size of a poppy seed (nymph) or an apple seed (adult).       "Probably the big one ain't much better than a fountain pen", pin head 7. ENGORGED: "Did the tick look flat or engorged (full, swollen)?" (e.g., flat, engorged; unsure)     "The bigger one had some blood in him" 8. OTHER SYMPTOMS: "Do you have any other symptoms?" (e.g., fever, rash, redness at bite area, red ring around bite)     Noticed one on Friday but his wife thought it was a scab (L shoulder). Pt states the tick on his R hip he did not notice until yesterday afternoon. Pt states he removed the tick on his R hip and then went back and removed the  tick on  his L shoulder.  Area on R hip is a "little bit tender" and there is a "little red bump." (1/10 pain). States he can't see the area good. "It's discolored"   L shoulder - "I can't see it" "it really doesn't bother me"  Pt states he showered after removing them and used antibiotic cream and bandaids  Protocols used: Tick Bite-A-AH

## 2023-05-23 ENCOUNTER — Ambulatory Visit: Admitting: Family Medicine

## 2023-05-23 ENCOUNTER — Encounter: Payer: Self-pay | Admitting: Family Medicine

## 2023-05-23 VITALS — BP 130/78 | HR 100 | Ht 70.0 in | Wt 200.2 lb

## 2023-05-23 DIAGNOSIS — L989 Disorder of the skin and subcutaneous tissue, unspecified: Secondary | ICD-10-CM | POA: Diagnosis not present

## 2023-05-23 DIAGNOSIS — S40262A Insect bite (nonvenomous) of left shoulder, initial encounter: Secondary | ICD-10-CM

## 2023-05-23 DIAGNOSIS — S70361A Insect bite (nonvenomous), right thigh, initial encounter: Secondary | ICD-10-CM | POA: Diagnosis not present

## 2023-05-23 DIAGNOSIS — D485 Neoplasm of uncertain behavior of skin: Secondary | ICD-10-CM

## 2023-05-23 DIAGNOSIS — W57XXXA Bitten or stung by nonvenomous insect and other nonvenomous arthropods, initial encounter: Secondary | ICD-10-CM

## 2023-05-23 MED ORDER — DOXYCYCLINE HYCLATE 100 MG PO TABS: 200.0000 mg | ORAL_TABLET | Freq: Once | ORAL | 0 refills | Status: AC

## 2023-05-23 NOTE — Patient Instructions (Addendum)
 Thank you for coming to the office today.  No major concern today. The tick bite spots look well healed and just localized reaction  No sign of the bulls eye, erythema migrans rash.  Keep an eye on all bites and symptoms  Start Doxy 200mg  x 1 dose only for prevention.  Take with full glass of water and stay upright for at least 30 min after taking, may be seated or standing, but should NOT lay down. This is just a safety precaution, if this medicine does not go all the way down throat well it could cause some burning discomfort to throat and esophagus. Also NOTE - do not take medicine within 2 hours (before or after) consuming dairy or foods / vitamins containing high calcium  or iron.  If you develop the spreading bulls eye rash, and or other symptoms headache nausea vomiting fever joint pain body aches etc, then we need to know ASAP, and we can extend the treatment up to 14-21 days  And test the blood within 2-3 weeks of today.  Recommend Dermatologist for the spot on the R side of the back / shoulder, looks concerning for pre-cancer or early stage localized skin cancer.   Please schedule a Follow-up Appointment to: Return if symptoms worsen or fail to improve.  If you have any other questions or concerns, please feel free to call the office or send a message through MyChart. You may also schedule an earlier appointment if necessary.  Additionally, you may be receiving a survey about your experience at our office within a few days to 1 week by e-mail or mail. We value your feedback.  Domingo Friend, DO [facility], [place]

## 2023-05-23 NOTE — Progress Notes (Signed)
 Subjective:    Patient ID: Philip Ryan., male    DOB: [birth year], 68 y.o.   MRN: [id number]  Philip Ryan. is a 68 y.o. male presenting on 05/23/2023 for Tick Bites  x2, and Abnormal Skin Spot  Patient presents for a same day appointment.  HPI  Discussed the use of AI scribe software for clinical note transcription with the patient, who gave verbal consent to proceed.  History of Present Illness   Philip Ryan. is a 67 year old male who presents with tick bites on the right groin and left shoulder.  He removed two ticks from his body on Sunday, which he believes attached on Friday. One tick was located on his right groin and the other on his posterior left shoulder. He initially mistook the tick on his shoulder for a scab. After discovering the ticks, he applied an antibiotic ointment to both sites and covered them with a Band-Aid, although he did not use a Band-Aid on the groin area. The tick bite sites have a single red spot without any bull's eye rash. No spreading rash, headache, nausea, vomiting, neck stiffness, body aches, or chills. He has been applying the antibiotic ointment regularly since discovering the bites.  Additionally, he mentions a non-healing lesion on his right posterior shoulder that has persisted for several weeks. He describes it as a spot that 'won't heal' and has a 'little residue' in it. He recalls having moles removed in the past, which were tested and found to be benign.  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 Pending this summer.          05/23/2023    8:36 AM 03/30/2023    8:58 AM 12/29/2022    9:38 AM  Depression screen PHQ 2/9  Decreased Interest 0 0 0  Down, Depressed, Hopeless 0 0 0  PHQ - 2 Score 0 0 0  Altered sleeping 0 0   Tired, decreased energy 0 0   Change in appetite 0 0   Feeling bad or failure about yourself  0 0   Trouble concentrating 0 0   Moving slowly or fidgety/restless 0 0   Suicidal  thoughts 0 0   PHQ-9 Score 0 0        05/23/2023    8:36 AM 03/30/2023    8:58 AM 12/29/2022    9:38 AM 12/14/2021    2:23 PM  GAD 7 : Generalized Anxiety Score  Nervous, Anxious, on Edge 0 0 0 0  Control/stop worrying 0 0 0 0  Worry too much - different things 0 0 0 0  Trouble relaxing 0 0 0 0  Restless 0 0 0 0  Easily annoyed or irritable 0 0 0 0  Afraid - awful might happen 0 0 0 0  Total GAD 7 Score 0 0 0 0  Anxiety Difficulty    Not difficult at all    Social History   Tobacco Use   Smoking status: Former    Current packs/day: 0.00    Types: Cigarettes    Quit date: 07/28/1974    Years since quitting: 48.8   Smokeless tobacco: Never  Vaping Use   Vaping status: Never Used  Substance Use Topics   Alcohol use: Yes    Alcohol/week: 2.0 standard drinks of alcohol    Types: 2 Cans of beer per week    Comment: bi weekly use per pt.   Drug use: No  Review of Systems Per HPI unless specifically indicated above     Objective:    BP 130/78 (BP Location: Left Arm, Patient Position: Sitting, Cuff Size: Normal)   Pulse 100   Ht 5\' 10"  (1.778 m)   Wt 200 lb 4 oz (90.8 kg)   SpO2 98%   BMI 28.73 kg/m   Wt Readings from Last 3 Encounters:  05/23/23 200 lb 4 oz (90.8 kg)  03/30/23 196 lb (88.9 kg)  12/29/22 199 lb (90.3 kg)    Physical Exam Vitals and nursing note reviewed.  Constitutional:      General: He is not in acute distress.    Appearance: Normal appearance. He is well-developed. He is not diaphoretic.     Comments: Well-appearing, comfortable, cooperative  HENT:     Head: Normocephalic and atraumatic.  Eyes:     General:        Right eye: No discharge.        Left eye: No discharge.     Conjunctiva/sclera: Conjunctivae normal.  Cardiovascular:     Rate and Rhythm: Normal rate.  Pulmonary:     Effort: Pulmonary effort is normal.  Skin:    General: Skin is warm and dry.     Findings: Lesion (3 skin spots. Left upper posterior shoulder tick bite,  localized erythema only. R upper thigh anterior groin with minimal local erythema. and R upper shoulder back posterior ulcerated non healing lesion.) present. No erythema or rash.  Neurological:     Mental Status: He is alert and oriented to person, place, and time.  Psychiatric:        Mood and Affect: Mood normal.        Behavior: Behavior normal.        Thought Content: Thought content normal.     Comments: Well groomed, good eye contact, normal speech and thoughts     LEFT SHOULDER Tick Bite    RIGHT GROIN INGUINAL / UPPER THIGH Tick Bite     RIGHT POSTERIOR SHOULDER / BACK - NON HEALING LESION     Results for orders placed or performed in visit on 03/30/23  POCT HgB A1C   Collection Time: 03/30/23  9:05 AM  Result Value Ref Range   Hemoglobin A1C 8.9 (A) 4.0 - 5.6 %   HbA1c POC (<> result, manual entry)     HbA1c, POC (prediabetic range)     HbA1c, POC (controlled diabetic range)        Assessment & Plan:   Problem List Items Addressed This Visit   None Visit Diagnoses       Non-healing skin lesion    -  Primary     Tick bite of left shoulder, initial encounter       Relevant Medications   doxycycline (VIBRA-TABS) 100 MG tablet     Tick bite of right thigh, initial encounter       Relevant Medications   doxycycline (VIBRA-TABS) 100 MG tablet     Skin sore            Tick bites with localized reaction only Recent tick bites with localized reaction (L upper back shoulder posterior and R groin upper thigh) no systemic symptoms or erythema migrans.  Prophylactic doxycycline recommended.  - Prescribe doxycycline 200 mg as a single dose for Lyme disease prophylaxis. - Instruct to avoid dairy, calcium , and iron for 2 hours before and after taking doxycycline. - Advise to monitor for symptoms such as spreading rash, headache, nausea,  vomiting, fever, joint pain, or body aches. - If symptoms develop, initiate a full course of antibiotics (14-21 days) and test  for Lyme disease antibodies in 2-3 weeks.      Non-healing skin lesion on right posterior shoulder Persistent non-healing lesion with characteristics suggestive of precancerous or early-stage localized skin cancer Differential includes basal cell carcinoma or squamous cell carcinoma. Early detection and treatment emphasized. - Already has Dermatologist, has upcoming apt as scheduled, he is asked to discuss with them to get skin biopsy next   No orders of the defined types were placed in this encounter.   Meds ordered this encounter  Medications   doxycycline (VIBRA-TABS) 100 MG tablet    Sig: Take 2 tablets (200 mg total) by mouth once for 1 dose. For Lyme prophylaxis    Dispense:  2 tablet    Refill:  0    Follow up plan: Return if symptoms worsen or fail to improve.  Domingo Friend, DO [facility] 05/23/2023, 9:01 AM

## 2023-05-24 DIAGNOSIS — M545 Low back pain, unspecified: Secondary | ICD-10-CM | POA: Diagnosis not present

## 2023-05-24 DIAGNOSIS — M9905 Segmental and somatic dysfunction of pelvic region: Secondary | ICD-10-CM | POA: Diagnosis not present

## 2023-05-24 DIAGNOSIS — M9903 Segmental and somatic dysfunction of lumbar region: Secondary | ICD-10-CM | POA: Diagnosis not present

## 2023-05-24 DIAGNOSIS — M9904 Segmental and somatic dysfunction of sacral region: Secondary | ICD-10-CM | POA: Diagnosis not present

## 2023-05-29 ENCOUNTER — Other Ambulatory Visit: Admitting: Pharmacist

## 2023-05-29 DIAGNOSIS — D225 Melanocytic nevi of trunk: Secondary | ICD-10-CM | POA: Diagnosis not present

## 2023-05-29 DIAGNOSIS — D2261 Melanocytic nevi of right upper limb, including shoulder: Secondary | ICD-10-CM | POA: Diagnosis not present

## 2023-05-29 DIAGNOSIS — L570 Actinic keratosis: Secondary | ICD-10-CM | POA: Diagnosis not present

## 2023-05-29 DIAGNOSIS — D2262 Melanocytic nevi of left upper limb, including shoulder: Secondary | ICD-10-CM | POA: Diagnosis not present

## 2023-05-29 DIAGNOSIS — D2272 Melanocytic nevi of left lower limb, including hip: Secondary | ICD-10-CM | POA: Diagnosis not present

## 2023-05-29 DIAGNOSIS — D2271 Melanocytic nevi of right lower limb, including hip: Secondary | ICD-10-CM | POA: Diagnosis not present

## 2023-05-29 DIAGNOSIS — L821 Other seborrheic keratosis: Secondary | ICD-10-CM | POA: Diagnosis not present

## 2023-05-29 DIAGNOSIS — C44519 Basal cell carcinoma of skin of other part of trunk: Secondary | ICD-10-CM | POA: Diagnosis not present

## 2023-05-29 DIAGNOSIS — Z7985 Long-term (current) use of injectable non-insulin antidiabetic drugs: Secondary | ICD-10-CM

## 2023-05-29 DIAGNOSIS — E1169 Type 2 diabetes mellitus with other specified complication: Secondary | ICD-10-CM

## 2023-05-29 DIAGNOSIS — D485 Neoplasm of uncertain behavior of skin: Secondary | ICD-10-CM | POA: Diagnosis not present

## 2023-05-29 MED ORDER — OZEMPIC (1 MG/DOSE) 4 MG/3ML ~~LOC~~ SOPN: 1.0000 mg | PEN_INJECTOR | SUBCUTANEOUS | 1 refills | Status: DC

## 2023-05-29 NOTE — Patient Instructions (Signed)
 Goals Addressed             This Visit's Progress    Pharmacy Goals       Our goal A1c is less than 7%. This corresponds with fasting sugars less than 130 and 2 hour after meal sugars less than 180. Please keep a log of your results when checking your blood sugar   Our goal bad cholesterol, or LDL, is less than 70 . This is why it is important to take your rosuvastatin daily  Thank you!  Estelle Grumbles, PharmD, [facility] Clinical Pharmacist [facility] [phone]

## 2023-05-29 NOTE — Progress Notes (Signed)
 05/29/2023 Name: Philip J Godlewski Jr. MRN: [id number] DOB: [birth year]  Chief Complaint  Patient presents with   Medication Management   Medication Assistance   Medication Adherence    Philip J Brodersen Jr. is a 68 y.o. year old male who presented for a telephone visit.   They were referred to the pharmacist by their PCP for assistance in managing diabetes and medication access.    Subjective:   Care Team: Primary Care Provider: [name], DO ; Next Scheduled Visit: 08/31/2023  Medication Access/Adherence  Current Pharmacy:  [facility] - [place], [place] - [address] [place] [address] Phone: [phone] Fax: [phone]  [facility] [address] [place] - [address] [place] [address] Phone: [phone] Fax: [phone]   Patient reports affordability concerns with their medications: No Patient reports access/transportation concerns to their pharmacy: No  Patient reports adherence concerns with their medications:  No     From review of chart, note patient seen for Office Visit with PCP on 5/6 related to Tick Bites x2 and provider prescribed doxycycline  200 mg as a single dose for Lyme disease prophylaxis   Today patient confirms completed prophylactic dose of doxycycline  and denies having any rash or other symptoms since  Confirms scheduled to see dermatologist (today) as recommended by PCP    Diabetes:   Current medications:  - metformin  ER 500 mg - 2 tablets (1000 mg) twice daily with meals - Ozempic  0.5 mg weekly on Mondays (increased to current dose on 3/3)             Reports tolerating well; denies side effects   Medications previously tried: metformin  IR, Xigduo  (cost); Mounjaro  (cost); Ozempic  (cost)     Current home blood sugar readings throughout the day ranging 178-222   Patient denies hypoglycemic s/sx including dizziness, shakiness, sweating.    Admits to drinking  [employer] fountain drink sometimes twice daily.    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 Sig Documenting Provider Last Dose Status Informant  amLODipine  (NORVASC ) 10 MG tablet [id number]  TAKE ONE TABLET BY MOUTH DAILY [name], DO  Active   aspirin EC 81 MG tablet [id number]  Take 81 mg by mouth daily. Swallow whole. [provider]  Active   Blood Glucose Monitoring Suppl (ONETOUCH VERIO) w/Device KIT [id number]  1 Device by Does not apply route 3 (three) times daily. [name], NP  Active   glucose blood [facility] VERIO) test strip [id number]  Check blood sugar 6 x daily [name], NP  Active   metFORMIN  (GLUCOPHAGE -XR) 500 MG 24 hr tablet [id number] Yes Take 2 tablets (1,000  mg total) by mouth 2 (two) times daily with a meal. [name], [name], DO Taking Active   Multiple Vitamin (MULTIVITAMIN) tablet [id number]  Take 1 tablet by mouth daily. [provider]  Active   OneTouch Delica Lancets 33G MISC [id number]  1 Device by Does not apply route 3 (three) times daily as needed. [name], NP  Active   rosuvastatin  (CRESTOR ) 5 MG tablet [id number]  TAKE 1 TABLET BY MOUTH DAILY Karamalegos, [name], DO  Active   Semaglutide ,0.25 or 0.5MG /DOS, (OZEMPIC , 0.25 OR 0.5 MG/DOSE,) 2  MG/3ML SOPN [id number] Yes Inject 0.5 mg into the skin once a week. [name], DO Taking Active   telmisartan  (MICARDIS ) 80 MG tablet [id number]  TAKE ONE TABLET BY MOUTH DAILY [name] [name], DO  Active               Assessment/Plan:   Diabetes: - Currently uncontrolled, but improved based on latest A1C - Reviewed long term cardiovascular and renal outcomes of uncontrolled blood sugar - Reviewed goal A1c, goal fasting, and goal 2 hour post prandial glucose - Reviewed dietary modifications including importance of having regular well-balanced meals and snacks throughout the day, while controlling carbohydrate portion sizes  Discuss eating more non-starchy vegetables in place of potatoes Encourage patient to reduce/avoid consumption of sugary beverages, drinking water instead  Encourage to review nutrition labels for carbohydrate content of foods - Discuss option to increase Ozempic  dose. Patient reports has just picked up a 47-month refill of Ozempic  0.5 mg, but interested in making this dose change with next refill             CPP sends prescription for Ozempic  1 mg weekly prescription to pharmacy for patient - Recommend to check glucose, keep log of results and have this record to review during upcoming appointments             Encourage patient to use blood sugar readings as feedback on dietary choices     Follow Up Plan: Clinical Pharmacist will follow up with patient by telephone on 07/24/2023 at 8:30 AM      [name], PharmD, Becky Bowels, CPP Clinical Pharmacist [facility] [phone]

## 2023-06-08 DIAGNOSIS — C44519 Basal cell carcinoma of skin of other part of trunk: Secondary | ICD-10-CM | POA: Diagnosis not present

## 2023-06-19 DIAGNOSIS — M9905 Segmental and somatic dysfunction of pelvic region: Secondary | ICD-10-CM | POA: Diagnosis not present

## 2023-06-19 DIAGNOSIS — M9904 Segmental and somatic dysfunction of sacral region: Secondary | ICD-10-CM | POA: Diagnosis not present

## 2023-06-19 DIAGNOSIS — M545 Low back pain, unspecified: Secondary | ICD-10-CM | POA: Diagnosis not present

## 2023-06-19 DIAGNOSIS — M9903 Segmental and somatic dysfunction of lumbar region: Secondary | ICD-10-CM | POA: Diagnosis not present

## 2023-07-19 DIAGNOSIS — M9905 Segmental and somatic dysfunction of pelvic region: Secondary | ICD-10-CM | POA: Diagnosis not present

## 2023-07-19 DIAGNOSIS — M9903 Segmental and somatic dysfunction of lumbar region: Secondary | ICD-10-CM | POA: Diagnosis not present

## 2023-07-19 DIAGNOSIS — M9904 Segmental and somatic dysfunction of sacral region: Secondary | ICD-10-CM | POA: Diagnosis not present

## 2023-07-19 DIAGNOSIS — M545 Low back pain, unspecified: Secondary | ICD-10-CM | POA: Diagnosis not present

## 2023-07-24 ENCOUNTER — Other Ambulatory Visit

## 2023-07-24 ENCOUNTER — Telehealth: Payer: Self-pay

## 2023-07-24 NOTE — Progress Notes (Signed)
 Complex Care Management Care Guide Note  07/24/2023 Name: Philip Ryan. MRN: [id number] DOB: 26-Nov-1955  [name]. is a 68 y.o. year old male who is a primary care patient of Edman Marsa JINNY, DO and is actively engaged with the care management team. I reached out to [employer] by phone today to assist with re-scheduling  with the Pharmacist.  Follow up plan: Unsuccessful telephone outreach attempt made. A HIPAA compliant phone message was left for the patient providing contact information and requesting a return call.  Jeoffrey Buffalo , RMA     [facility]  [facility] Guide  Direct Dial: [phone]  Website: [email or web address]

## 2023-08-01 ENCOUNTER — Other Ambulatory Visit: Payer: Self-pay | Admitting: Family Medicine

## 2023-08-01 DIAGNOSIS — E1169 Type 2 diabetes mellitus with other specified complication: Secondary | ICD-10-CM

## 2023-08-03 ENCOUNTER — Other Ambulatory Visit: Payer: Self-pay | Admitting: Family Medicine

## 2023-08-03 DIAGNOSIS — E1169 Type 2 diabetes mellitus with other specified complication: Secondary | ICD-10-CM

## 2023-08-03 DIAGNOSIS — I10 Essential (primary) hypertension: Secondary | ICD-10-CM

## 2023-08-03 MED ORDER — TELMISARTAN 80 MG PO TABS: 80.0000 mg | ORAL_TABLET | Freq: Every day | ORAL | 3 refills | Status: AC

## 2023-08-03 NOTE — Telephone Encounter (Signed)
 A1C in date  Requested Prescriptions  Pending Prescriptions Disp Refills   OZEMPIC , 1 MG/DOSE, 4 MG/3ML SOPN [Pharmacy Med Name: Ozempic  (1 MG/DOSE) 4 MG/3ML Subcutaneous Solution Pen-injector] 3 mL 0    Sig: INJECT 1MG  INTO THE SKIN ONCE WEEKLY     Endocrinology:  Diabetes - GLP-1 Receptor Agonists - semaglutide  Failed - 08/03/2023 11:39 AM      Failed - HBA1C in normal range and within 180 days    Hemoglobin A1C  Date Value Ref Range Status  03/30/2023 8.9 (A) 4.0 - 5.6 % Final  09/08/2015 7.8  Final   HB A1C (BAYER DCA - WAIVED)  Date Value Ref Range Status  10/09/2019 8.7 (H) <7.0 % Final    Comment:                                          Diabetic Adult            <7.0                                       Healthy Adult        4.3 - 5.7                                                           (DCCT/NGSP) American Diabetes Association's Summary of Glycemic Recommendations for Adults with Diabetes: Hemoglobin A1c <7.0%. More stringent glycemic goals (A1c <6.0%) may further reduce complications at the cost of increased risk of hypoglycemia.    Hgb A1c MFr Bld  Date Value Ref Range Status  12/22/2022 9.8 (H) <5.7 % of total Hgb Final    Comment:    For someone without known diabetes, a hemoglobin A1c value of 6.5% or greater indicates that they may have  diabetes and this should be confirmed with a follow-up  test. . For someone with known diabetes, a value <7% indicates  that their diabetes is well controlled and a value  greater than or equal to 7% indicates suboptimal  control. A1c targets should be individualized based on  duration of diabetes, age, comorbid conditions, and  other considerations. . Currently, no consensus exists regarding use of hemoglobin A1c for diagnosis of diabetes for children. .          Passed - Cr in normal range and within 360 days    Creat  Date Value Ref Range Status  12/22/2022 0.72 0.70 - 1.35 mg/dL Final   Creatinine, Urine   Date Value Ref Range Status  01/03/2023 57 20 - 320 mg/dL Final         Passed - Valid encounter within last 6 months    Recent Outpatient Visits           2 months ago Non-healing skin lesion   [place] [facility] [place], Marsa PARAS, DO   4 months ago Type 2 diabetes mellitus with other specified complication, without long-term current use of insulin [facility])   [place] [facility] [place], Marsa PARAS, [place]

## 2023-08-23 DIAGNOSIS — M9903 Segmental and somatic dysfunction of lumbar region: Secondary | ICD-10-CM | POA: Diagnosis not present

## 2023-08-23 DIAGNOSIS — M545 Low back pain, unspecified: Secondary | ICD-10-CM | POA: Diagnosis not present

## 2023-08-23 DIAGNOSIS — M9905 Segmental and somatic dysfunction of pelvic region: Secondary | ICD-10-CM | POA: Diagnosis not present

## 2023-08-23 DIAGNOSIS — M9904 Segmental and somatic dysfunction of sacral region: Secondary | ICD-10-CM | POA: Diagnosis not present

## 2023-08-27 ENCOUNTER — Other Ambulatory Visit: Payer: Self-pay | Admitting: Family Medicine

## 2023-08-27 DIAGNOSIS — E1169 Type 2 diabetes mellitus with other specified complication: Secondary | ICD-10-CM

## 2023-08-28 ENCOUNTER — Other Ambulatory Visit: Admitting: Pharmacist

## 2023-08-28 DIAGNOSIS — Z7985 Long-term (current) use of injectable non-insulin antidiabetic drugs: Secondary | ICD-10-CM

## 2023-08-28 DIAGNOSIS — E1169 Type 2 diabetes mellitus with other specified complication: Secondary | ICD-10-CM

## 2023-08-28 MED ORDER — OZEMPIC (1 MG/DOSE) 4 MG/3ML ~~LOC~~ SOPN
1.0000 mg | PEN_INJECTOR | SUBCUTANEOUS | 1 refills | Status: AC
Start: 2023-08-28 — End: ?

## 2023-08-28 NOTE — Progress Notes (Signed)
 08/28/2023 Name: Philip J Bales Jr. MRN: [id number] DOB: 1955-05-26  Chief Complaint  Patient presents with   Medication Management    Philip Riebel. is a 68 y.o. year old male who presented for a telephone visit.   They were referred to the pharmacist by their PCP for assistance in managing diabetes and medication access.    Subjective:   Care Team: Primary Care Provider: Edman Marsa JINNY, DO ; Next Scheduled Visit: 09/05/2023  Medication Access/Adherence  Current Pharmacy:  [facility] - [place], [place] - [address] [place] [address] Phone: [phone] Fax: [phone]  [facility] [address], [place] - [address] [place] [address] Phone: [phone] Fax: [phone]   Patient reports affordability concerns with their medications: No Patient reports access/transportation concerns to their pharmacy: No  Patient reports adherence concerns with their medications:  No     Admits missed doses of Ozempic  and metformin  in early July when went to beach, but taking consistently since     Diabetes:   Current medications:  - metformin  ER 500 mg - 2 tablets (1000 mg) twice daily with meals - Ozempic  1 mg weekly             Reports tolerating well; denies side effects   Medications previously tried: metformin  IR, Xigduo  (cost); Mounjaro  (cost); Ozempic  (cost)     Current home blood sugar readings throughout the day ranging 171-210  Attributes elevated readings to larger portion sizes, particularly of carbohydrates, at supper and snacking at night   Patient denies hypoglycemic s/sx including dizziness, shakiness, sweating.    Admits to drinking Coca cola fountain drink sometimes twice daily.    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 Sig Documenting Provider Last Dose Status Informant  amLODipine  (NORVASC ) 10 MG tablet [id number]  TAKE ONE TABLET BY MOUTH DAILY Edman Marsa JINNY, DO  Active   aspirin EC 81 MG tablet [id number]  Take 81 mg by mouth daily. Swallow whole. [provider]  Active   Blood Glucose Monitoring Suppl (ONETOUCH VERIO) w/Device KIT [id number]  1 Device by Does not apply route 3 (three) times daily. [name], NP  Active   glucose blood [facility] VERIO) test strip [id number]  Check blood sugar 6 x daily [name], NP  Active   metFORMIN  (GLUCOPHAGE -XR) 500 MG 24 hr tablet [id number] Yes Take 2 tablets (1,000 mg total) by mouth 2 (two) times daily with a meal. Edman, Marsa JINNY, DO  Active   Multiple Vitamin (MULTIVITAMIN) tablet [id number]  Take 1 tablet by mouth daily. [provider]  Active   OneTouch Delica Lancets 33G  MISC [id number]  1 Device by Does not apply route 3 (three) times daily as needed. [name], NP  Active   OZEMPIC , 1 MG/DOSE, 4 MG/3ML SOPN [id number] Yes INJECT 1MG  INTO THE SKIN ONCE WEEKLY [name], DO  Active   rosuvastatin  (CRESTOR ) 5 MG tablet [id number]  TAKE 1 TABLET BY MOUTH DAILY [name], DO  Active   telmisartan  (MICARDIS ) 80 MG tablet [id number]  Take 1 tablet (80 mg total) by mouth daily. [name], DO  Active               Assessment/Plan:   Diabetes: - Currently  uncontrolled, but improved based on latest A1C - Reviewed long term cardiovascular and renal outcomes of uncontrolled blood sugar - Reviewed goal A1c, goal fasting, and goal 2 hour post prandial glucose - Reviewed dietary modifications including importance of having regular well-balanced meals and snacks throughout the day, while controlling carbohydrate portion sizes             Discuss eating more non-starchy vegetables in place of potatoes/pasta Encourage patient to reduce/avoid consumption of sugary beverages, drinking water instead             Encourage to review nutrition labels for carbohydrate content of foods - Patient prefers to work on dietary changes to improve blood sugar control - Requests renewal of his Ozempic  1 mg prescription             CPP sends renewal to pharmacy for patient - Recommend to check glucose, keep log of results and have this record to review during upcoming appointments             Encourage patient to use blood sugar readings as feedback on dietary choices     Follow Up Plan: Clinical Pharmacist will follow up with patient by telephone on 11/13/2023 at 9:30 AM     Sharyle Sia, PharmD, JAQUELINE, CPP Clinical Pharmacist [facility] [phone]

## 2023-08-28 NOTE — Patient Instructions (Signed)
 Goals Addressed             This Visit's Progress    Pharmacy Goals       Our goal A1c is less than 7%. This corresponds with fasting sugars less than 130 and 2 hour after meal sugars less than 180. Please keep a log of your results when checking your blood sugar   Our goal bad cholesterol, or LDL, is less than 70 . This is why it is important to take your rosuvastatin daily  Thank you!  Estelle Grumbles, PharmD, [facility] Clinical Pharmacist [facility] [phone]

## 2023-08-31 ENCOUNTER — Ambulatory Visit: Admitting: Family Medicine

## 2023-09-05 ENCOUNTER — Ambulatory Visit: Admitting: Family Medicine

## 2023-09-05 ENCOUNTER — Encounter: Payer: Self-pay | Admitting: Family Medicine

## 2023-09-05 ENCOUNTER — Other Ambulatory Visit: Payer: Self-pay | Admitting: Family Medicine

## 2023-09-05 VITALS — BP 132/80 | HR 114 | Ht 70.0 in | Wt 199.5 lb

## 2023-09-05 DIAGNOSIS — E785 Hyperlipidemia, unspecified: Secondary | ICD-10-CM

## 2023-09-05 DIAGNOSIS — Z7985 Long-term (current) use of injectable non-insulin antidiabetic drugs: Secondary | ICD-10-CM

## 2023-09-05 DIAGNOSIS — E1169 Type 2 diabetes mellitus with other specified complication: Secondary | ICD-10-CM

## 2023-09-05 DIAGNOSIS — R351 Nocturia: Secondary | ICD-10-CM

## 2023-09-05 DIAGNOSIS — I10 Essential (primary) hypertension: Secondary | ICD-10-CM | POA: Diagnosis not present

## 2023-09-05 DIAGNOSIS — Z0000 Encounter for general adult medical examination without abnormal findings: Secondary | ICD-10-CM

## 2023-09-05 DIAGNOSIS — J309 Allergic rhinitis, unspecified: Secondary | ICD-10-CM | POA: Diagnosis not present

## 2023-09-05 LAB — POCT GLYCOSYLATED HEMOGLOBIN (HGB A1C): Hemoglobin A1C: 7.7 % — AB (ref 4.0–5.6)

## 2023-09-05 MED ORDER — AMLODIPINE BESYLATE 10 MG PO TABS
10.0000 mg | ORAL_TABLET | Freq: Every day | ORAL | 3 refills | Status: AC
Start: 2023-09-05 — End: ?

## 2023-09-05 MED ORDER — TRIAMCINOLONE ACETONIDE 40 MG/ML IJ SUSP
40.0000 mg | Freq: Once | INTRAMUSCULAR | Status: AC
  Administered 2023-09-05: 40 mg via INTRAMUSCULAR

## 2023-09-05 NOTE — Patient Instructions (Addendum)
 Thank you for coming to the office today.  Recent Labs    12/22/22 0751 03/30/23 0905 09/05/23 1508  HGBA1C 9.8* 8.9* 7.7*   Keep on Ozempic  1mg  weekly  Continue Metformin  for now, in future we can reduce the dose or discontinue if no longer needed. Consider dose increase up to 2mg  on the Ozempic   DUE for FASTING BLOOD WORK (no food or drink after midnight before the lab appointment, only water or coffee without cream/sugar on the morning of)  SCHEDULE Lab Only visit in the morning at the clinic for lab draw in 4 MONTHS   - Make sure Lab Only appointment is at about 1 week before your next appointment, so that results will be available  For Lab Results, once available within 2-3 days of blood draw, you can can log in to MyChart online to view your results and a brief explanation. Also, we can discuss results at next follow-up visit.   Please schedule a Follow-up Appointment to: Return in about 4 months (around 01/05/2024) for 4 month fasting lab > 1 week later Annual Physical.  If you have any other questions or concerns, please feel free to call the office or send a message through MyChart. You may also schedule an earlier appointment if necessary.  Additionally, you may be receiving a survey about your experience at our office within a few days to 1 week by e-mail or mail. We value your feedback.  Marsa Officer, DO [facility], [place]

## 2023-09-05 NOTE — Progress Notes (Signed)
 Subjective:    Patient ID: Philip JINNY Gerome Mickey., male    DOB: 10-28-1955, 68 y.o.   MRN: [id number]  Philip Vanaman. is a 68 y.o. male presenting on 09/05/2023 for Medical Management of Chronic Issues   HPI  Discussed the use of AI scribe software for clinical note transcription with the patient, who gave verbal consent to proceed.  History of Present Illness   Philip Antigua. is a 68 year old male with diabetes who presents for a follow-up visit and blood sugar check.    CHRONIC HTN: Reports no concern, controlled Current Meds - Amlodipine  10mg  and Telmisartan  80mg    Reports good compliance, took meds today. Tolerating well, w/o complaints. Denies CP, dyspnea, HA, edema, dizziness / lightheadedness  Hyperlipidemi Continue Rosuvastatin  5mg . No refill needed  Allergic rhinitis and steroid injection - Due for triamcinolone  injection, which has been administered previously, particularly before allergy season       CHRONIC DM, Type 2: Improved A1c today to 7.7 from 8.9 Improved lifestyle - reduced soft drinks Now on higher dose Ozempic  Followed by clinical pharmacy Current med - Ozempic  1mg  weekly (on PAP per pharmacy assistance) Metformin  XR 500mg  x 2 = 1000mg  TWICE A DAY [facility] 12/2023 due Denies hypoglycemia, polyuria, visual changes, numbness or tingling.       05/23/2023    8:36 AM 03/30/2023    8:58 AM 12/29/2022    9:38 AM  Depression screen PHQ 2/9  Decreased Interest 0 0 0  Down, Depressed, Hopeless 0 0 0  PHQ - 2 Score 0 0 0  Altered sleeping 0 0   Tired, decreased energy 0 0   Change in appetite 0 0   Feeling bad or failure about yourself  0 0   Trouble concentrating 0 0   Moving slowly or fidgety/restless 0 0   Suicidal thoughts 0 0   PHQ-9 Score 0 0        05/23/2023    8:36 AM 03/30/2023    8:58 AM 12/29/2022    9:38 AM 12/14/2021    2:23 PM  GAD 7 : Generalized Anxiety Score  Nervous, Anxious, on Edge 0 0 0 0   Control/stop worrying 0 0 0 0  Worry too much - different things 0 0 0 0  Trouble relaxing 0 0 0 0  Restless 0 0 0 0  Easily annoyed or irritable 0 0 0 0  Afraid - awful might happen 0 0 0 0  Total GAD 7 Score 0 0 0 0  Anxiety Difficulty    Not difficult at all    Social History   Tobacco Use   Smoking status: Former    Current packs/day: 0.00    Types: Cigarettes    Quit date: 07/28/1974    Years since quitting: 49.1   Smokeless tobacco: Never  Vaping Use   Vaping status: Never Used  Substance Use Topics   Alcohol use: Yes    Alcohol/week: 2.0 standard drinks of alcohol    Types: 2 Cans of beer per week    Comment: bi weekly use per pt.   Drug use: No    Review of Systems Per HPI unless specifically indicated above     Objective:    BP 132/80 (BP Location: Right Arm, Patient Position: Sitting, Cuff Size: Normal)   Pulse (!) 114   Ht 5' 10 (1.778 m)   Wt 199 lb 8 oz (90.5 kg)   SpO2  91%   BMI 28.63 kg/m   Wt Readings from Last 3 Encounters:  09/05/23 199 lb 8 oz (90.5 kg)  05/23/23 200 lb 4 oz (90.8 kg)  03/30/23 196 lb (88.9 kg)    Physical Exam Vitals and nursing note reviewed.  Constitutional:      General: He is not in acute distress.    Appearance: Normal appearance. He is well-developed. He is not diaphoretic.     Comments: Well-appearing, comfortable, cooperative  HENT:     Head: Normocephalic and atraumatic.  Eyes:     General:        Right eye: No discharge.        Left eye: No discharge.     Conjunctiva/sclera: Conjunctivae normal.  Cardiovascular:     Rate and Rhythm: Normal rate.  Pulmonary:     Effort: Pulmonary effort is normal.  Skin:    General: Skin is warm and dry.     Findings: No erythema or rash.  Neurological:     Mental Status: He is alert and oriented to person, place, and time.  Psychiatric:        Mood and Affect: Mood normal.        Behavior: Behavior normal.        Thought Content: Thought content normal.      Comments: Well groomed, good eye contact, normal speech and thoughts     Results for orders placed or performed in visit on 09/05/23  POCT HgB A1C   Collection Time: 09/05/23  3:08 PM  Result Value Ref Range   Hemoglobin A1C 7.7 (A) 4.0 - 5.6 %   HbA1c POC (<> result, manual entry)     HbA1c, POC (prediabetic range)     HbA1c, POC (controlled diabetic range)        Assessment & Plan:   Problem List Items Addressed This Visit     Hyperlipidemia due to type 2 diabetes mellitus (HCC)   Relevant Medications   amLODipine  (NORVASC ) 10 MG tablet   Hypertension   Relevant Medications   amLODipine  (NORVASC ) 10 MG tablet   Type 2 diabetes mellitus with other specified complication (HCC) - Primary   Relevant Orders   POCT HgB A1C (Completed)   Other Visit Diagnoses       Allergic sinusitis       Relevant Medications   triamcinolone  acetonide (KENALOG -40) injection 40 mg (Completed)     Long-term current use of injectable noninsulin antidiabetic medication            Type 2 diabetes mellitus Improved glycemic control with current medication regimen. A1c decreased from 9.8% to 7.7%. Goal A1c is 7% or lower. Potential to discontinue metformin  if A1c improves further. - Continue Ozempic  1 mg weekly. - Continue metformin  with current dosing. - Consider reducing or discontinuing metformin  if A1c improves. - Consider increasing Ozempic  to 2 mg if needed. - Encourage reduction of soft drink intake.  - Encourage balanced eating patterns, especially during supper. - Monitor weight and adjust treatment as needed.  Essential hypertension Blood pressure well-controlled at 132/80 mmHg on amlodipine . - Refill amlodipine  prescription through [facility].  Hyperlipidemia Continue current therapy Rosuvastatin  5mg  Cholesterol medication supply adequate until December. - Monitor medication supply and request refill if needed in December.  Allergic rhinitis Managed with  triamcinolone  injection, effective before allergy season. - Administer 40 mg triamcinolone  injection.        Orders Placed This Encounter  Procedures   POCT HgB A1C  Meds ordered this encounter  Medications   triamcinolone  acetonide (KENALOG -40) injection 40 mg   amLODipine  (NORVASC ) 10 MG tablet    Sig: Take 1 tablet (10 mg total) by mouth daily.    Dispense:  90 tablet    Refill:  3    Add refills 1 year, [facility] Patient    Follow up plan: Return in about 4 months (around 01/05/2024) for 4 month fasting lab > 1 week later Annual Physical.  Future labs ordered for 12/2023 urine micro and all labs  Marsa Officer, DO [facility] 09/05/2023, 3:13 PM

## 2023-09-27 DIAGNOSIS — M9903 Segmental and somatic dysfunction of lumbar region: Secondary | ICD-10-CM | POA: Diagnosis not present

## 2023-09-27 DIAGNOSIS — M9905 Segmental and somatic dysfunction of pelvic region: Secondary | ICD-10-CM | POA: Diagnosis not present

## 2023-09-27 DIAGNOSIS — M545 Low back pain, unspecified: Secondary | ICD-10-CM | POA: Diagnosis not present

## 2023-09-27 DIAGNOSIS — M9904 Segmental and somatic dysfunction of sacral region: Secondary | ICD-10-CM | POA: Diagnosis not present

## 2023-10-05 ENCOUNTER — Other Ambulatory Visit: Payer: Self-pay | Admitting: Family Medicine

## 2023-10-05 DIAGNOSIS — E1169 Type 2 diabetes mellitus with other specified complication: Secondary | ICD-10-CM

## 2023-10-06 NOTE — Telephone Encounter (Signed)
 Requested Prescriptions  Pending Prescriptions Disp Refills   metFORMIN  (GLUCOPHAGE -XR) 500 MG 24 hr tablet [Pharmacy Med Name: METFORMIN  HCL ER 500 MG TAB] 360 tablet 0    Sig: TAKE TWO TABLETS BY MOUTH TWICE DAILY WITH A MEAL     Endocrinology:  Diabetes - Biguanides Failed - 10/06/2023 12:31 PM      Failed - B12 Level in normal range and within 720 days    No results found for: VITAMINB12       Passed - Cr in normal range and within 360 days    Creat  Date Value Ref Range Status  12/22/2022 0.72 0.70 - 1.35 mg/dL Final   Creatinine, Urine  Date Value Ref Range Status  01/03/2023 57 20 - 320 mg/dL Final         Passed - HBA1C is between 0 and 7.9 and within 180 days    Hemoglobin A1C  Date Value Ref Range Status  09/05/2023 7.7 (A) 4.0 - 5.6 % Final  09/08/2015 7.8  Final   HB A1C (BAYER DCA - WAIVED)  Date Value Ref Range Status  10/09/2019 8.7 (H) <7.0 % Final    Comment:                                          Diabetic Adult            <7.0                                       Healthy Adult        4.3 - 5.7                                                           (DCCT/NGSP) American Diabetes Association's Summary of Glycemic Recommendations for Adults with Diabetes: Hemoglobin A1c <7.0%. More stringent glycemic goals (A1c <6.0%) may further reduce complications at the cost of increased risk of hypoglycemia.    Hgb A1c MFr Bld  Date Value Ref Range Status  12/22/2022 9.8 (H) <5.7 % of total Hgb Final    Comment:    For someone without known diabetes, a hemoglobin A1c value of 6.5% or greater indicates that they may have  diabetes and this should be confirmed with a follow-up  test. . For someone with known diabetes, a value <7% indicates  that their diabetes is well controlled and a value  greater than or equal to 7% indicates suboptimal  control. A1c targets should be individualized based on  duration of diabetes, age, comorbid conditions, and  other  considerations. . Currently, no consensus exists regarding use of hemoglobin A1c for diagnosis of diabetes for children. .          Passed - eGFR in normal range and within 360 days    GFR calc Af Amer  Date Value Ref Range Status  10/09/2019 114 >59 mL/min/1.73 Final    Comment:    **[facility] currently reports eGFR in compliance with the current**   recommendations of the [employer]. [facility] will   update reporting as new guidelines are published from the  NKF-ASN   Task force.    GFR, Estimated  Date Value Ref Range Status  05/25/2022 >60 >60 mL/min Final    Comment:    (NOTE) Calculated using the CKD-EPI Creatinine Equation (2021)    eGFR  Date Value Ref Range Status  12/22/2022 101 > OR = 60 mL/min/1.32m2 Final         Passed - Valid encounter within last 6 months    Recent Outpatient Visits           1 month ago Type 2 diabetes mellitus with other specified complication, without long-term current use of insulin (HCC)   [facility], Marsa PARAS, DO   4 months ago Non-healing skin lesion   [facility] [place], Marsa PARAS, DO   6 months ago Type 2 diabetes mellitus with other specified complication, without long-term current use of insulin (HCC)   [facility] [place], Marsa PARAS, DO              Passed - CBC within normal limits and completed in the last 12 months    WBC  Date Value Ref Range Status  12/22/2022 4.9 3.8 - 10.8 Thousand/uL Final   RBC  Date Value Ref Range Status  12/22/2022 5.47 4.20 - 5.80 Million/uL Final   Hemoglobin  Date Value Ref Range Status  12/22/2022 16.2 13.2 - 17.1 g/dL Final  90/77/7978 83.5 13.0 - 17.7 g/dL Final   HCT  Date Value Ref Range Status  12/22/2022 48.8 38.5 - 50.0 % Final   Hematocrit  Date Value Ref Range Status  10/09/2019 49.7 37.5 - 51.0 % Final   MCHC  Date Value Ref Range Status   12/22/2022 33.2 32.0 - 36.0 g/dL Final    Comment:    For adults, a slight decrease in the calculated MCHC value (in the range of 30 to 32 g/dL) is most likely not clinically significant; however, it should be interpreted with caution in correlation with other red cell parameters and the patient's clinical condition.    [facility]  Date Value Ref Range Status  12/22/2022 29.6 27.0 - 33.0 pg Final   MCV  Date Value Ref Range Status  12/22/2022 89.2 80.0 - 100.0 fL Final  10/09/2019 90 79 - 97 fL Final   No results found for: PLTCOUNTKUC, LABPLAT, POCPLA RDW  Date Value Ref Range Status  12/22/2022 11.8 11.0 - 15.0 % Final  10/09/2019 13.0 11.6 - 15.4 % Final

## 2023-10-18 DIAGNOSIS — M545 Low back pain, unspecified: Secondary | ICD-10-CM | POA: Diagnosis not present

## 2023-10-18 DIAGNOSIS — M9905 Segmental and somatic dysfunction of pelvic region: Secondary | ICD-10-CM | POA: Diagnosis not present

## 2023-10-18 DIAGNOSIS — M9904 Segmental and somatic dysfunction of sacral region: Secondary | ICD-10-CM | POA: Diagnosis not present

## 2023-10-18 DIAGNOSIS — M9903 Segmental and somatic dysfunction of lumbar region: Secondary | ICD-10-CM | POA: Diagnosis not present

## 2023-11-02 ENCOUNTER — Other Ambulatory Visit: Payer: Self-pay | Admitting: Family Medicine

## 2023-11-02 DIAGNOSIS — E1169 Type 2 diabetes mellitus with other specified complication: Secondary | ICD-10-CM

## 2023-11-03 ENCOUNTER — Other Ambulatory Visit: Payer: Self-pay | Admitting: Family Medicine

## 2023-11-03 DIAGNOSIS — E1169 Type 2 diabetes mellitus with other specified complication: Secondary | ICD-10-CM

## 2023-11-03 NOTE — Telephone Encounter (Signed)
 Requested Prescriptions  Pending Prescriptions Disp Refills   Semaglutide , 1 MG/DOSE, (OZEMPIC , 1 MG/DOSE,) 4 MG/3ML SOPN [Pharmacy Med Name: Ozempic  (1 MG/DOSE) 4 MG/3ML Subcutaneous Solution Pen-injector] 3 mL 2    Sig: INJECT 1MG  INTO THE SKIN ONCE WEEKLY     Endocrinology:  Diabetes - GLP-1 Receptor Agonists - semaglutide  Failed - 11/03/2023  3:01 PM      Failed - HBA1C in normal range and within 180 days    Hemoglobin A1C  Date Value Ref Range Status  09/05/2023 7.7 (A) 4.0 - 5.6 % Final  09/08/2015 7.8  Final   HB A1C (BAYER DCA - WAIVED)  Date Value Ref Range Status  10/09/2019 8.7 (H) <7.0 % Final    Comment:                                          Diabetic Adult            <7.0                                       Healthy Adult        4.3 - 5.7                                                           (DCCT/NGSP) American Diabetes Association's Summary of Glycemic Recommendations for Adults with Diabetes: Hemoglobin A1c <7.0%. More stringent glycemic goals (A1c <6.0%) may further reduce complications at the cost of increased risk of hypoglycemia.    Hgb A1c MFr Bld  Date Value Ref Range Status  12/22/2022 9.8 (H) <5.7 % of total Hgb Final    Comment:    For someone without known diabetes, a hemoglobin A1c value of 6.5% or greater indicates that they may have  diabetes and this should be confirmed with a follow-up  test. . For someone with known diabetes, a value <7% indicates  that their diabetes is well controlled and a value  greater than or equal to 7% indicates suboptimal  control. A1c targets should be individualized based on  duration of diabetes, age, comorbid conditions, and  other considerations. . Currently, no consensus exists regarding use of hemoglobin A1c for diagnosis of diabetes for children. .          Passed - Cr in normal range and within 360 days    Creat  Date Value Ref Range Status  12/22/2022 0.72 0.70 - 1.35 mg/dL Final    Creatinine, Urine  Date Value Ref Range Status  01/03/2023 57 20 - 320 mg/dL Final         Passed - Valid encounter within last 6 months    Recent Outpatient Visits           1 month ago Type 2 diabetes mellitus with other specified complication, without long-term current use of insulin [facility])   [facility] [place], [place] J, DO   5 months ago Non-healing skin lesion   [facility] [name], DO   7 months ago Type 2 diabetes mellitus with other specified complication,  without long-term current use of insulin [facility])   [facility] [place], Marsa PARAS, [place]

## 2023-11-06 NOTE — Telephone Encounter (Signed)
 Requested Prescriptions  Pending Prescriptions Disp Refills   rosuvastatin  (CRESTOR ) 5 MG tablet [Pharmacy Med Name: ROSUVASTATIN  CALCIUM  5 MG TAB] 90 tablet 1    Sig: TAKE 1 TABLET BY MOUTH DAILY     Cardiovascular:  Antilipid - Statins 2 Failed - 11/06/2023  3:25 PM      Failed - Lipid Panel in normal range within the last 12 months    Cholesterol, Total  Date Value Ref Range Status  10/09/2019 239 (H) 100 - 199 mg/dL Final   Cholesterol  Date Value Ref Range Status  12/22/2022 214 (H) <200 mg/dL Final   Cholesterol Piccolo, Waived  Date Value Ref Range Status  02/05/2018 218 (H) <200 mg/dL Final    Comment:                            Desirable                <200                         Borderline High      200- 239                         High                     >239    LDL Cholesterol (Calc)  Date Value Ref Range Status  12/22/2022 134 (H) mg/dL (calc) Final    Comment:    Reference range: <100 . Desirable range <100 mg/dL for primary prevention;   <70 mg/dL for patients with CHD or diabetic patients  with > or = 2 CHD risk factors. SABRA LDL-C is now calculated using the Martin-Hopkins  calculation, which is a validated novel method providing  better accuracy than the Friedewald equation in the  estimation of LDL-C.  Philip Ryan et al. SANDREA. 7986;689(80): 2061-2068  (http://education.[facility].com/faq/FAQ164)    HDL  Date Value Ref Range Status  12/22/2022 55 > OR = 40 mg/dL Final  90/77/7978 52 >60 mg/dL Final   Triglycerides  Date Value Ref Range Status  12/22/2022 141 <150 mg/dL Final   Triglycerides Piccolo,Waived  Date Value Ref Range Status  02/05/2018 150 (H) <150 mg/dL Final    Comment:                            Normal                   <150                         Borderline High     150 - 199                         High                200 - 499                         Very High                >499          Passed - Cr in normal range  and within 360 days    Creat  Date Value Ref  Range Status  12/22/2022 0.72 0.70 - 1.35 mg/dL Final   Creatinine, Urine  Date Value Ref Range Status  01/03/2023 57 20 - 320 mg/dL Final         Passed - Patient is not pregnant      Passed - Valid encounter within last 12 months    Recent Outpatient Visits           2 months ago Type 2 diabetes mellitus with other specified complication, without long-term current use of insulin (HCC)   [facility] [place], Marsa PARAS, DO   5 months ago Non-healing skin lesion   [place] [facility] [place], Marsa PARAS, DO   7 months ago Type 2 diabetes mellitus with other specified complication, without long-term current use of insulin (HCC)    [facility] [place], Marsa PARAS, DO              Refused Prescriptions Disp Refills   metFORMIN  (GLUCOPHAGE -XR) 500 MG 24 hr tablet [Pharmacy Med Name: METFORMIN  HCL ER 500 MG TAB] 360 tablet 0    Sig: TAKE TWO TABLETS BY MOUTH TWICE DAILY WITH A MEAL     Endocrinology:  Diabetes - Biguanides Failed - 11/06/2023  3:25 PM      Failed - B12 Level in normal range and within 720 days    No results found for: VITAMINB12       Passed - Cr in normal range and within 360 days    Creat  Date Value Ref Range Status  12/22/2022 0.72 0.70 - 1.35 mg/dL Final   Creatinine, Urine  Date Value Ref Range Status  01/03/2023 57 20 - 320 mg/dL Final         Passed - HBA1C is between 0 and 7.9 and within 180 days    Hemoglobin A1C  Date Value Ref Range Status  09/05/2023 7.7 (A) 4.0 - 5.6 % Final  09/08/2015 7.8  Final   HB A1C (BAYER DCA - WAIVED)  Date Value Ref Range Status  10/09/2019 8.7 (H) <7.0 % Final    Comment:                                          Diabetic Adult            <7.0                                       Healthy Adult        4.3 - 5.7                                                            (DCCT/NGSP) American Diabetes Association's Summary of Glycemic Recommendations for Adults with Diabetes: Hemoglobin A1c <7.0%. More stringent glycemic goals (A1c <6.0%) may further reduce complications at the cost of increased risk of hypoglycemia.    Hgb A1c MFr Bld  Date Value Ref Range Status  12/22/2022 9.8 (H) <5.7 % of total Hgb Final    Comment:    For someone without known diabetes, a hemoglobin  A1c value of 6.5% or greater indicates that they may have  diabetes and this should be confirmed with a follow-up  test. . For someone with known diabetes, a value <7% indicates  that their diabetes is well controlled and a value  greater than or equal to 7% indicates suboptimal  control. A1c targets should be individualized based on  duration of diabetes, age, comorbid conditions, and  other considerations. . Currently, no consensus exists regarding use of hemoglobin A1c for diagnosis of diabetes for children. .          Passed - eGFR in normal range and within 360 days    GFR calc Af Amer  Date Value Ref Range Status  10/09/2019 114 >59 mL/min/1.73 Final    Comment:    **[facility] currently reports eGFR in compliance with the current**   recommendations of the [employer]. [facility] will   update reporting as new guidelines are published from the NKF-ASN   Task force.    GFR, Estimated  Date Value Ref Range Status  05/25/2022 >60 >60 mL/min Final    Comment:    (NOTE) Calculated using the CKD-EPI Creatinine Equation (2021)    eGFR  Date Value Ref Range Status  12/22/2022 101 > OR = 60 mL/min/1.80m2 Final         Passed - Valid encounter within last 6 months    Recent Outpatient Visits           2 months ago Type 2 diabetes mellitus with other specified complication, without long-term current use of insulin (HCC)   [place] [facility] [place], Marsa PARAS, DO   5 months ago Non-healing skin lesion   [facility] [place], Marsa PARAS, DO   7 months ago Type 2 diabetes mellitus with other specified complication, without long-term current use of insulin (HCC)   [facility] [place], Marsa PARAS, DO              Passed - CBC within normal limits and completed in the last 12 months    WBC  Date Value Ref Range Status  12/22/2022 4.9 3.8 - 10.8 Thousand/uL Final   RBC  Date Value Ref Range Status  12/22/2022 5.47 4.20 - 5.80 Million/uL Final   Hemoglobin  Date Value Ref Range Status  12/22/2022 16.2 13.2 - 17.1 g/dL Final  90/77/7978 83.5 13.0 - 17.7 g/dL Final   HCT  Date Value Ref Range Status  12/22/2022 48.8 38.5 - 50.0 % Final   Hematocrit  Date Value Ref Range Status  10/09/2019 49.7 37.5 - 51.0 % Final   MCHC  Date Value Ref Range Status  12/22/2022 33.2 32.0 - 36.0 g/dL Final    Comment:    For adults, a slight decrease in the calculated MCHC value (in the range of 30 to 32 g/dL) is most likely not clinically significant; however, it should be interpreted with caution in correlation with other red cell parameters and the patient's clinical condition.    [facility]  Date Value Ref Range Status  12/22/2022 29.6 27.0 - 33.0 pg Final   MCV  Date Value Ref Range Status  12/22/2022 89.2 80.0 - 100.0 fL Final  10/09/2019 90 79 - 97 fL Final   No results found for: PLTCOUNTKUC, LABPLAT, POCPLA RDW  Date Value Ref Range Status  12/22/2022 11.8 11.0 - 15.0 % Final  10/09/2019 13.0 11.6 - 15.4 % Final

## 2023-11-07 ENCOUNTER — Other Ambulatory Visit: Payer: Self-pay | Admitting: Family Medicine

## 2023-11-07 DIAGNOSIS — E1169 Type 2 diabetes mellitus with other specified complication: Secondary | ICD-10-CM

## 2023-11-09 NOTE — Telephone Encounter (Signed)
 Requested Prescriptions  Pending Prescriptions Disp Refills   metFORMIN  (GLUCOPHAGE -XR) 500 MG 24 hr tablet [Pharmacy Med Name: METFORMIN  HCL ER 500 MG TAB] 360 tablet 0    Sig: TAKE TWO TABLETS BY MOUTH TWICE DAILY WITH A MEAL     Endocrinology:  Diabetes - Biguanides Failed - 11/09/2023  1:56 PM      Failed - B12 Level in normal range and within 720 days    No results found for: VITAMINB12       Passed - Cr in normal range and within 360 days    Creat  Date Value Ref Range Status  12/22/2022 0.72 0.70 - 1.35 mg/dL Final   Creatinine, Urine  Date Value Ref Range Status  01/03/2023 57 20 - 320 mg/dL Final         Passed - HBA1C is between 0 and 7.9 and within 180 days    Hemoglobin A1C  Date Value Ref Range Status  09/05/2023 7.7 (A) 4.0 - 5.6 % Final  09/08/2015 7.8  Final   HB A1C (BAYER DCA - WAIVED)  Date Value Ref Range Status  10/09/2019 8.7 (H) <7.0 % Final    Comment:                                          Diabetic Adult            <7.0                                       Healthy Adult        4.3 - 5.7                                                           (DCCT/NGSP) American Diabetes Association's Summary of Glycemic Recommendations for Adults with Diabetes: Hemoglobin A1c <7.0%. More stringent glycemic goals (A1c <6.0%) may further reduce complications at the cost of increased risk of hypoglycemia.    Hgb A1c MFr Bld  Date Value Ref Range Status  12/22/2022 9.8 (H) <5.7 % of total Hgb Final    Comment:    For someone without known diabetes, a hemoglobin A1c value of 6.5% or greater indicates that they may have  diabetes and this should be confirmed with a follow-up  test. . For someone with known diabetes, a value <7% indicates  that their diabetes is well controlled and a value  greater than or equal to 7% indicates suboptimal  control. A1c targets should be individualized based on  duration of diabetes, age, comorbid conditions, and  other  considerations. . Currently, no consensus exists regarding use of hemoglobin A1c for diagnosis of diabetes for children. .          Passed - eGFR in normal range and within 360 days    GFR calc Af Amer  Date Value Ref Range Status  10/09/2019 114 >59 mL/min/1.73 Final    Comment:    **[facility] currently reports eGFR in compliance with the current**   recommendations of the [employer]. [facility] will   update reporting as new guidelines are published from  the NKF-ASN   Task force.    GFR, Estimated  Date Value Ref Range Status  05/25/2022 >60 >60 mL/min Final    Comment:    (NOTE) Calculated using the CKD-EPI Creatinine Equation (2021)    eGFR  Date Value Ref Range Status  12/22/2022 101 > OR = 60 mL/min/1.35m2 Final         Passed - Valid encounter within last 6 months    Recent Outpatient Visits           2 months ago Type 2 diabetes mellitus with other specified complication, without long-term current use of insulin (HCC)   [place] [facility] [place], Marsa PARAS, DO   5 months ago Non-healing skin lesion   [place] [place], Marsa PARAS, DO   7 months ago Type 2 diabetes mellitus with other specified complication, without long-term current use of insulin (HCC)   [place] [facility] [place], Marsa PARAS, DO              Passed - CBC within normal limits and completed in the last 12 months    WBC  Date Value Ref Range Status  12/22/2022 4.9 3.8 - 10.8 Thousand/uL Final   RBC  Date Value Ref Range Status  12/22/2022 5.47 4.20 - 5.80 Million/uL Final   Hemoglobin  Date Value Ref Range Status  12/22/2022 16.2 13.2 - 17.1 g/dL Final  90/77/7978 83.5 13.0 - 17.7 g/dL Final   HCT  Date Value Ref Range Status  12/22/2022 48.8 38.5 - 50.0 % Final   Hematocrit  Date Value Ref Range Status  10/09/2019 49.7 37.5 - 51.0 % Final   MCHC  Date Value Ref Range Status   12/22/2022 33.2 32.0 - 36.0 g/dL Final    Comment:    For adults, a slight decrease in the calculated MCHC value (in the range of 30 to 32 g/dL) is most likely not clinically significant; however, it should be interpreted with caution in correlation with other red cell parameters and the patient's clinical condition.    [facility]  Date Value Ref Range Status  12/22/2022 29.6 27.0 - 33.0 pg Final   MCV  Date Value Ref Range Status  12/22/2022 89.2 80.0 - 100.0 fL Final  10/09/2019 90 79 - 97 fL Final   No results found for: PLTCOUNTKUC, LABPLAT, POCPLA RDW  Date Value Ref Range Status  12/22/2022 11.8 11.0 - 15.0 % Final  10/09/2019 13.0 11.6 - 15.4 % Final

## 2023-11-13 ENCOUNTER — Other Ambulatory Visit: Admitting: Pharmacist

## 2023-11-13 DIAGNOSIS — Z7985 Long-term (current) use of injectable non-insulin antidiabetic drugs: Secondary | ICD-10-CM

## 2023-11-13 DIAGNOSIS — E1169 Type 2 diabetes mellitus with other specified complication: Secondary | ICD-10-CM

## 2023-11-13 NOTE — Patient Instructions (Signed)
 Goals Addressed             This Visit's Progress    Pharmacy Goals       Our goal A1c is less than 7%. This corresponds with fasting sugars less than 130 and 2 hour after meal sugars less than 180. Please keep a log of your results when checking your blood sugar   Our goal bad cholesterol, or LDL, is less than 70 . This is why it is important to take your rosuvastatin daily  Thank you!  Estelle Grumbles, PharmD, [facility] Clinical Pharmacist [facility] [phone]

## 2023-11-13 NOTE — Progress Notes (Signed)
 11/13/2023 Name: Philip Ryan. MRN: [id number] DOB: [birth year]  Chief Complaint  Patient presents with   Medication Management    Philip Ryan. is a 68 y.o. year old male who presented for a telephone visit.   They were referred to the pharmacist by their PCP for assistance in managing diabetes and medication access.    Subjective:   Care Team: Primary Care Provider: Edman Marsa JINNY, DO ; Next Scheduled Visit: 01/17/2024  Medication Access/Adherence  Current Pharmacy:  [facility] - [place], [place] - [address] [place] [address] Phone: [phone] Fax: [phone]  [facility] [address] [place] - [address] [place] [address] Phone: [phone] Fax: [phone]   Patient reports affordability concerns with their medications: No Patient reports access/transportation concerns to their pharmacy: No  Patient reports adherence concerns with their medications:  No        Diabetes:   Current medications:  - metformin  ER 500 mg - 2 tablets (1000 mg) twice daily with meals - Ozempic  1 mg weekly             Reports tolerating well; denies side effects   Medications previously tried: metformin  IR, Xigduo  (cost); Mounjaro  (cost); Ozempic  (cost)     Denies checking morning fasting blood sugar readings recently. Reports has checked a couple of times non-fasting, but does not recall these readings today   Reports has been maintaining positive dietary changes, including reduced intake of sugary beverages   Patient denies hypoglycemic s/sx including dizziness, shakiness, sweating.      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       Objective:  Lab Results  Component Value Date   HGBA1C 7.7 (A) 09/05/2023    Lab Results  Component Value Date   CREATININE 0.72 12/22/2022   BUN 14 12/22/2022    NA 139 12/22/2022   K 4.6 12/22/2022   CL 102 12/22/2022   CO2 27 12/22/2022    Lab Results  Component Value Date   CHOL 214 (H) 12/22/2022   HDL 55 12/22/2022   LDLCALC 134 (H) 12/22/2022   TRIG 141 12/22/2022   CHOLHDL 3.9 12/22/2022   BP Readings from Last 3 Encounters:  09/05/23 132/80  05/23/23 130/78  03/30/23 120/70   Pulse Readings from Last 3 Encounters:  09/05/23 (!) 114  05/23/23 100  03/30/23 96     Medications Reviewed Today     Reviewed by Alana Sharyle LABOR, RPH-CPP (Pharmacist) on 11/13/23 at [phone]  Med List Status: <None>   Medication Order Taking? Sig Documenting Provider Last Dose Status Informant  amLODipine  (NORVASC ) 10 MG tablet [id number]  Take 1 tablet (10 mg total) by mouth daily. Edman Marsa JINNY, DO  Active   aspirin EC 81 MG tablet [id number]  Take 81 mg by mouth daily. Swallow whole. [provider]  Active   Blood Glucose Monitoring Suppl (ONETOUCH VERIO) w/Device KIT [id number]  1 Device by Does not apply route 3 (three) times daily. [name], NP  Active   glucose blood [facility] VERIO) test strip [id number]  Check blood sugar 6 x daily [name], NP  Active   metFORMIN  (GLUCOPHAGE -XR) 500 MG 24 hr tablet [id number] Yes TAKE TWO TABLETS BY MOUTH TWICE DAILY WITH A MEAL Karamalegos, Marsa JINNY, DO  Active   Multiple Vitamin (MULTIVITAMIN) tablet [id number]  Take  1 tablet by mouth daily. [provider]  Active   OneTouch Delica Lancets 33G MISC [id number]  1 Device by Does not apply route 3 (three) times daily as needed. [name], NP  Active   rosuvastatin  (CRESTOR ) 5 MG tablet [id number]  TAKE 1 TABLET BY MOUTH DAILY Karamalegos, Marsa PARAS, DO  Active   Semaglutide , 1 MG/DOSE, (OZEMPIC , 1 MG/DOSE,) 4 MG/3ML SOPN [id number] Yes INJECT 1MG  INTO THE SKIN ONCE WEEKLY [name], DO  Active   telmisartan  (MICARDIS ) 80 MG tablet [id number]  Take 1 tablet (80 mg total) by mouth daily. [name], DO  Active               Assessment/Plan:   Counsel patient that Medicare's Annual Enrollment Period for 2026 started 11/01/2023. Encourage patient to review deductibles formulary coverage and copay amounts (including for Ozempic ) between plans. Also share with patient the contact information for the [facility] Information Program [facility]) that can help with reviewing these Medicare plans  Diabetes: - Currently uncontrolled, but improved based on latest A1C - Reviewed long term cardiovascular and renal outcomes of uncontrolled blood sugar - Reviewed goal A1c, goal fasting, and goal 2 hour post prandial glucose - Reviewed dietary modifications including importance of having regular well-balanced meals and snacks throughout the day, while controlling carbohydrate portion sizes Encourage patient to reduce/avoid consumption of sugary beverages, drinking water instead - Recommend to check glucose, keep log of results and have this record to review during upcoming appointments             Encourage patient to use blood sugar readings as feedback on dietary choices     Follow Up Plan: Clinical Pharmacist will follow up with patient by telephone on 03/11/2024 at 8:30 AM      Sharyle Sia, PharmD, JAQUELINE, CPP Clinical Pharmacist [facility] [phone]

## 2023-11-22 DIAGNOSIS — M545 Low back pain, unspecified: Secondary | ICD-10-CM | POA: Diagnosis not present

## 2023-11-22 DIAGNOSIS — M9903 Segmental and somatic dysfunction of lumbar region: Secondary | ICD-10-CM | POA: Diagnosis not present

## 2023-11-22 DIAGNOSIS — M9904 Segmental and somatic dysfunction of sacral region: Secondary | ICD-10-CM | POA: Diagnosis not present

## 2023-11-22 DIAGNOSIS — M9905 Segmental and somatic dysfunction of pelvic region: Secondary | ICD-10-CM | POA: Diagnosis not present

## 2023-12-01 NOTE — Progress Notes (Signed)
 Philip J Schoenfeld Jr.                                          MRN: [id number]   12/01/2023   The [facility]I Quality Team Specialist reviewed this patient medical record for the purposes of chart review for care gap closure. The following were reviewed: chart review for care gap closure-kidney health evaluation for diabetes:eGFR  and uACR.    VBCI Quality Team

## 2023-12-05 ENCOUNTER — Other Ambulatory Visit: Payer: Self-pay | Admitting: Family Medicine

## 2023-12-05 DIAGNOSIS — E1169 Type 2 diabetes mellitus with other specified complication: Secondary | ICD-10-CM

## 2023-12-08 NOTE — Telephone Encounter (Signed)
 Requested Prescriptions  Pending Prescriptions Disp Refills   metFORMIN  (GLUCOPHAGE -XR) 500 MG 24 hr tablet [Pharmacy Med Name: METFORMIN  HCL ER 500 MG TAB] 360 tablet 0    Sig: TAKE TWO TABLETS BY MOUTH TWICE DAILY WITH A MEAL     Endocrinology:  Diabetes - Biguanides Failed - 12/08/2023 11:29 AM      Failed - B12 Level in normal range and within 720 days    No results found for: VITAMINB12       Passed - Cr in normal range and within 360 days    Creat  Date Value Ref Range Status  12/22/2022 0.72 0.70 - 1.35 mg/dL Final   Creatinine, Urine  Date Value Ref Range Status  01/03/2023 57 20 - 320 mg/dL Final         Passed - HBA1C is between 0 and 7.9 and within 180 days    Hemoglobin A1C  Date Value Ref Range Status  09/05/2023 7.7 (A) 4.0 - 5.6 % Final  09/08/2015 7.8  Final   HB A1C (BAYER DCA - WAIVED)  Date Value Ref Range Status  10/09/2019 8.7 (H) <7.0 % Final    Comment:                                          Diabetic Adult            <7.0                                       Healthy Adult        4.3 - 5.7                                                           (DCCT/NGSP) American Diabetes Association's Summary of Glycemic Recommendations for Adults with Diabetes: Hemoglobin A1c <7.0%. More stringent glycemic goals (A1c <6.0%) may further reduce complications at the cost of increased risk of hypoglycemia.    Hgb A1c MFr Bld  Date Value Ref Range Status  12/22/2022 9.8 (H) <5.7 % of total Hgb Final    Comment:    For someone without known diabetes, a hemoglobin A1c value of 6.5% or greater indicates that they may have  diabetes and this should be confirmed with a follow-up  test. . For someone with known diabetes, a value <7% indicates  that their diabetes is well controlled and a value  greater than or equal to 7% indicates suboptimal  control. A1c targets should be individualized based on  duration of diabetes, age, comorbid conditions, and  other  considerations. . Currently, no consensus exists regarding use of hemoglobin A1c for diagnosis of diabetes for children. .          Passed - eGFR in normal range and within 360 days    GFR calc Af Amer  Date Value Ref Range Status  10/09/2019 114 >59 mL/min/1.73 Final    Comment:    **[facility] currently reports eGFR in compliance with the current**   recommendations of the [employer]. [facility] will   update reporting as new guidelines are published from the  NKF-ASN   Task force.    GFR, Estimated  Date Value Ref Range Status  05/25/2022 >60 >60 mL/min Final    Comment:    (NOTE) Calculated using the CKD-EPI Creatinine Equation (2021)    eGFR  Date Value Ref Range Status  12/22/2022 101 > OR = 60 mL/min/1.79m2 Final         Passed - Valid encounter within last 6 months    Recent Outpatient Visits           3 months ago Type 2 diabetes mellitus with other specified complication, without long-term current use of insulin (HCC)   [place] [facility] [place], Marsa PARAS, DO   6 months ago Non-healing skin lesion   [facility] [place], Marsa PARAS, DO   8 months ago Type 2 diabetes mellitus with other specified complication, without long-term current use of insulin (HCC)    [facility] [place], Marsa PARAS, DO              Passed - CBC within normal limits and completed in the last 12 months    WBC  Date Value Ref Range Status  12/22/2022 4.9 3.8 - 10.8 Thousand/uL Final   RBC  Date Value Ref Range Status  12/22/2022 5.47 4.20 - 5.80 Million/uL Final   Hemoglobin  Date Value Ref Range Status  12/22/2022 16.2 13.2 - 17.1 g/dL Final  90/77/7978 83.5 13.0 - 17.7 g/dL Final   HCT  Date Value Ref Range Status  12/22/2022 48.8 38.5 - 50.0 % Final   Hematocrit  Date Value Ref Range Status  10/09/2019 49.7 37.5 - 51.0 % Final   MCHC  Date Value Ref Range Status   12/22/2022 33.2 32.0 - 36.0 g/dL Final    Comment:    For adults, a slight decrease in the calculated MCHC value (in the range of 30 to 32 g/dL) is most likely not clinically significant; however, it should be interpreted with caution in correlation with other red cell parameters and the patient's clinical condition.    [facility]  Date Value Ref Range Status  12/22/2022 29.6 27.0 - 33.0 pg Final   MCV  Date Value Ref Range Status  12/22/2022 89.2 80.0 - 100.0 fL Final  10/09/2019 90 79 - 97 fL Final   No results found for: PLTCOUNTKUC, LABPLAT, POCPLA RDW  Date Value Ref Range Status  12/22/2022 11.8 11.0 - 15.0 % Final  10/09/2019 13.0 11.6 - 15.4 % Final

## 2023-12-20 DIAGNOSIS — M9903 Segmental and somatic dysfunction of lumbar region: Secondary | ICD-10-CM | POA: Diagnosis not present

## 2023-12-20 DIAGNOSIS — M545 Low back pain, unspecified: Secondary | ICD-10-CM | POA: Diagnosis not present

## 2023-12-20 DIAGNOSIS — M9904 Segmental and somatic dysfunction of sacral region: Secondary | ICD-10-CM | POA: Diagnosis not present

## 2023-12-20 DIAGNOSIS — M9905 Segmental and somatic dysfunction of pelvic region: Secondary | ICD-10-CM | POA: Diagnosis not present

## 2023-12-26 DIAGNOSIS — Z83511 Family history of glaucoma: Secondary | ICD-10-CM | POA: Diagnosis not present

## 2023-12-26 DIAGNOSIS — H11052 Peripheral pterygium, progressive, left eye: Secondary | ICD-10-CM | POA: Diagnosis not present

## 2023-12-26 DIAGNOSIS — H52223 Regular astigmatism, bilateral: Secondary | ICD-10-CM | POA: Diagnosis not present

## 2023-12-26 DIAGNOSIS — H40023 Open angle with borderline findings, high risk, bilateral: Secondary | ICD-10-CM | POA: Diagnosis not present

## 2023-12-26 DIAGNOSIS — H35412 Lattice degeneration of retina, left eye: Secondary | ICD-10-CM | POA: Diagnosis not present

## 2023-12-26 DIAGNOSIS — E119 Type 2 diabetes mellitus without complications: Secondary | ICD-10-CM | POA: Diagnosis not present

## 2023-12-26 DIAGNOSIS — H524 Presbyopia: Secondary | ICD-10-CM | POA: Diagnosis not present

## 2023-12-26 LAB — OPHTHALMOLOGY REPORT-SCANNED

## 2023-12-27 ENCOUNTER — Other Ambulatory Visit (HOSPITAL_COMMUNITY): Payer: Self-pay

## 2024-01-09 ENCOUNTER — Other Ambulatory Visit

## 2024-01-09 DIAGNOSIS — I10 Essential (primary) hypertension: Secondary | ICD-10-CM

## 2024-01-09 DIAGNOSIS — E1169 Type 2 diabetes mellitus with other specified complication: Secondary | ICD-10-CM

## 2024-01-09 DIAGNOSIS — R351 Nocturia: Secondary | ICD-10-CM

## 2024-01-09 DIAGNOSIS — E785 Hyperlipidemia, unspecified: Secondary | ICD-10-CM

## 2024-01-09 DIAGNOSIS — Z0000 Encounter for general adult medical examination without abnormal findings: Secondary | ICD-10-CM

## 2024-01-10 LAB — COMPREHENSIVE METABOLIC PANEL WITH GFR
AG Ratio: 1.6 (calc) (ref 1.0–2.5)
ALT: 20 U/L (ref 9–46)
AST: 15 U/L (ref 10–35)
Albumin: 4.4 g/dL (ref 3.6–5.1)
Alkaline phosphatase (APISO): 70 U/L (ref 35–144)
BUN: 11 mg/dL (ref 7–25)
CO2: 28 mmol/L (ref 20–32)
Calcium: 9.6 mg/dL (ref 8.6–10.3)
Chloride: 100 mmol/L (ref 98–110)
Creat: 0.74 mg/dL (ref 0.70–1.35)
Globulin: 2.7 g/dL (ref 1.9–3.7)
Glucose, Bld: 188 mg/dL — ABNORMAL HIGH (ref 65–99)
Potassium: 5.3 mmol/L (ref 3.5–5.3)
Sodium: 136 mmol/L (ref 135–146)
Total Bilirubin: 1.1 mg/dL (ref 0.2–1.2)
Total Protein: 7.1 g/dL (ref 6.1–8.1)
eGFR: 99 mL/min/1.73m2

## 2024-01-10 LAB — MICROALBUMIN / CREATININE URINE RATIO
Creatinine, Urine: 107 mg/dL (ref 20–320)
Microalb Creat Ratio: 13 mg/g{creat}
Microalb, Ur: 1.4 mg/dL

## 2024-01-10 LAB — TSH: TSH: 4 m[IU]/L (ref 0.40–4.50)

## 2024-01-10 LAB — CBC WITH DIFFERENTIAL/PLATELET
Absolute Lymphocytes: 1258 {cells}/uL (ref 850–3900)
Absolute Monocytes: 281 {cells}/uL (ref 200–950)
Basophils Absolute: 52 {cells}/uL (ref 0–200)
Basophils Relative: 1 %
Eosinophils Absolute: 239 {cells}/uL (ref 15–500)
Eosinophils Relative: 4.6 %
HCT: 48.2 % (ref 39.4–51.1)
Hemoglobin: 15.9 g/dL (ref 13.2–17.1)
MCH: 29.3 pg (ref 27.0–33.0)
MCHC: 33 g/dL (ref 31.6–35.4)
MCV: 88.9 fL (ref 81.4–101.7)
MPV: 9.9 fL (ref 7.5–12.5)
Monocytes Relative: 5.4 %
Neutro Abs: 3370 {cells}/uL (ref 1500–7800)
Neutrophils Relative %: 64.8 %
Platelets: 233 Thousand/uL (ref 140–400)
RBC: 5.42 Million/uL (ref 4.20–5.80)
RDW: 11.8 % (ref 11.0–15.0)
Total Lymphocyte: 24.2 %
WBC: 5.2 Thousand/uL (ref 3.8–10.8)

## 2024-01-10 LAB — LIPID PANEL
Cholesterol: 148 mg/dL
HDL: 57 mg/dL
LDL Cholesterol (Calc): 68 mg/dL
Non-HDL Cholesterol (Calc): 91 mg/dL
Total CHOL/HDL Ratio: 2.6 (calc)
Triglycerides: 142 mg/dL

## 2024-01-10 LAB — HEMOGLOBIN A1C
Hgb A1c MFr Bld: 7.8 % — ABNORMAL HIGH
Mean Plasma Glucose: 177 mg/dL
eAG (mmol/L): 9.8 mmol/L

## 2024-01-10 LAB — PSA: PSA: 0.74 ng/mL

## 2024-01-17 ENCOUNTER — Encounter: Admitting: Family Medicine

## 2024-01-17 ENCOUNTER — Encounter: Payer: Self-pay | Admitting: Family Medicine

## 2024-01-17 ENCOUNTER — Other Ambulatory Visit: Payer: Self-pay | Admitting: Family Medicine

## 2024-01-17 VITALS — BP 130/74 | HR 107 | Ht 70.0 in | Wt 191.5 lb

## 2024-01-17 DIAGNOSIS — Z7985 Long-term (current) use of injectable non-insulin antidiabetic drugs: Secondary | ICD-10-CM

## 2024-01-17 DIAGNOSIS — E1169 Type 2 diabetes mellitus with other specified complication: Secondary | ICD-10-CM | POA: Diagnosis not present

## 2024-01-17 DIAGNOSIS — I10 Essential (primary) hypertension: Secondary | ICD-10-CM | POA: Diagnosis not present

## 2024-01-17 DIAGNOSIS — E785 Hyperlipidemia, unspecified: Secondary | ICD-10-CM

## 2024-01-17 DIAGNOSIS — M1711 Unilateral primary osteoarthritis, right knee: Secondary | ICD-10-CM | POA: Diagnosis not present

## 2024-01-17 DIAGNOSIS — Z0000 Encounter for general adult medical examination without abnormal findings: Secondary | ICD-10-CM

## 2024-01-17 DIAGNOSIS — Z23 Encounter for immunization: Secondary | ICD-10-CM

## 2024-01-17 MED ORDER — METFORMIN HCL ER 500 MG PO TB24: 1000.0000 mg | ORAL_TABLET | Freq: Two times a day (BID) | ORAL | 3 refills | Status: AC

## 2024-01-17 MED ORDER — OZEMPIC (1 MG/DOSE) 4 MG/3ML ~~LOC~~ SOPN: 1.0000 mg | PEN_INJECTOR | SUBCUTANEOUS | 5 refills | Status: AC

## 2024-01-17 NOTE — Progress Notes (Signed)
 "  Subjective:    Patient ID: Philip JINNY Gerome Mickey., male    DOB: Mar 13, 1955, 68 y.o.   MRN: [id number]  Philip Dase. is a 68 y.o. male presenting on 01/17/2024 for Annual Exam   HPI  Discussed the use of AI scribe software for clinical note transcription with the patient, who gave verbal consent to proceed.  History of Present Illness   Philip Forst. is a 68 year old male who presents for a routine follow-up visit.   Hyperlipidemia - Cholesterol levels improved; LDL decreased from 134 to 68 - Currently taking rosuvastatin  5 mg  Preventive health maintenance - Received influenza vaccination recently - Received pneumococcal vaccination last year - Participated in heart scan one year ago - Considering life scan, performed every five to six years to monitor blood flow and other health parameters - Last colon screening in 2022; advised to repeat every three years due to family history  Ophthalmologic evaluation - Recent eye exam with normal results - No evidence of retinopathy  Prostate and thyroid  function - Recent blood tests showed normal PSA levels - Normal thyroid  function - Normal blood counts  CHRONIC HTN: Reports no concern, controlled Current Meds - Amlodipine  10mg  and Telmisartan  80mg    Reports good compliance, took meds today. Tolerating well, w/o complaints. Denies CP, dyspnea, HA, edema, dizziness / lightheadedness  S/p Carpal Tunnel Surgery, Left Dr [facility], surgery on 06/02/22 with good results, still healing, wearing brace     CHRONIC DM, Type 2: - Blood glucose levels stable; most recent reading 7.8, slightly increased from 7.7 four months ago - No changes in foot sensation, numbness, tingling, calluses, or skin sores - Takes metformin  with meals to avoid gastrointestinal discomfort - No adverse effects from semaglutide  - Dietary efforts ongoing; consumes chocolate and fruit at night Current med - Ozempic  1mg  weekly (on PAP  per pharmacy assistance) Metformin  XR 500mg  x 2 = 1000mg  TWICE A DAY DM Eye Exam, [facility], normal no retinopathy  Denies hypoglycemia, polyuria, visual changes, numbness or tingling.  Osteoarthritis Right Knee He has significant bone spurs. Prior Ortho,  Arthroscopic knee surgery 30-40 years ago. Requesting Right Knee Osteoarthritis referral to Dr [facility]     Health Maintenance:  Colonoscopy 04/29/20 negative, repeat 5 years or sooner.  PSA 0.74 negative       05/23/2023    8:36 AM 03/30/2023    8:58 AM 12/29/2022    9:38 AM  Depression screen PHQ 2/9  Decreased Interest 0 0 0  Down, Depressed, Hopeless 0 0 0  PHQ - 2 Score 0 0 0  Altered sleeping 0 0   Tired, decreased energy 0 0   Change in appetite 0 0   Feeling bad or failure about yourself  0 0   Trouble concentrating 0 0   Moving slowly or fidgety/restless 0 0   Suicidal thoughts 0 0   PHQ-9 Score 0  0       Data saved with a previous flowsheet row definition       05/23/2023    8:36 AM 03/30/2023    8:58 AM 12/29/2022    9:38 AM 12/14/2021    2:23 PM  GAD 7 : Generalized Anxiety Score  Nervous, Anxious, on Edge 0 0 0 0  Control/stop worrying 0 0 0 0  Worry too much - different things 0 0 0 0  Trouble relaxing 0 0 0 0  Restless 0 0 0 0  Easily annoyed or irritable 0 0 0 0  Afraid - awful might happen 0 0 0 0  Total GAD 7 Score 0 0 0 0  Anxiety Difficulty    Not difficult at all     Past Medical History:  Diagnosis Date   Allergy    Carpal tunnel syndrome, left 04/2022   Diabetes mellitus type 2 with complications (HCC)    Hyperlipidemia    Hypertension    Sleep apnea    no CPAP   Past Surgical History:  Procedure Laterality Date   CARPAL TUNNEL RELEASE Left 06/02/2022   Procedure: CARPAL TUNNEL RELEASE;  Surgeon: Marchia Drivers, MD;  Location: [facility];  Service: Orthopedics;  Laterality: Left;   COLONOSCOPY  2022   KNEE ARTHROSCOPY Right 1987   cartilege torn   SHOULDER  ARTHROSCOPY WITH BICEPS TENDON REPAIR Left 08/29/2016   Procedure: SHOULDER ARTHROSCOPY WITH BICEPS TENDON REPAIR;  Surgeon: Marchia Drivers, MD;  Location: [facility];  Service: Orthopedics;  Laterality: Left;   SHOULDER ARTHROSCOPY WITH OPEN ROTATOR CUFF REPAIR AND DISTAL CLAVICLE ACROMINECTOMY Left 08/29/2016   Procedure: SHOULDER ARTHROSCOPY WITH OPEN ROTATOR CUFF REPAIR AND DISTAL CLAVICLE ACROMINECTOMY;  Surgeon: Marchia Drivers, MD;  Location: [facility];  Service: Orthopedics;  Laterality: Left;  Sleep Apnea - No CPAP   Social History   Socioeconomic History   Marital status: Married    Spouse name: Jori   Number of children: 3   Years of education: Not on file   Highest education level: Some college, no degree  Occupational History   Not on file  Tobacco Use   Smoking status: Former    Current packs/day: 0.00    Types: Cigarettes    Quit date: 07/28/1974    Years since quitting: 49.5   Smokeless tobacco: Never  Vaping Use   Vaping status: Never Used  Substance and Sexual Activity   Alcohol use: Yes    Alcohol/week: 2.0 standard drinks of alcohol    Types: 2 Cans of beer per week    Comment: bi weekly use per pt.   Drug use: No   Sexual activity: Not on file  Other Topics Concern   Not on file  Social History Narrative   Not on file   Social Drivers of Health   Tobacco Use: Medium Risk (01/17/2024)   Patient History    Smoking Tobacco Use: Former    Smokeless Tobacco Use: Never    Passive Exposure: Not on file  Financial Resource Strain: Low Risk (01/13/2024)   Overall Financial Resource Strain (CARDIA)    Difficulty of Paying Living Expenses: Not hard at all  Food Insecurity: No Food Insecurity (01/13/2024)   Epic    Worried About Radiation Protection Practitioner of Food in the Last Year: Never true    Ran Out of Food in the Last Year: Never true  Transportation Needs: No Transportation Needs (01/13/2024)   Epic    Lack of Transportation (Medical): No     Lack of Transportation (Non-Medical): No  Physical Activity: Unknown (01/13/2024)   Exercise Vital Sign    Days of Exercise per Week: Patient declined    Minutes of Exercise per Session: Not on file  Stress: No Stress Concern Present (01/13/2024)   [employer] of Occupational Health - Occupational Stress Questionnaire    Feeling of Stress: Not at all  Social Connections: Socially Integrated (01/13/2024)   Social Connection and Isolation Panel    Frequency of Communication with Friends and Family: More  than three times a week    Frequency of Social Gatherings with Friends and Family: More than three times a week    Attends Religious Services: More than 4 times per year    Active Member of Clubs or Organizations: Yes    Attends Banker Meetings: More than 4 times per year    Marital Status: Married  Catering Manager Violence: Not on file  Depression (PHQ2-9): Low Risk (05/23/2023)   Depression (PHQ2-9)    PHQ-2 Score: 0  Alcohol Screen: Low Risk (01/13/2024)   Alcohol Screen    Last Alcohol Screening Score (AUDIT): 2  Housing: Unknown (01/13/2024)   Epic    Unable to Pay for Housing in the Last Year: No    Number of Times Moved in the Last Year: Not on file    Homeless in the Last Year: No  Utilities: Not on file  Health Literacy: Not on file   Family History  Problem Relation Age of Onset   Diabetes Mother    Colon cancer Mother    CAD Maternal Grandfather    Heart attack Maternal Grandfather    Cancer Maternal Grandfather    Colon cancer Maternal Grandfather    Esophageal cancer Neg Hx    Rectal cancer Neg Hx    Stomach cancer Neg Hx    Current Outpatient Medications on File Prior to Visit  Medication Sig   amLODipine  (NORVASC ) 10 MG tablet Take 1 tablet (10 mg total) by mouth daily.   aspirin EC 81 MG tablet Take 81 mg by mouth daily. Swallow whole.   Blood Glucose Monitoring Suppl (ONETOUCH VERIO) w/Device KIT 1 Device by Does not apply route 3  (three) times daily.   glucose blood (ONETOUCH VERIO) test strip Check blood sugar 6 x daily   Multiple Vitamin (MULTIVITAMIN) tablet Take 1 tablet by mouth daily.   OneTouch Delica Lancets 33G MISC 1 Device by Does not apply route 3 (three) times daily as needed.   rosuvastatin  (CRESTOR ) 5 MG tablet TAKE 1 TABLET BY MOUTH DAILY   telmisartan  (MICARDIS ) 80 MG tablet Take 1 tablet (80 mg total) by mouth daily.   No current facility-administered medications on file prior to visit.    Review of Systems  Constitutional:  Negative for activity change, appetite change, chills, diaphoresis, fatigue and fever.  HENT:  Negative for congestion and hearing loss.   Eyes:  Negative for visual disturbance.  Respiratory:  Negative for cough, chest tightness, shortness of breath and wheezing.   Cardiovascular:  Negative for chest pain, palpitations and leg swelling.  Gastrointestinal:  Negative for abdominal pain, constipation, diarrhea, nausea and vomiting.  Genitourinary:  Negative for dysuria, frequency and hematuria.  Musculoskeletal:  Negative for arthralgias and neck pain.  Skin:  Negative for rash.  Neurological:  Negative for dizziness, weakness, light-headedness, numbness and headaches.  Hematological:  Negative for adenopathy.  Psychiatric/Behavioral:  Negative for behavioral problems, dysphoric mood and sleep disturbance.    Per HPI unless specifically indicated above     Objective:    BP 130/74 (BP Location: Left Arm, Patient Position: Sitting, Cuff Size: Normal)   Pulse (!) 107   Ht 5' 10 (1.778 m)   Wt 191 lb 8 oz (86.9 kg)   SpO2 98%   BMI 27.48 kg/m   Wt Readings from Last 3 Encounters:  01/17/24 191 lb 8 oz (86.9 kg)  09/05/23 199 lb 8 oz (90.5 kg)  05/23/23 200 lb 4 oz (90.8 kg)  Physical Exam Vitals and nursing note reviewed.  Constitutional:      General: He is not in acute distress.    Appearance: He is well-developed. He is not diaphoretic.     Comments:  Well-appearing, comfortable, cooperative  HENT:     Head: Normocephalic and atraumatic.  Eyes:     General:        Right eye: No discharge.        Left eye: No discharge.     Conjunctiva/sclera: Conjunctivae normal.     Pupils: Pupils are equal, round, and reactive to light.  Neck:     Thyroid : No thyromegaly.     Vascular: No carotid bruit.  Cardiovascular:     Rate and Rhythm: Normal rate and regular rhythm.     Pulses: Normal pulses.     Heart sounds: Normal heart sounds. No murmur heard. Pulmonary:     Effort: Pulmonary effort is normal. No respiratory distress.     Breath sounds: Normal breath sounds. No wheezing or rales.  Abdominal:     General: Bowel sounds are normal. There is no distension.     Palpations: Abdomen is soft. There is no mass.     Tenderness: There is no abdominal tenderness.  Musculoskeletal:        General: No tenderness. Normal range of motion.     Cervical back: Normal range of motion and neck supple.     Right lower leg: No edema.     Left lower leg: No edema.     Comments: Upper / Lower Extremities: - Normal muscle tone, strength bilateral upper extremities 5/5, lower extremities 5/5  Lymphadenopathy:     Cervical: No cervical adenopathy.  Skin:    General: Skin is warm and dry.     Findings: No erythema or rash.  Neurological:     Mental Status: He is alert and oriented to person, place, and time.     Comments: Distal sensation intact to light touch all extremities  Psychiatric:        Mood and Affect: Mood normal.        Behavior: Behavior normal.        Thought Content: Thought content normal.     Comments: Well groomed, good eye contact, normal speech and thoughts     Diabetic Foot Exam - Simple   Simple Foot Form Diabetic Foot exam was performed with the following findings: Yes 01/17/2024  9:51 AM  Visual Inspection See comments: Yes Sensation Testing Intact to touch and monofilament testing bilaterally: Yes Pulse  Check Posterior Tibialis and Dorsalis pulse intact bilaterally: Yes Comments Left 2nd toe with joint deformity bulkiness. No significant callus or ulceration. Intact monofilament.       Results for orders placed or performed in visit on 01/09/24  Comprehensive metabolic panel with GFR   Collection Time: 01/09/24  7:58 AM  Result Value Ref Range   Glucose, Bld 188 (H) 65 - 99 mg/dL   BUN 11 7 - 25 mg/dL   Creat 9.25 9.29 - 8.64 mg/dL   eGFR 99 > OR = 60 fO/fpw/8.26f7   BUN/Creatinine Ratio SEE NOTE: 6 - 22 (calc)   Sodium 136 135 - 146 mmol/L   Potassium 5.3 3.5 - 5.3 mmol/L   Chloride 100 98 - 110 mmol/L   CO2 28 20 - 32 mmol/L   Calcium  9.6 8.6 - 10.3 mg/dL   Total Protein 7.1 6.1 - 8.1 g/dL   Albumin 4.4 3.6 - 5.1 g/dL  Globulin 2.7 1.9 - 3.7 g/dL (calc)   AG Ratio 1.6 1.0 - 2.5 (calc)   Total Bilirubin 1.1 0.2 - 1.2 mg/dL   Alkaline phosphatase (APISO) 70 35 - 144 U/L   AST 15 10 - 35 U/L   ALT 20 9 - 46 U/L  TSH   Collection Time: 01/09/24  7:58 AM  Result Value Ref Range   TSH 4.00 0.40 - 4.50 mIU/L  Microalbumin / creatinine urine ratio   Collection Time: 01/09/24  7:58 AM  Result Value Ref Range   Creatinine, Urine 107 20 - 320 mg/dL   Microalb, Ur 1.4 mg/dL   Microalb Creat Ratio 13 <30 mg/g creat  PSA   Collection Time: 01/09/24  7:58 AM  Result Value Ref Range   PSA 0.74 < OR = 4.00 ng/mL  CBC with Differential/Platelet   Collection Time: 01/09/24  7:58 AM  Result Value Ref Range   WBC 5.2 3.8 - 10.8 Thousand/uL   RBC 5.42 4.20 - 5.80 Million/uL   Hemoglobin 15.9 13.2 - 17.1 g/dL   HCT 51.7 60.5 - 48.8 %   MCV 88.9 81.4 - 101.7 fL   MCH 29.3 27.0 - 33.0 pg   MCHC 33.0 31.6 - 35.4 g/dL   RDW 88.1 88.9 - 84.9 %   Platelets 233 140 - 400 Thousand/uL   MPV 9.9 7.5 - 12.5 fL   Neutro Abs 3,370 1,500 - 7,800 cells/uL   Absolute Lymphocytes 1,258 850 - 3,900 cells/uL   Absolute Monocytes 281 200 - 950 cells/uL   Eosinophils Absolute 239 15 - 500  cells/uL   Basophils Absolute 52 0 - 200 cells/uL   Neutrophils Relative % 64.8 %   Total Lymphocyte 24.2 %   Monocytes Relative 5.4 %   Eosinophils Relative 4.6 %   Basophils Relative 1.0 %  Hemoglobin A1c   Collection Time: 01/09/24  7:58 AM  Result Value Ref Range   Hgb A1c MFr Bld 7.8 (H) <5.7 %   Mean Plasma Glucose 177 mg/dL   eAG (mmol/L) 9.8 mmol/L  Lipid panel   Collection Time: 01/09/24  7:58 AM  Result Value Ref Range   Cholesterol 148 <200 mg/dL   HDL 57 > OR = 40 mg/dL   Triglycerides 857 <849 mg/dL   LDL Cholesterol (Calc) 68 mg/dL (calc)   Total CHOL/HDL Ratio 2.6 <5.0 (calc)   Non-HDL Cholesterol (Calc) 91 <869 mg/dL (calc)      Assessment & Plan:   Problem List Items Addressed This Visit     Hyperlipidemia due to type 2 diabetes mellitus (HCC)   Relevant Medications   metFORMIN  (GLUCOPHAGE -XR) 500 MG 24 hr tablet   Semaglutide , 1 MG/DOSE, (OZEMPIC , 1 MG/DOSE,) 4 MG/3ML SOPN   Hypertension   Primary osteoarthritis of right knee   Type 2 diabetes mellitus with other specified complication (HCC)   Relevant Medications   metFORMIN  (GLUCOPHAGE -XR) 500 MG 24 hr tablet   Semaglutide , 1 MG/DOSE, (OZEMPIC , 1 MG/DOSE,) 4 MG/3ML SOPN   Other Visit Diagnoses       Annual physical exam    -  Primary     Flu vaccine need       Relevant Orders   Flu vaccine HIGH DOSE PF(Fluzone Trivalent) (Completed)     Long-term current use of injectable noninsulin antidiabetic medication            Updated Health Maintenance information Reviewed recent lab results with patient Encouraged improvement to lifestyle with diet and exercise Goal of  weight loss  Adult Wellness Visit Routine wellness visit with stable health status. Recent flu vaccination administered. Pneumonia vaccination effective for five or more years. Eye exam showed no retinopathy. Colon screening due in 2027. Blood pressure 130/74. Weight decreasing. Cholesterol levels improved significantly. PSA, thyroid ,  blood counts, kidney, and liver function normal. Glucose level slightly increased but improved from previous levels. - Continue metformin , semaglutide , telmisartan , and rosuvastatin . - Added future refills for metformin  and semaglutide . - Scheduled follow-up in six months.  Type 2 diabetes mellitus with other specified complication Hyperlipidemia Glucose level at 7.8, slightly increased but improved from previous levels. Managed with semaglutide  and metformin . No significant side effects. Patient prefers current semaglutide  dose. - Continue semaglutide  1 mg weekly. - Continue metformin  with future refills. - Encouraged lifestyle and dietary improvements.  Hyperlipidemia On Statin therapy  Primary hypertension Well-controlled with current medication regimen. Blood pressure 130/74. - Continue telmisartan  and baby aspirin.  Primary osteoarthritis of right knee Previously evaluated with a scope approximately 40 years ago. Patient considering further intervention. - Referred to orthopedic specialist for evaluation.      Future referral Right Knee Osteoarthritis referral to Dr [facility]  Orders Placed This Encounter  Procedures   Flu vaccine HIGH DOSE PF(Fluzone Trivalent)    Meds ordered this encounter  Medications   metFORMIN  (GLUCOPHAGE -XR) 500 MG 24 hr tablet    Sig: Take 2 tablets (1,000 mg total) by mouth 2 (two) times daily with a meal.    Dispense:  360 tablet    Refill:  3    Add future refills   Semaglutide , 1 MG/DOSE, (OZEMPIC , 1 MG/DOSE,) 4 MG/3ML SOPN    Sig: Inject 1 mg as directed once a week.    Dispense:  3 mL    Refill:  5     Follow up plan: Return for 6 month follow-up DM A1c.  Marsa Officer, DO [facility] 01/17/2024, 9:50 AM  "

## 2024-01-17 NOTE — Patient Instructions (Addendum)
 Thank you for coming to the office today.  Add refills Ozempic  1mg  weekly  Keep improving lifestyle diet  Recent Labs    03/30/23 0905 09/05/23 1508 01/09/24 0758  HGBA1C 8.9* 7.7* 7.8*   Cholesterol much improved  Check on Rosuvastatin  Crestor  5mg   Added refills.  Next Colonoscopy 2027  [facility] Address: [address] [place], [place] [address] Phone: [phone] Fax: [phone]   Please schedule a Follow-up Appointment to: Return for 6 month follow-up DM A1c.  If you have any other questions or concerns, please feel free to call the office or send a message through MyChart. You may also schedule an earlier appointment if necessary.  Additionally, you may be receiving a survey about your experience at our office within a few days to 1 week by e-mail or mail. We value your feedback.  Marsa Officer, DO [facility] [place], [place]

## 2024-01-25 NOTE — Progress Notes (Signed)
 Philip Ryan.                                          MRN: [id number]   01/25/2024   The [facility] Quality Team Specialist reviewed this patient medical record for the purposes of chart review for care gap closure. The following were reviewed: abstraction for care gap closure-kidney health evaluation for diabetes:eGFR  and uACR.    [facility] Quality Team

## 2024-03-11 ENCOUNTER — Other Ambulatory Visit

## 2024-07-16 ENCOUNTER — Ambulatory Visit: Admitting: Family Medicine
